# Patient Record
Sex: Female | Born: 1965 | Race: White | Hispanic: No | Marital: Married | Smoking: Former smoker
Health system: Southern US, Community
[De-identification: ages and names within clinical notes are randomized; demographics above are authoritative.]

## PROBLEM LIST (undated history)

## (undated) DIAGNOSIS — N393 Stress incontinence (female) (male): Secondary | ICD-10-CM

## (undated) DIAGNOSIS — N898 Other specified noninflammatory disorders of vagina: Secondary | ICD-10-CM

## (undated) DIAGNOSIS — B009 Herpesviral infection, unspecified: Secondary | ICD-10-CM

## (undated) DIAGNOSIS — I8390 Asymptomatic varicose veins of unspecified lower extremity: Secondary | ICD-10-CM

## (undated) DIAGNOSIS — G2581 Restless legs syndrome: Secondary | ICD-10-CM

## (undated) DIAGNOSIS — T7840XA Allergy, unspecified, initial encounter: Secondary | ICD-10-CM

## (undated) DIAGNOSIS — N816 Rectocele: Secondary | ICD-10-CM

## (undated) DIAGNOSIS — H269 Unspecified cataract: Secondary | ICD-10-CM

## (undated) DIAGNOSIS — K5900 Constipation, unspecified: Secondary | ICD-10-CM

## (undated) DIAGNOSIS — R0602 Shortness of breath: Secondary | ICD-10-CM

## (undated) DIAGNOSIS — K219 Gastro-esophageal reflux disease without esophagitis: Secondary | ICD-10-CM

## (undated) DIAGNOSIS — R102 Pelvic and perineal pain: Secondary | ICD-10-CM

## (undated) DIAGNOSIS — F329 Major depressive disorder, single episode, unspecified: Secondary | ICD-10-CM

## (undated) DIAGNOSIS — IMO0002 Reserved for concepts with insufficient information to code with codable children: Secondary | ICD-10-CM

## (undated) DIAGNOSIS — F32A Depression, unspecified: Secondary | ICD-10-CM

## (undated) DIAGNOSIS — E119 Type 2 diabetes mellitus without complications: Secondary | ICD-10-CM

## (undated) DIAGNOSIS — K649 Unspecified hemorrhoids: Secondary | ICD-10-CM

## (undated) DIAGNOSIS — G43909 Migraine, unspecified, not intractable, without status migrainosus: Secondary | ICD-10-CM

## (undated) HISTORY — DX: Constipation, unspecified: K59.00

## (undated) HISTORY — DX: Allergy, unspecified, initial encounter: T78.40XA

## (undated) HISTORY — DX: Type 2 diabetes mellitus without complications: E11.9

## (undated) HISTORY — DX: Gastro-esophageal reflux disease without esophagitis: K21.9

## (undated) HISTORY — DX: Rectocele: N81.6

## (undated) HISTORY — DX: Asymptomatic varicose veins of unspecified lower extremity: I83.90

## (undated) HISTORY — PX: REPLACEMENT TOTAL KNEE BILATERAL: SUR1225

## (undated) HISTORY — PX: ABDOMINAL HYSTERECTOMY: SHX81

## (undated) HISTORY — DX: Herpesviral infection, unspecified: B00.9

## (undated) HISTORY — PX: NISSEN FUNDOPLICATION: SHX2091

## (undated) HISTORY — DX: Unspecified hemorrhoids: K64.9

## (undated) HISTORY — DX: Other specified noninflammatory disorders of vagina: N89.8

## (undated) HISTORY — DX: Migraine, unspecified, not intractable, without status migrainosus: G43.909

## (undated) HISTORY — PX: CATARACT EXTRACTION: SUR2

## (undated) HISTORY — DX: Stress incontinence (female) (male): N39.3

## (undated) HISTORY — DX: Unspecified cataract: H26.9

## (undated) HISTORY — DX: Reserved for concepts with insufficient information to code with codable children: IMO0002

## (undated) HISTORY — PX: SEPTOPLASTY: SUR1290

## (undated) HISTORY — PX: OTHER SURGICAL HISTORY: SHX169

## (undated) HISTORY — PX: TUBAL LIGATION: SHX77

## (undated) HISTORY — PX: TONSILLECTOMY: SUR1361

## (undated) HISTORY — PX: BACK SURGERY: SHX140

## (undated) HISTORY — DX: Pelvic and perineal pain: R10.2

## (undated) HISTORY — PX: SINOSCOPY: SHX187

---

## 2001-03-05 ENCOUNTER — Ambulatory Visit (HOSPITAL_COMMUNITY): Admission: RE | Admit: 2001-03-05 | Discharge: 2001-03-05 | Payer: Self-pay | Admitting: General Surgery

## 2001-03-05 ENCOUNTER — Encounter: Payer: Self-pay | Admitting: General Surgery

## 2001-06-08 ENCOUNTER — Ambulatory Visit (HOSPITAL_COMMUNITY): Admission: RE | Admit: 2001-06-08 | Discharge: 2001-06-08 | Payer: Self-pay | Admitting: Pulmonary Disease

## 2001-08-03 ENCOUNTER — Encounter: Payer: Self-pay | Admitting: Orthopedic Surgery

## 2001-08-03 ENCOUNTER — Ambulatory Visit (HOSPITAL_COMMUNITY): Admission: RE | Admit: 2001-08-03 | Discharge: 2001-08-03 | Payer: Self-pay | Admitting: Orthopedic Surgery

## 2002-03-16 ENCOUNTER — Ambulatory Visit (HOSPITAL_COMMUNITY): Admission: RE | Admit: 2002-03-16 | Discharge: 2002-03-16 | Payer: Self-pay | Admitting: Internal Medicine

## 2002-05-18 ENCOUNTER — Ambulatory Visit (HOSPITAL_COMMUNITY): Admission: RE | Admit: 2002-05-18 | Discharge: 2002-05-18 | Payer: Self-pay | Admitting: Neurological Surgery

## 2002-05-18 ENCOUNTER — Encounter: Payer: Self-pay | Admitting: Neurological Surgery

## 2002-06-17 ENCOUNTER — Ambulatory Visit (HOSPITAL_BASED_OUTPATIENT_CLINIC_OR_DEPARTMENT_OTHER): Admission: RE | Admit: 2002-06-17 | Discharge: 2002-06-17 | Payer: Self-pay | Admitting: Specialist

## 2002-06-25 ENCOUNTER — Encounter (HOSPITAL_COMMUNITY): Admission: RE | Admit: 2002-06-25 | Discharge: 2002-07-25 | Payer: Self-pay | Admitting: *Deleted

## 2002-07-27 ENCOUNTER — Encounter (HOSPITAL_COMMUNITY): Admission: RE | Admit: 2002-07-27 | Discharge: 2002-08-26 | Payer: Self-pay | Admitting: *Deleted

## 2002-09-17 ENCOUNTER — Ambulatory Visit (HOSPITAL_COMMUNITY): Admission: RE | Admit: 2002-09-17 | Discharge: 2002-09-17 | Payer: Self-pay | Admitting: *Deleted

## 2002-09-17 ENCOUNTER — Encounter: Payer: Self-pay | Admitting: Specialist

## 2002-11-11 ENCOUNTER — Ambulatory Visit (HOSPITAL_COMMUNITY): Admission: RE | Admit: 2002-11-11 | Discharge: 2002-11-11 | Payer: Self-pay | Admitting: Pulmonary Disease

## 2002-12-15 ENCOUNTER — Encounter (HOSPITAL_COMMUNITY): Admission: RE | Admit: 2002-12-15 | Discharge: 2003-01-14 | Payer: Self-pay | Admitting: Pulmonary Disease

## 2003-09-01 ENCOUNTER — Ambulatory Visit (HOSPITAL_COMMUNITY): Admission: RE | Admit: 2003-09-01 | Discharge: 2003-09-01 | Payer: Self-pay | Admitting: Pulmonary Disease

## 2003-09-01 ENCOUNTER — Emergency Department (HOSPITAL_COMMUNITY): Admission: EM | Admit: 2003-09-01 | Discharge: 2003-09-01 | Payer: Self-pay | Admitting: Emergency Medicine

## 2003-09-30 ENCOUNTER — Ambulatory Visit (HOSPITAL_BASED_OUTPATIENT_CLINIC_OR_DEPARTMENT_OTHER): Admission: RE | Admit: 2003-09-30 | Discharge: 2003-09-30 | Payer: Self-pay | Admitting: Specialist

## 2003-10-06 ENCOUNTER — Encounter (HOSPITAL_COMMUNITY): Admission: RE | Admit: 2003-10-06 | Discharge: 2003-11-05 | Payer: Self-pay | Admitting: *Deleted

## 2003-12-08 ENCOUNTER — Ambulatory Visit (HOSPITAL_COMMUNITY): Admission: RE | Admit: 2003-12-08 | Discharge: 2003-12-08 | Payer: Self-pay | Admitting: Pulmonary Disease

## 2004-01-18 ENCOUNTER — Ambulatory Visit (HOSPITAL_COMMUNITY): Admission: RE | Admit: 2004-01-18 | Discharge: 2004-01-18 | Payer: Self-pay | Admitting: Pulmonary Disease

## 2004-12-19 ENCOUNTER — Ambulatory Visit: Payer: Self-pay | Admitting: Orthopedic Surgery

## 2004-12-20 ENCOUNTER — Encounter: Payer: Self-pay | Admitting: Orthopedic Surgery

## 2005-02-08 ENCOUNTER — Ambulatory Visit (HOSPITAL_COMMUNITY): Admission: RE | Admit: 2005-02-08 | Discharge: 2005-02-08 | Payer: Self-pay | Admitting: Obstetrics and Gynecology

## 2005-02-27 ENCOUNTER — Ambulatory Visit: Payer: Self-pay | Admitting: Orthopedic Surgery

## 2005-02-27 ENCOUNTER — Ambulatory Visit (HOSPITAL_COMMUNITY): Admission: RE | Admit: 2005-02-27 | Discharge: 2005-02-27 | Payer: Self-pay | Admitting: Obstetrics and Gynecology

## 2005-05-30 ENCOUNTER — Ambulatory Visit: Payer: Self-pay | Admitting: Orthopedic Surgery

## 2005-09-03 ENCOUNTER — Emergency Department (HOSPITAL_COMMUNITY): Admission: EM | Admit: 2005-09-03 | Discharge: 2005-09-03 | Payer: Self-pay | Admitting: Emergency Medicine

## 2005-12-12 ENCOUNTER — Ambulatory Visit: Payer: Self-pay | Admitting: Orthopedic Surgery

## 2006-02-17 ENCOUNTER — Ambulatory Visit (HOSPITAL_COMMUNITY): Admission: RE | Admit: 2006-02-17 | Discharge: 2006-02-17 | Payer: Self-pay | Admitting: Obstetrics and Gynecology

## 2006-02-20 ENCOUNTER — Encounter: Admission: RE | Admit: 2006-02-20 | Discharge: 2006-02-20 | Payer: Self-pay | Admitting: Pulmonary Disease

## 2006-04-24 ENCOUNTER — Ambulatory Visit: Payer: Self-pay | Admitting: Orthopedic Surgery

## 2006-07-03 ENCOUNTER — Ambulatory Visit: Payer: Self-pay | Admitting: Orthopedic Surgery

## 2006-07-15 ENCOUNTER — Ambulatory Visit (HOSPITAL_COMMUNITY): Admission: RE | Admit: 2006-07-15 | Discharge: 2006-07-15 | Payer: Self-pay | Admitting: Pulmonary Disease

## 2006-08-26 ENCOUNTER — Encounter: Payer: Self-pay | Admitting: Orthopedic Surgery

## 2006-08-26 ENCOUNTER — Encounter (INDEPENDENT_AMBULATORY_CARE_PROVIDER_SITE_OTHER): Payer: Self-pay | Admitting: Specialist

## 2006-08-26 ENCOUNTER — Inpatient Hospital Stay (HOSPITAL_COMMUNITY): Admission: RE | Admit: 2006-08-26 | Discharge: 2006-08-29 | Payer: Self-pay | Admitting: Orthopedic Surgery

## 2006-08-29 ENCOUNTER — Ambulatory Visit: Payer: Self-pay | Admitting: Orthopedic Surgery

## 2006-09-01 ENCOUNTER — Ambulatory Visit: Payer: Self-pay | Admitting: Orthopedic Surgery

## 2006-09-08 ENCOUNTER — Ambulatory Visit: Payer: Self-pay | Admitting: Orthopedic Surgery

## 2006-09-22 ENCOUNTER — Ambulatory Visit: Payer: Self-pay | Admitting: Orthopedic Surgery

## 2006-09-24 ENCOUNTER — Encounter (HOSPITAL_COMMUNITY): Admission: RE | Admit: 2006-09-24 | Discharge: 2006-10-24 | Payer: Self-pay | Admitting: Orthopedic Surgery

## 2006-10-23 ENCOUNTER — Ambulatory Visit: Payer: Self-pay | Admitting: Orthopedic Surgery

## 2006-10-28 ENCOUNTER — Encounter (HOSPITAL_COMMUNITY): Admission: RE | Admit: 2006-10-28 | Discharge: 2006-11-24 | Payer: Self-pay | Admitting: Orthopedic Surgery

## 2006-11-21 ENCOUNTER — Encounter (INDEPENDENT_AMBULATORY_CARE_PROVIDER_SITE_OTHER): Payer: Self-pay | Admitting: *Deleted

## 2006-11-21 ENCOUNTER — Inpatient Hospital Stay (HOSPITAL_COMMUNITY): Admission: RE | Admit: 2006-11-21 | Discharge: 2006-11-24 | Payer: Self-pay | Admitting: Orthopedic Surgery

## 2006-11-21 ENCOUNTER — Ambulatory Visit: Payer: Self-pay | Admitting: Orthopedic Surgery

## 2006-12-01 ENCOUNTER — Ambulatory Visit: Payer: Self-pay | Admitting: Orthopedic Surgery

## 2006-12-08 ENCOUNTER — Ambulatory Visit: Payer: Self-pay | Admitting: Orthopedic Surgery

## 2006-12-22 ENCOUNTER — Ambulatory Visit: Payer: Self-pay | Admitting: Orthopedic Surgery

## 2006-12-24 ENCOUNTER — Encounter (HOSPITAL_COMMUNITY): Admission: RE | Admit: 2006-12-24 | Discharge: 2007-01-23 | Payer: Self-pay | Admitting: Orthopedic Surgery

## 2006-12-29 ENCOUNTER — Ambulatory Visit: Payer: Self-pay | Admitting: Orthopedic Surgery

## 2007-01-30 ENCOUNTER — Ambulatory Visit: Admission: RE | Admit: 2007-01-30 | Discharge: 2007-01-30 | Payer: Self-pay | Admitting: Orthopedic Surgery

## 2007-02-23 ENCOUNTER — Ambulatory Visit (HOSPITAL_COMMUNITY): Admission: RE | Admit: 2007-02-23 | Discharge: 2007-02-23 | Payer: Self-pay | Admitting: Obstetrics and Gynecology

## 2007-02-25 ENCOUNTER — Ambulatory Visit (HOSPITAL_COMMUNITY): Admission: RE | Admit: 2007-02-25 | Discharge: 2007-02-25 | Payer: Self-pay | Admitting: Obstetrics and Gynecology

## 2007-03-02 ENCOUNTER — Ambulatory Visit: Payer: Self-pay | Admitting: Orthopedic Surgery

## 2007-03-26 ENCOUNTER — Ambulatory Visit: Payer: Self-pay | Admitting: Orthopedic Surgery

## 2007-04-01 ENCOUNTER — Ambulatory Visit (HOSPITAL_COMMUNITY): Admission: RE | Admit: 2007-04-01 | Discharge: 2007-04-01 | Payer: Self-pay | Admitting: Orthopedic Surgery

## 2007-04-01 ENCOUNTER — Encounter: Payer: Self-pay | Admitting: Orthopedic Surgery

## 2007-04-28 ENCOUNTER — Ambulatory Visit: Payer: Self-pay | Admitting: Orthopedic Surgery

## 2007-04-29 ENCOUNTER — Encounter: Payer: Self-pay | Admitting: Orthopedic Surgery

## 2007-07-07 DIAGNOSIS — J45909 Unspecified asthma, uncomplicated: Secondary | ICD-10-CM | POA: Insufficient documentation

## 2007-09-03 ENCOUNTER — Ambulatory Visit: Payer: Self-pay | Admitting: Orthopedic Surgery

## 2007-10-21 ENCOUNTER — Telehealth: Payer: Self-pay | Admitting: Orthopedic Surgery

## 2007-10-26 ENCOUNTER — Encounter: Payer: Self-pay | Admitting: Orthopedic Surgery

## 2008-01-05 ENCOUNTER — Ambulatory Visit (HOSPITAL_COMMUNITY): Admission: RE | Admit: 2008-01-05 | Discharge: 2008-01-05 | Payer: Self-pay | Admitting: Pulmonary Disease

## 2008-03-02 ENCOUNTER — Ambulatory Visit (HOSPITAL_COMMUNITY): Admission: RE | Admit: 2008-03-02 | Discharge: 2008-03-02 | Payer: Self-pay | Admitting: Obstetrics and Gynecology

## 2008-03-29 ENCOUNTER — Encounter: Payer: Self-pay | Admitting: Orthopedic Surgery

## 2009-02-28 ENCOUNTER — Encounter: Payer: Self-pay | Admitting: Orthopedic Surgery

## 2009-02-28 ENCOUNTER — Ambulatory Visit (HOSPITAL_COMMUNITY): Admission: RE | Admit: 2009-02-28 | Discharge: 2009-02-28 | Payer: Self-pay | Admitting: Pulmonary Disease

## 2009-03-08 ENCOUNTER — Ambulatory Visit (HOSPITAL_COMMUNITY): Admission: RE | Admit: 2009-03-08 | Discharge: 2009-03-08 | Payer: Self-pay | Admitting: Pulmonary Disease

## 2009-06-07 ENCOUNTER — Ambulatory Visit: Payer: Self-pay | Admitting: Orthopedic Surgery

## 2009-06-07 DIAGNOSIS — Z96659 Presence of unspecified artificial knee joint: Secondary | ICD-10-CM | POA: Insufficient documentation

## 2009-06-07 DIAGNOSIS — IMO0002 Reserved for concepts with insufficient information to code with codable children: Secondary | ICD-10-CM | POA: Insufficient documentation

## 2009-06-07 DIAGNOSIS — M1710 Unilateral primary osteoarthritis, unspecified knee: Secondary | ICD-10-CM

## 2009-06-22 ENCOUNTER — Telehealth: Payer: Self-pay | Admitting: Orthopedic Surgery

## 2009-12-26 ENCOUNTER — Ambulatory Visit (HOSPITAL_COMMUNITY): Admission: RE | Admit: 2009-12-26 | Discharge: 2009-12-26 | Payer: Self-pay | Admitting: Pulmonary Disease

## 2010-02-05 ENCOUNTER — Ambulatory Visit (HOSPITAL_COMMUNITY): Admission: RE | Admit: 2010-02-05 | Discharge: 2010-02-05 | Payer: Self-pay | Admitting: Pulmonary Disease

## 2010-04-02 ENCOUNTER — Ambulatory Visit (HOSPITAL_COMMUNITY): Admission: RE | Admit: 2010-04-02 | Discharge: 2010-04-02 | Payer: Self-pay | Admitting: Obstetrics and Gynecology

## 2010-06-13 ENCOUNTER — Ambulatory Visit: Payer: Self-pay | Admitting: Orthopedic Surgery

## 2010-12-27 NOTE — Assessment & Plan Note (Signed)
Summary: yrly xr tka/st bcbs/bsf   Visit Type:  Follow-up  CC:  bilateral knee replacement.  History of Present Illness: I saw [name] in the office today for a 1 year  followup visit.  She is a 45 years old woman with the complaint of:  bilateral knee replacements.  4 years post op   Right knee TKA 11/21/2006.  Left knee TKA 08/26/2006.  Routine followup no complaints patient says her knees are doing great   Allergies: 1)  ! Hydrocodone 2)  ! Pcn 3)  ! Sulfa 4)  ! * Nystatin 5)  ! * Iv Aminophylline 6)  ! Bactrim  Physical Exam  Additional Exam:  Normal Appearance, Oriented x 3, Mood normal  bilateral knees:  Inspection normal  Palpation normal  ROM 125 degrees  Stability was normal  Motor grade was 5 Skin was intact no rash     Impression & Recommendations:  Problem # 1:  TOTAL KNEE FOLLOW-UP (ICD-V43.65)  Orders: Est. Patient Level III (04540) Knees  x-ray bilateral ([id number])  Problem # 2:  KNEE, ARTHRITIS, DEGEN./OSTEO (ICD-715.96)  The following medications were removed from the medication list:    Ultram 50 Mg Tabs (Tramadol hcl) .Marland Kitchen... 1q4hrs  Orders: Est. Patient Level III (82956) Knees  x-ray bilateral ([id number])  Medications Added to Medication List This Visit: 1)  Topamax 200 Mg Tabs (topiramate)  .Marland Kitchen.. 1bid  Patient Instructions: 1)  Please schedule a follow-up appointment in 1 year. 2)  bilateral knee films in 1 year   Appended Document: yrly xr tka/st bcbs/bsf x-ray report bilateral knee films  The  alignment was normal, PTF reduced without tilt or subluxation, no evidence of loosening.  IMPRESSION: normal appearance of the implant

## 2011-02-26 ENCOUNTER — Ambulatory Visit (HOSPITAL_COMMUNITY)
Admission: RE | Admit: 2011-02-26 | Discharge: 2011-02-26 | Disposition: A | Discharge status: Home / Self Care | Payer: BC Managed Care – PPO | Source: Ambulatory Visit | Attending: Pulmonary Disease | Admitting: Pulmonary Disease

## 2011-02-26 ENCOUNTER — Other Ambulatory Visit (HOSPITAL_COMMUNITY): Payer: Self-pay | Admitting: Pulmonary Disease

## 2011-02-26 DIAGNOSIS — M549 Dorsalgia, unspecified: Secondary | ICD-10-CM

## 2011-02-26 DIAGNOSIS — M545 Low back pain, unspecified: Secondary | ICD-10-CM | POA: Insufficient documentation

## 2011-04-12 NOTE — H&P (Signed)
NAMEKORY, PANJWANI                ACCOUNT NO.:  [id number]   MEDICAL RECORD NO.:  [id number]          PATIENT TYPE:  AMB   LOCATION:  DAY                           FACILITY:  [facility]   PHYSICIAN:  Vickki Hearing, M.D.DATE OF BIRTH:  [birth year]   DATE OF ADMISSION:  DATE OF DISCHARGE:  LH                                HISTORY & PHYSICAL   CHIEF COMPLAINT:  Left knee pain.   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  Unfortunately, her knees have failed her at an early age.  She complains of severe pain which has been unrelieved by anti-inflammatory  medications which included Naprosyn, Arthrotec other various anti-  inflammatories.  She developed GI bleeding from anti-inflammatories even on  Arthrotec.  She was treated with surgical arthroscopy at which time it was  noted that she had bone to bone changes in [id number].  At that time Dr.  Thomasena Edis did a left knee arthroscopy and found grade III changes and IV  changes on the femoral trochlea, grade III changes medial compartment, grade  II changes in the patella.  After this she had several cortisone injections  and I believe also had Synvisc injections.  She was treated with an unloader  arthritis brace and still failed therapy.  She was treated with Ultracet and  on occasion Vicodin for pain and has failed all of these treatments.  On the  most recent film that we have on her knee she had a varus deformity mild to  moderate with complete loss of joint space on the medial side with  osteophyte formation. I did get Dr. Thomasena Edis to look at her again and review  her situation and he agreed with me that despite her age of 5 the best  surgical treatment for her at this time would be knee replacement.  Interestingly her right knee is just as bad, however, based on her pulmonary  problems we have opted to do one knee at a  time.   She has seen Dr. Juanetta Gosling in consultation regarding her respiratory illnesses  in the past.  She has had pulmonary function tests and has essentially been  cleared for surgery.   This patient has undergone the informed consent process.  We have discussed  her diagnosis of osteoarthritis, treatment plan for total knee replacement  of the right knee, alternative treatments which have been tried and could be  continued such as bracing and injections, risks and benefits of the surgery.  Primary benefit is pain relief.  Secondary benefit though less reliable  increased function and motion in the knee.  She understands the finite life  span of these implants and understands that she may need one or two  revisions in her lifetime.  She understands that she will need to be out of  work for a significant amount of time which can only be estimated and not  guarantee.  She also understands that she will need approximately 12 weeks  before the  second knee can be done.   ALLERGIES:  THE PATIENT IS ALLERGIC TO PENICILLIN, SULFONAMIDES, IV  AMINOPHYLLIN AND NYSTATIN.   PAST MEDICAL HISTORY:  1. Migraines.  2. Sinusitis.  3. Depression.   CURRENT MEDICATIONS:  1. Topamax 100 b.i.d.  2. Wellbutrin.  3. She has been treated in the past with steroids for respiratory disease,      which increases her risk of infection.   PAST SURGICAL HISTORY:  She has had surgery in the past which included:  1. Septoplasty.  2. Tonsillectomy.  3. Wisdom tooth extraction,.  4. Bilateral knee arthroscopies.  5. Cesarean section.  6. LTC.  7. Hysterectomy.  8. Laparoscopic Nissen fundoplication.   REVIEW OF SYSTEMS:  She denied general malaise, heart disease, GI problems  other than the recent bleed, urinary, endocrine, skin, ENT, lymph system  disease.  The other systems are listed in the history section.   FAMILY HISTORY:  She has a family history of arthritis and asthma.   SOCIAL HISTORY:  She is  married, a Designer, jewellery at [employer].  She does not smoke.  Alcohol use; perhaps five to six beers per week.  Caffeine use; one to two cups per day.  Educational level; high degree,  bachelor's degree completed recently.   PHYSICAL EXAMINATION:  GENERAL APPEARANCE: She is well-developed and  nourished, grooming and hygiene normal, frame normal.  CARDIOVASCULAR:  No swelling.  Varicose veins minimal, nonsymptomatic.  Pulses are normal.  Temperature normal.  No edema or tenderness.  Cervical  lymph nodes were benign.  HEART:  Rate and rhythm are normal.  CHEST:  Clear.  ABDOMEN:  Soft.  MUSCULOSKELETAL:  Show gait and station are remarkable for decreased stride,  length and cadence.  Inspection reveals bilateral medial compartment  tenderness with pain and crepitance.  Range of motion remains approximately  0 to 120 degrees.  The knees are stable with mild varus deformity.  Muscle  strength and tone are normal.  UPPER EXTREMITIES: Normal alignment, range of motion, strength, stability.  SKIN:  Without significant scars, rashes, lesions, ulcers or cafe au lait  spots.  NEUROPSYCH:  Shows normal reflexes, sensation, coordination, orientation.  Mood and affect pleasant.   Again radiograph was done 07/03/2006.  Medial compartment arthritis is noted  with complete loss of joint space, osteophyte formation and varus deformity.   DIAGNOSIS:  Osteoarthritis left knee.   PLAN:  left total knee replacement.   Pulmonary function tests are in the [facility] computerized records  summation.  Arterial blood gas was normal.  Bone volumes were normal.  Spirometry showed no ventilatory defects, but showed some evidence of air  flow obstructions.  The DLCO was normal.   ADDENDUM REGARDING HER MEDICATIONS:  1. Topamax 100 mg b.i.d.  2. Aciphex 20 mg daily.  3. Advair 250/50 1 puff a day.  4. Albuterol average once a day. 5. Singulair 10 mg daily.  6. Allegra 180 mg daily.  7.  Wellbutrin XR 150 mg b.i.d.  8. Hydrocodone caused itching and she does understand that based on her      allergies she may have some difficulty with pain management as we may      be limited as to what type of medicines that she can take. copy [facility] and Dr. Jerene Pitch, M.D.  Electronically Signed     SEH/MEDQ  D:  08/22/2006  T:  08/22/2006  Job:  [id number]   cc:   Ramon Dredge L. Juanetta Gosling, M.D.  Fax: [phone]

## 2011-04-12 NOTE — Procedures (Signed)
[name], [name]                ACCOUNT NO.:  [id number]   MEDICAL RECORD NO.:  [id number]          PATIENT TYPE:  OUT   LOCATION:  RESP                          FACILITY:  [facility]   PHYSICIAN:  Edward L. Juanetta Gosling, M.D.DATE OF BIRTH:  July 26, 1966   DATE OF PROCEDURE:  07/15/2006  DATE OF DISCHARGE:                              PULMONARY FUNCTION TEST   1. Spirometry does not show a ventilatory defect but does show evidence of      airflow obstruction.  2. Lung volumes were normal.  3. DLCO was normal.  4. Arterial blood gases were normal.  5. This is consistent with a clinical diagnosis of asthma.      Edward L. Juanetta Gosling, M.D.  Electronically Signed     ELH/MEDQ  D:  07/15/2006  T:  07/15/2006  Job:  [id number]   cc:   Jayme Cloud, Dr.  Otilio Saber  Operating Room   Vickki Hearing, M.D.  Fax: [phone]

## 2011-04-12 NOTE — Procedures (Signed)
NAMEYASAMIN, KAREL                ACCOUNT NO.:  [id number]   MEDICAL RECORD NO.:  [id number]         PATIENT TYPE:  RESP   LOCATION:  RAD                           FACILITY:  [facility]   PHYSICIAN:  Edward L. Juanetta Gosling, M.D.DATE OF BIRTH:  25-Nov-1966   DATE OF PROCEDURE:  01/08/2008  DATE OF DISCHARGE:  01/05/2008                            PULMONARY FUNCTION TEST   1. Spirometry shows no definite ventilatory defect, but evidence of      air flow obstruction.  2. Lung volumes are normal.  3. DLCO is normal.  4. There is no significant bronchodilator improvement.      Edward L. Juanetta Gosling, M.D.  Electronically Signed     ELH/MEDQ  D:  01/08/2008  T:  01/09/2008  Job:  [id number]

## 2011-04-12 NOTE — Discharge Summary (Signed)
NAMEGAYNEL, [name]                ACCOUNT NO.:  [id number]   MEDICAL RECORD NO.:  [id number]          PATIENT TYPE:  INP   LOCATION:  A308                          FACILITY:  [facility]   PHYSICIAN:  Vickki Hearing, M.D.DATE OF BIRTH:  1966/03/30   DATE OF ADMISSION:  11/21/2006  DATE OF DISCHARGE:  12/31/2007LH                               DISCHARGE SUMMARY   PREOPERATIVE DIAGNOSIS:  Osteoarthritis right knee.   POSTOPERATIVE DIAGNOSIS:  Osteoarthritis right knee.   PROCEDURE:  Right total knee, DePuy rotating platform.  Surgeon:  Dr.  Fuller Canada.  Anesthetic:  Spinal.  Blood loss:  Minimal.  Operative findings included complete denuded cartilage on the medial  femoral condyle and the anterior half of the tibial plateau.  Also,  lateral trochlea was noted to be denuded of cartilage down to the bone.  There was mild varus deformity as well.   HOSPITAL COURSE:  The patient was admitted, had uncomplicated surgery,  was admitted to the floor, started routine total knee protocol, had no  complications.  Hemoglobin remained steady at 12.  Her gait progressed  as expected.  Range of motion was approximately 60 degrees with full  extension on passive testing.  She was discharged on Coumadin, started  on a CPM, advance 10 degrees per day.  Followup scheduled within a week  to have staples removed and she was discharged on Percocet, Skelaxin as  well.  She was to resume all her preoperative medications.   OVERALL CONDITION:  Improved.      Vickki Hearing, M.D.  Electronically Signed     SEH/MEDQ  D:  11/28/2006  T:  11/28/2006  Job:  [id number]

## 2011-04-12 NOTE — Op Note (Signed)
NAMEBENNY, [name]                ACCOUNT NO.:  [id number]   MEDICAL RECORD NO.:  [id number]          PATIENT TYPE:  AMB   LOCATION:  DAY                           FACILITY:  [facility]   PHYSICIAN:  Vickki Hearing, M.D.DATE OF BIRTH:  02/24/1966   DATE OF PROCEDURE:  11/21/2006  DATE OF DISCHARGE:                               OPERATIVE REPORT   HISTORY:  This is a 45 year old female status post left total knee  replacement using a DePuy rotating platform knee system, presenting for  a right total knee secondary to disabling right knee pain unresponsive  to medical treatment, surgical arthroscopy, and bracing.   PREOPERATIVE DIAGNOSIS:  Osteoarthritis right knee.   POSTOPERATIVE DIAGNOSIS:  Osteoarthritis right knee.   PROCEDURE:  Right total knee replacement.   IMPLANTS:  DePuy rotating platform, 2.5 tibia, 2.5 femur, 32 patella, 10  mm polyethylene rotating platform insert.   SURGEON:  Vickki Hearing, M.D.   ASSISTANT:  Elbert Nation, RNFA.   OPERATIVE FINDINGS:  There was complete loss of cartilage in the medial  compartment, especially in the medial femoral condyle throughout its  entire course, also over the anterior half of the tibial plateau, and in  the medial half of the trochlea. The lateral compartment was in very  good condition.   There was also a significant amount of synovial hypertrophy, and some  mild bone spurs were noted medially, femur and tibia.   The patient was identified in the preoperative holding area as [name].  Her right knee was marked for surgery by the patient and  countersigned by the surgeon.  History and physical updated.  The  patient was taken to the operating room, where a spinal anesthetic was  administered, after which vancomycin was started. After inserting a  Foley catheter and prepping the knee with DuraPrep, sterile draping was  done, and the time-out procedure was completed.  The procedure was  confirmed as a  right total knee replacement.  The patient was confirmed  as [name]. Antibiotics confirmed to be given within an hour of  skin incision.  Allergies also confirmed.   The tourniquet was elevated to 350 mmHg. Prior to this, the limb was  exsanguinated with a 6-inch Esmarch. With the knee in flexion, a  straight incision was made over the patella, extended proximally and  distally down to the extensor mechanism.  A medial arthrotomy was  performed. The patella was everted.  A medial release was done to the  midportion of the tibia.  The tibia was subluxated forward, and the  anterior and posterior cruciate ligaments were resected. The lateral  meniscus and medial meniscus were resected.   Tibial guide was set for neutral cut. The coronal alignment was moved  medially to account for the lateral malleolus.  10 mm of bone was  resected referencing from the lateral side.  A tibial base plate was  used to measure a 2.5 tibia.   A drill was introduced into the intramedullary canal.  The canal was  suctioned and irrigated twice. The distal femoral cut was  set for 11 mm,  with a 5-degree valgus angle. The distal cut was made.  A 10 mm  extension block was placed, and full extension was obtained. Collateral  ligaments were already balanced.   The femur was measured to a size 2.5.  A 4-in-1 cutting block pinned,  and four cuts were made. Flexion gap checked.  10 mm block with  extension shim removed, gave good balance and good flexion.   The box cut was made. Patella was measured to a 22 and resected down to  a 12t.  A 32 mm button was sized appropriately, and three peg holes were  drilled.  We then did a trial reduction. We noted normal patellofemoral  tracking with a no-touch technique.  Passive flexion was 120 degrees.  Extension was -5 degrees.  At 90 degrees, there was excellent collateral  stability, AP stability, and in extension collateral ligaments were  balanced.   Thick trial  components were removed. The tibia was prepared for final  implant. The knee was irrigated.  Cement was mixed. The components were  then cemented in place.  Cement was allowed to cure with a 10 mm trial  insert in place.  We then placed the normal inserted 10 mm polyethylene  and checked range of motion.  We were able to obtain the same motion as  noted on the trials, and we then closed the knee.   We first injected 60 cc of Marcaine with epinephrine 0.5%.  We then  closed over a pain pump catheter with a 4 mm per hour basilar rate. We  used #1 Bralon and closed the knee in flexion.  We used interrupted and  running sutures.  We then closed the subcutaneous layer with 0 Monocryl  and 2-0 Monocryl, and final skin closure with staples.  We dressed the  leg from toes to groin.  We applied bandages with an Ace wrap as well,  and then we placed a Cryo Cuff, then released the tourniquet.  The  patient was taken to the recovery room in stable condition.  Postop plan  is for routine total knee recovery protocol.      Vickki Hearing, M.D.  Electronically Signed     SEH/MEDQ  D:  11/21/2006  T:  11/21/2006  Job:  [id number]

## 2011-04-12 NOTE — Group Therapy Note (Signed)
[name], [name]                ACCOUNT NO.:  [id number]   MEDICAL RECORD NO.:  [id number]          PATIENT TYPE:  REC   LOCATION:  REH                           FACILITY:  [facility]   PHYSICIAN:  Mila Homer. Sudie Bailey, M.D.DATE OF BIRTH:  17-Jul-1966   DATE OF PROCEDURE:  DATE OF DISCHARGE:                                 PROGRESS NOTE   This is a 45 year old patient of Dr. Shaune Pollack.  Did have total knee  done yesterday with Dr. Romeo Apple.  She then developed vaginal itching  last night and it became worse this morning. She says she has had a  problem chronic recurrent vaginal yeast infections.  She has used  Diflucan in the past, and also Production manager.  Anytime she gets antibiotics  it flares up, and of course, she has had antibiotics around the time of  her surgery.   ALLERGIES:  1. PENICILLIN, .  2. SULFONAMIDES.  3. IV AMINOPHYLLIN.  4. NYSTATIN.  5. SHE HAS HAD ITCHING WITH HYDROCODONE, but oxycodone is tolerated.   No other immediate medical problems, although on antiinflammatories  recently she had a GI bleed.   OBJECTIVE:  Temperature is 98.4.  Pulse 94.  Respiratory rate 20.  Blood  pressure 127/90.  02 saturation 97% on room air.  She really is in no  acute distress.   ASSESSMENT:  Yeast vaginitis by history.   PLAN:  Diflucan 100 mg p.o. daily and Terazol-7 vaginal cream,  applicator full to the vagina daily.  Discussed with patient.      Mila Homer. Sudie Bailey, M.D.  Electronically Signed     SDK/MEDQ  D:  11/22/2006  T:  11/22/2006  Job:  [id number]

## 2011-04-12 NOTE — Discharge Summary (Signed)
[name], [name]                ACCOUNT NO.:  [id number]   MEDICAL RECORD NO.:  [id number]          PATIENT TYPE:  INP   LOCATION:  A308                          FACILITY:  [facility]   PHYSICIAN:  Vickki [name], M.D.DATE OF BIRTH:  May 04, 1966   DATE OF ADMISSION:  08/26/2006  DATE OF DISCHARGE:  10/05/2007LH                                 DISCHARGE SUMMARY   ADMISSION DIAGNOSIS:  Osteoarthritis, left knee.   DISCHARGE DIAGNOSIS:  Osteoarthritis, left knee.   OPERATION/PROCEDURE:  On August 26, 2006 this patient underwent a left total  knee arthroplasty with the following components.  We did a DePuy posterior  stabilized rotating platform implant with SmartSet bone cement, 2.5 femur,  2.5 tibia. Oval domed 32 mm patella with a 10 mm rotating platform  polyethylene insert.  Procedure was done under spinal anesthetic and well-  tolerated.   HISTORY:  The patient presented with the chief complaint of left knee pain.  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  Unfortunately for her, her  knees failed her at an early age.  She complained of severe pain unrelieved  by anti-inflammatory medications including Naprosyn and Arthrotec.  Various  others were also tried.  On Arthrotec, she developed GI bleeding which was  documented by Hemoccult blood tests.  The medicine was stopped.  The patient  was also treated by Dr. Thomasena [name] in [place] with surgical arthroscopy at  which time she was noted to have bone on bone changes as late as July 2003.  Cortisone injections and Synvisc injections were used after the arthroscopy.  An Unloader brace was tried and she continued to have pain.  She was put on  Ultracet as well, then switched to Vicodin and failed all treatments.  I did  get Dr. Thomasena [name] to see her again as a second opinion and he agreed with me  that despite her age of 55, the best treatment for her was  surgical with the  knee replacement.   HOSPITAL COURSE:  The patient was admitted on August 26, 2006. Underwent  uncomplicated left total knee replacement.  She started our protocol for  total knee replacement and did well.  She complained of increased swelling  of the knee on August 29, 2006 and was worked up with an ultrasound which  was negative for deep vein thrombosis.  At the time of discharge, she was in  stable condition.  Her range of motion was 5 to 70.  Her CPM was at 70  degrees and she walked 80 feet with standby assist.  She was, therefore,  discharged.   DISCHARGE MEDICATIONS:  1. Wellbutrin 150 mg twice a day.  2. Singulair 10 mg at night.  3. Advair 250/50 mg inhaler b.i.d.  4. Topamax 100 mg one and a half tablets twice a day or 75 mg twice a day.  5. Frova 2.5 mg p.r.n. for migraines.  6. Sudafed 120 mg daily.  7. Ventolin 90 mcg inhaler q.4 h. p.r.n. as needed.   1. In  addition, postoperative pain relief, she was given Percocet one q.4      h. p.r.n.  for pain.  2. For DVT prevention, she was kept on Lovenox 30 mg b.i.d. for the      balance of seven days.   ALLERGIES:  PENICILLIN, SULFONAMIDES, IV AMINOPHYLLIN, NYSTATIN.   PAST SURGICAL HISTORY:  1. Septoplasty.  2. Tonsillectomy.  3. Wisdom tooth extraction.  4. Right knee arthroscopy.  5. Cesarean section, low transverse.  6. Hysterectomy.  7. Laparoscopic Nissen fundoplication.   The patient is scheduled for physical therapy at home, home health, home  CPM.  Her staples can come out on October 14.  She is full weightbearing.   FOLLOW UP:  Followup visit has been scheduled for September 08, 2006.  The  patient is out of work from October 2 until six weeks time has elapsed.      Vickki [name], M.D.  Electronically Signed     SEH/MEDQ  D:  09/28/2006  T:  09/29/2006  Job:  [id number]

## 2011-04-12 NOTE — Group Therapy Note (Signed)
NAMEJURLINE, FOLGER                ACCOUNT NO.:  [id number]   MEDICAL RECORD NO.:  [id number]          PATIENT TYPE:  INP   LOCATION:  A308                          FACILITY:  [facility]   PHYSICIAN:  Edward L. Juanetta Gosling, M.D.DATE OF BIRTH:  1966-08-31   DATE OF PROCEDURE:  08/28/2006  DATE OF DISCHARGE:                                   PROGRESS NOTE   SUBJECTIVE:  Ms. Neth has had her knee replaced by Dr. Romeo Apple.  She  says she is feeling better.  She is not having any problems with her asthma,  but she still is having what she thinks is a pill esophagitis.   OBJECTIVE:  CHEST:  Clear.  HEART:  Regular.  ABDOMEN:  Soft.   ASSESSMENT:  She is continuing to have symptoms at least of a pill  esophagitis.   PLAN:  I will see how she does this evening.  She is still on Carafate.  If  it is not clear, we will probably need to get a GI consult at that point.      Edward L. Juanetta Gosling, M.D.  Electronically Signed     ELH/MEDQ  D:  08/28/2006  T:  08/29/2006  Job:  [id number]

## 2011-04-12 NOTE — Group Therapy Note (Signed)
NAMEZARIANNA, DICARLO                ACCOUNT NO.:  [id number]   MEDICAL RECORD NO.:  [id number]          PATIENT TYPE:  INP   LOCATION:  A308                          FACILITY:  [facility]   PHYSICIAN:  Edward L. Juanetta Gosling, M.D.DATE OF BIRTH:  Nov 25, 1966   DATE OF PROCEDURE:  08/29/2006  DATE OF DISCHARGE:  08/29/2006                                   PROGRESS NOTE   Ms. Halperin is had her knee replaced, and she, of course, has asthma.  She  has had what is probably a pill esophagitis.  She says she is feeling  better, but today is just not a good day.  She generally feels weak, she has  had more pain.   EXAM:  VITAL SIGNS:  Temperature is 98.2, pulse 121, respirations 20, blood  pressure 127/82, O2 sats 97% on room air.  She is getting 2500 on her  incentive spirometry. Hemoglobin 11.5, white count 10,800.  She is mildly  hyponatremic.   ASSESSMENT:  She seems to be doing relatively well at least from a  respiratory standpoint.  Her chest is quite clear.   PLAN:  Plan is to continue with her medications and follow.      Edward L. Juanetta Gosling, M.D.  Electronically Signed     ELH/MEDQ  D:  08/29/2006  T:  08/30/2006  Job:  [id number]

## 2011-04-12 NOTE — Op Note (Signed)
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  She was treated with oral anti-  inflammatories, Ultracet, Vicodin, Arthrotec as well as knee arthroscopy,  unloader osteoarthritis bracing, intra-articular injections and failed  treatment.  She had persistent and severe pain.  She actually had GI  bleeding while she was on Arthrotec and it had to be discontinued.   PRIMARY INDICATION FOR SURGERY:  Pain and failure of conservative therapy.   PREOPERATIVE DIAGNOSIS:  Osteoarthritis, left knee.   POSTOPERATIVE DIAGNOSIS:  Osteoarthritis, left knee.   PROCEDURE:  Left total knee implant, DePuy posterior stabilized rotating  platform, sizes 2.5 femur, 2.5 tibia, 32 patella, size 10 posterior  stabilized RP insert.   We also inserted a Hemovac drain in the joint and a subcu pain pump catheter  using Sensorcaine.   OPERATIVE FINDINGS:  There was complete loss of cartilage on the medial  compartment, medial trochlea, medial patella.   DETAILS OF PROCEDURE:  Ms. Connole was identified in the preop holding area.  She marked her left knee as the surgical site.  It was countersigned by me  the surgeon.  I updated her history and physical.  On the way back to the  operating room we started her vancomycin.  She had a spinal anesthetic by  Dr. Jayme Cloud, it was tolerated well.  She had a Foley catheter placed in the  supine position.  Her left leg was prepped and draped with sterile  technique.   We completed the time-out procedure, confirmed the procedure as a left total  knee replacement on [name].  We confirmed her allergies and antibiotic  administration.   The leg was exsanguinated with a 6-inch Esmarch.  Tourniquet was inflated  with the knee in flexion to 300 mmHg where it stayed for an hour and 36  minutes.   With the knee flexed, a straight incision was made over the knee, centered  over the patella, extended proximally and distally down to the extensor  mechanism.  Medial arthrotomy was completed.  The patella was everted.  Menisci were resected.  ACL and PCL were removed as well.  The tibia was  subluxated forward and a tibial guide was set for a 0 degrees cut using a 10  mm reference from the lateral normal side.   After completing this bone resection, a drill bit was used to enter the  femoral canal.  The femoral canal was decompressed with suction and  irrigation.  A 5 degrees left distal femoral cutting block was secured to  the distal femur set at 11 mm resection and this resection was completed.  The extension gap was  checked with a 10 mm extension block and an alignment  rod and this was found to be an excellent position.   We then sized the femur to a 2.5, set the femoral rotation to external  rotation and then placed a laminar spreader in the flexion space and  confirmed that we had a trapezoid.  We then placed the 4 in 1 cutting block,  made the four bone resections and checked the flexion gap.  The flexion gap  had an excellent fit with a 10-mm insert.  We then made the box cut,  completed the patellar bone resection from a size 20 to a size 12, made the  three peg holes with the drill and then performed a trial reduction with  trial components.  There was no patellar tilt or lift-off.  There was  excellent passive flexion of the knee to 115 degrees.  Full extension was  noted and we drilled the lug holes for the femur, completed the tibial  preparation with a reamer and a triangular punch.  We irrigated the bone  surfaces,  dried them and then cemented our implants in place with a 10  trial.  We allowed the cement to cure, we removed the excess cement and then  repeated a trial range of motion and found the patella to be tracking  normally with a no-touch technique.  We were put able to obtain full  extension and passive flexion was 115 degrees.  With manual flexion, we were  able to obtain 125 degrees.  There was no dislocation or subluxation of the  rotating platform tray.   We then placed a normal 10 tray repeated our range of motion assessment  found it to be equal to the trial and then started closure.   After irrigation we closed over suction drain with #1 Bralon with the knee  in flexed position.  We closed the subcu with 0-0 and 2-0 Monocryl and the  skin with staples.  We closed the subcu over a pain pump catheter.   We activated the catheter, applied a sterile dressings, cryo cuff and then  released the tourniquet and applied an Ace bandage from foot to groin with  the cryo cuff and took the patient to recovery room in stable condition.   Radiographs have been checked, alignment and position of the prosthesis is  confirmed.   The patient will be on a routine postop total knee protocol.      Vickki Hearing, M.D.  Electronically Signed     SEH/MEDQ  D:  08/26/2006  T:  08/27/2006  Job:  [id number]   cc:   Ramon Dredge L. Juanetta Gosling, M.D.  Fax: [phone]   Erasmo Leventhal, M.D.  Fax: [phone]

## 2011-04-12 NOTE — H&P (Signed)
[name], [name]                ACCOUNT NO.:  [id number]   MEDICAL RECORD NO.:  [id number]          PATIENT TYPE:  AMB   LOCATION:  DAY                           FACILITY:  [facility]   PHYSICIAN:  Vickki Hearing, M.D.DATE OF BIRTH:  03-May-1966   DATE OF ADMISSION:  DATE OF DISCHARGE:  LH                              HISTORY & PHYSICAL   CHIEF COMPLAINT:  Right knee pain.   HISTORY:  This is a 45 year old female status post a left total knee  replacement using a DePuy rotating platform knee system.  That surgery  was done on August 26, 2006.  She has recovered from that surgery and  wishes to have the right knee done as well.  She complains of primarily  global right knee pain, more concentrated on the medial compartment.  She requires Percocet for pain relief for the right knee.  She is  walking unsupported at this time.  Regarding the right  there has been  no instability, catching, locking or giving way, but there has been  significant crepitance and stiffness.  Preoperative range of motion  right knee limited to -5 to 110.   The patient is followed by Dr. Juanetta Gosling.  She has a history of migraines,  sinusitis, depression.  She takes Topamax, Wellbutrin, Percocet.  Previous surgeries include a septoplasty, tonsillectomy, wisdom tooth  extraction, bilateral knee arthroscopy, cesarean section, LTC,  hysterectomy, and laparoscopic Nissen fundoplication.   ALLERGIES:  SHE IS ALLERGIC TO PENICILLIN, SULFONAMIDES, IV  AMINOPHYLLINE, AND NYSTATIN.  HYDROCODONE CAUSED ITCHING BUT PERCOCET IS  TOLERATED.   REVIEW OF SYSTEMS:  She denies general malaise, heart disease, GI  problems, but she did have a recent GI bleed which caused her to have to  stop her anti-inflammatories.  She was on a proton pump inhibitor during  this period.  She denies any urinary, endocrine, skin, ear, nose and  throat, or lymphatic symptom disease.  Her major medical problem has  been respiratory disease  which did require steroid treatment in the  past.  She is again followed by Dr. Juanetta Gosling for that.   FAMILY HISTORY:  Arthritis and asthma.   SOCIAL HISTORY:  She is married.  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  She does not smoke.  Alcohol use is limited to 5 or 6 beers  per week.  Caffeine use limited to 1 to 2 cups of coffee per day.  She  has a bachelor's degree in nursing.   She is well-developed and nourished.  Grooming and hygiene are normal.  Frame is normal.  VITAL SIGNS :  Stable.  They will be repeat in preop.  HEAD, EYES, EARS, NOSE AND THROAT:  No major abnormalities.  NECK:  Supple without lymphadenopathy.  CHEST:  Clear.  HEART:  Rate and rhythm are normal.  Pulses are normal with no peripheral edema, tenderness, swelling,  varicose veins or temperature changes.  GI:  A normal, soft abdomen.  MUSCULOSKELETAL:  An altered gait.  On the left side she has a typical  total knee gait with decreased flexion and stride length  and cadence.  On the right she is favoring the right leg with a limp.  Range of motion  in the right knee is -5 to 110.  The knee is stable.  There is varus  deformity.  She has excellent muscle strength and muscle tone in that  limb.  UPPER EXTREMITIES:  Exhibit good strength.  No instability, subluxation,  dislocation, contracture, tenderness, mass or effusion.  SKIN:  The operative left knee has healed well without abnormality.   The reflexes, coordination tests, sensation exam is normal.  She is  oriented x3.  Mood is pleasant.   IMPRESSION:  Osteoarthritis right knee.   PLAN:  Right total knee replacement, again with a DePuy rotating  platform.   ADDENDUM  The informed consent process was completed.  The patient understands  that she has high risk of early failure due to her age and compromise of  her bone due to chronic steroid use.  However, she is at end-stage in  terms of pain and nonoperative treatments which have been tried   including alternative therapies of arthroscopy, anti-inflammatories  which cause GI bleeding, even with protection of proton pump inhibitor  and misoprostol.  She also used unloaded braces and various pain  medications.      Vickki Hearing, M.D.  Electronically Signed     SEH/MEDQ  D:  11/20/2006  T:  11/20/2006  Job:  [id number]

## 2011-04-12 NOTE — Consult Note (Signed)
NAMEJARELIS, [name]                ACCOUNT NO.:  [id number]   MEDICAL RECORD NO.:  [id number]          PATIENT TYPE:  INP   LOCATION:  A308                          FACILITY:  [facility]   PHYSICIAN:  Edward L. Juanetta Gosling, M.D.DATE OF BIRTH:  10-04-1966   DATE OF CONSULTATION:  DATE OF DISCHARGE:                                   CONSULTATION   REASON FOR CONSULTATION:  Asthma.   HISTORY:  [name] is a 45 year old who had a knee replacement secondary  to osteoarthritis today.  She has had severe pain.  She has been on multiple  medications. She developed GI bleeding from anti-inflammatories.  She had  undergone multiple evaluations. Dr. Romeo Apple had seen her and then sent her  for a second opinion to Dr. Thomasena Edis who agree that she did need a knee  replacement. The second opinion was because of her very young age for knee  replacement.  She has a history of previous asthma and she actually had a  single episode of being sick enough to be intubated and placed on mechanical  ventilation.  She has been seen in my office prior to surgery and cleared  for surgery and had pulmonary function tests which were pretty good.   PAST MEDICAL HISTORY:  Positive for migraines, sinusitis, depression and  asthma.  She is allergic to PENICILLIN, SULFONAMIDES, AMINOPHYLLINE and  MYCOSTATIN.  She takes Topamax 100 mg b.i.d., Wellbutrin. She is on Advair  250/50 and a Ventolin metered-dose inhaler.   PAST SURGICAL HISTORY:  She has had a septoplasty, tonsillectomy,  arthroscopies, C section, LTC, hysterectomy, and a Nissen fundoplication  which has helped with her asthma.   FAMILY HISTORY:  Positive for arthritis, positive for asthma.   REVIEW OF SYSTEMS:  Otherwise is pretty much negative.  She has not had any  fever, chills, nausea, vomiting, or diarrhea.   SOCIAL HISTORY:  She is a nonsmoker.  Takes about one beer per day, works as  a Engineer, civil (consulting).   PHYSICAL EXAM:  GENERAL:  Shows a well-developed female  who is in no acute  distress.  She is postop and has several infusion pumps. Her knee is covered  and I did examine it.  HEENT:  Her pupils are reactive to light and accommodation.  Nose and throat  are clear.  NECK:  Her neck is supple without masses.  She does not have any bruits.  She does not have any jugular venous distension.  CHEST:  Her chest is clear without wheezes, rales or rhonchi.  HEART:  Regular without murmur, gallop or rub.  ABDOMEN:  Her abdomen is soft.  No masses are felt.  EXTREMITIES:  Showed no edema. I did not check her knee  CNS:  Grossly intact.   ASSESSMENT:  This is a young woman with knee replacement and a history of  asthma.  She is already using an incentive spirometer and I would like her  to continue that. I have gone ahead and ordered nebulizer treatments and I  will continue with her other medications and follow.  Thanks for allowing me  to see her with you.      Edward L. Juanetta Gosling, M.D.  Electronically Signed     ELH/MEDQ  D:  08/26/2006  T:  08/27/2006  Job:  [id number]

## 2011-04-12 NOTE — Group Therapy Note (Signed)
[name], [name]                ACCOUNT NO.:  [id number]   MEDICAL RECORD NO.:  [id number]          PATIENT TYPE:  INP   LOCATION:  A308                          FACILITY:  [facility]   PHYSICIAN:  Edward L. Juanetta Gosling, M.D.DATE OF BIRTH:  [birth year]   DATE OF PROCEDURE:  DATE OF DISCHARGE:                                   PROGRESS NOTE   PROBLEM LIST:  1. Status post knee replacement.  2. Asthma.  3. Reflux esophagitis with status post Nissen fundoplication.   SUBJECTIVE:  [name] seems to be doing okay.  She has no new complaints  except she feels like maybe she had a pill that got stuck in her throat.  She has some burning when she swallowed.   PHYSICAL EXAMINATION:  Her exam today shows that she looks alert and awake.  CHEST:  Is very clear.  Her temperature is 98, pulse 95, respirations 24, blood pressure 133/82, O2  sat is 98% on room air.  She is doing 2500 on her incentive spirometer.   ASSESSMENT:  She is doing well except for the possible pill esophagitis.   PLAN:  Is to go ahead and start her on some Carafate, continue with her  other treatment.  If she does not improve she may need an evaluation by the  GI team.      Oneal Deputy. Juanetta Gosling, M.D.  Electronically Signed     ELH/MEDQ  D:  08/27/2006  T:  08/28/2006  Job:  [id number]

## 2011-06-25 ENCOUNTER — Other Ambulatory Visit: Payer: Self-pay | Admitting: Obstetrics & Gynecology

## 2011-06-25 DIAGNOSIS — Z139 Encounter for screening, unspecified: Secondary | ICD-10-CM

## 2011-06-26 ENCOUNTER — Emergency Department (HOSPITAL_COMMUNITY)
Admission: EM | Admit: 2011-06-26 | Discharge: 2011-06-26 | Disposition: A | Discharge status: Home / Self Care | Payer: BC Managed Care – PPO | Attending: Emergency Medicine | Admitting: Emergency Medicine

## 2011-06-26 ENCOUNTER — Emergency Department (HOSPITAL_COMMUNITY): Payer: BC Managed Care – PPO

## 2011-06-26 ENCOUNTER — Encounter: Payer: Self-pay | Admitting: *Deleted

## 2011-06-26 DIAGNOSIS — G43909 Migraine, unspecified, not intractable, without status migrainosus: Secondary | ICD-10-CM | POA: Insufficient documentation

## 2011-06-26 DIAGNOSIS — J45909 Unspecified asthma, uncomplicated: Secondary | ICD-10-CM | POA: Insufficient documentation

## 2011-06-26 MED ORDER — OXYCODONE-ACETAMINOPHEN 5-325 MG PO TABS: 1.0000 | ORAL_TABLET | ORAL | Status: AC | PRN

## 2011-06-26 MED ORDER — DEXAMETHASONE SODIUM PHOSPHATE 10 MG/ML IJ SOLN
10.0000 mg | Freq: Once | INTRAMUSCULAR | Status: AC
  Administered 2011-06-26: 10 mg via INTRAMUSCULAR
  Filled 2011-06-26: qty 1

## 2011-06-26 MED ORDER — ONDANSETRON 8 MG PO TBDP
8.0000 mg | ORAL_TABLET | Freq: Once | ORAL | Status: AC
  Administered 2011-06-26: 8 mg via ORAL
  Filled 2011-06-26: qty 1

## 2011-06-26 MED ORDER — HYDROMORPHONE HCL 1 MG/ML IJ SOLN
1.0000 mg | Freq: Once | INTRAMUSCULAR | Status: AC
  Administered 2011-06-26: 1 mg via INTRAMUSCULAR
  Filled 2011-06-26: qty 1

## 2011-06-26 MED ORDER — DIPHENHYDRAMINE HCL 25 MG PO CAPS
50.0000 mg | ORAL_CAPSULE | Freq: Once | ORAL | Status: AC
  Administered 2011-06-26: 50 mg via ORAL
  Filled 2011-06-26: qty 2

## 2011-06-26 NOTE — ED Notes (Signed)
Migraine for four days, denies any n/v has hx of same

## 2011-06-26 NOTE — ED Notes (Signed)
Medicated as ordered for pain; awaiting CT; c/o migraine HA pain x 4 days that is unrelieved with usual meds (Maxalt and Stadol nasal spray); c/o nausea, denies vomiting

## 2011-06-26 NOTE — ED Provider Notes (Signed)
History     CSN: [id number] Arrival date & time: 06/26/2011  5:24 PM  Chief Complaint  Patient presents with  . Migraine   Patient is a 45 y.o. female presenting with migraine. The history is provided by the patient.  Migraine This is a recurrent problem. The current episode started in the past 7 days (started abruptly 4 days ago.). The problem occurs constantly. The problem has been unchanged. Associated symptoms include headaches, numbness and a visual change. Pertinent negatives include no chills, congestion, coughing, fatigue, fever, myalgias, nausea, sore throat, vertigo, vomiting or weakness. Diaphoresis: jacub. Associated symptoms comments: Photophobia.  She also describes intermittent scotoma during the past 4 days,  Fleeting,  Not typical of her usual migraine.. The symptoms are aggravated by nothing. Treatments tried: she has tried her relpax and her stadol nasal spray without significant relief. The treatment provided no relief.    Past Medical History  Diagnosis Date  . Migraine   . Asthma     Past Surgical History  Procedure Date  . Septoplasty   . Tubal ligation   . Abdominal hysterectomy   . Replacement total knee bilateral   . Nissen fundoplication     History reviewed. No pertinent family history.  History  Substance Use Topics  . Smoking status: Never Smoker   . Smokeless tobacco: Not on file  . Alcohol Use: Yes     occassional    OB History    Grav Para Term Preterm Abortions TAB SAB Ect Mult Living                  Review of Systems  Constitutional: Negative for fever, chills and fatigue. Diaphoresis: jacub.  HENT: Negative for congestion and sore throat.   Respiratory: Negative for cough.   Gastrointestinal: Negative.  Negative for nausea and vomiting.  Musculoskeletal: Negative for myalgias.  Neurological: Positive for numbness and headaches. Negative for vertigo and weakness.  Psychiatric/Behavioral: Negative.     Physical Exam  BP 121/87   Pulse 112  Temp(Src) 98.2 F (36.8 C) (Oral)  Resp 20  Ht 5' 5.5" (1.664 m)  Wt 154 lb (69.854 kg)  BMI 25.24 kg/m2  SpO2 99%  Physical Exam  Vitals reviewed. Constitutional: She is oriented to person, place, and time. She appears well-developed and well-nourished.  HENT:  Head: Normocephalic and atraumatic.  Right Ear: External ear normal.  Left Ear: External ear normal.  Eyes: Conjunctivae and EOM are normal. Pupils are equal, round, and reactive to light.  Neck: Normal range of motion. Neck supple.  Cardiovascular: Normal rate, regular rhythm, normal heart sounds and intact distal pulses.   Pulmonary/Chest: Effort normal and breath sounds normal. She has no wheezes.  Abdominal: Soft. Bowel sounds are normal. There is no tenderness.  Musculoskeletal: Normal range of motion.  Lymphadenopathy:    She has no cervical adenopathy.  Neurological: She is alert and oriented to person, place, and time. She has normal strength and normal reflexes. No cranial nerve deficit. She displays a negative Romberg sign. Coordination and gait normal. GCS eye subscore is 4. GCS verbal subscore is 5. GCS motor subscore is 6.  Skin: Skin is warm and dry.  Psychiatric: She has a normal mood and affect.    ED Course  Procedures  MDM  Migraine with aura.  No neuro deficits. Patient obtained modest relief with decadron 10 mg IM,  zofran 8 odt and benadryl 50 mg PO.  Dilaudid 1 mg IM - patients headache improved.  Candis Musa, PA 06/26/11 2111

## 2011-06-26 NOTE — ED Notes (Signed)
Left in c/o husband for transport home; a&ox4; in no distress

## 2011-07-09 ENCOUNTER — Ambulatory Visit (HOSPITAL_COMMUNITY)
Admission: RE | Admit: 2011-07-09 | Discharge: 2011-07-09 | Disposition: A | Discharge status: Home / Self Care | Payer: BC Managed Care – PPO | Source: Ambulatory Visit | Attending: Obstetrics & Gynecology | Admitting: Obstetrics & Gynecology

## 2011-07-09 DIAGNOSIS — Z139 Encounter for screening, unspecified: Secondary | ICD-10-CM

## 2011-07-09 DIAGNOSIS — Z1231 Encounter for screening mammogram for malignant neoplasm of breast: Secondary | ICD-10-CM | POA: Insufficient documentation

## 2011-07-11 ENCOUNTER — Encounter: Payer: Self-pay | Admitting: Vascular Surgery

## 2011-07-12 ENCOUNTER — Other Ambulatory Visit: Payer: Self-pay | Admitting: Obstetrics & Gynecology

## 2011-07-12 DIAGNOSIS — R928 Other abnormal and inconclusive findings on diagnostic imaging of breast: Secondary | ICD-10-CM

## 2011-07-17 ENCOUNTER — Other Ambulatory Visit: Payer: Self-pay | Admitting: Obstetrics & Gynecology

## 2011-07-17 DIAGNOSIS — R928 Other abnormal and inconclusive findings on diagnostic imaging of breast: Secondary | ICD-10-CM

## 2011-07-23 ENCOUNTER — Encounter: Payer: Self-pay | Admitting: Vascular Surgery

## 2011-07-24 ENCOUNTER — Ambulatory Visit (INDEPENDENT_AMBULATORY_CARE_PROVIDER_SITE_OTHER): Payer: BC Managed Care – PPO

## 2011-07-24 ENCOUNTER — Ambulatory Visit
Admission: RE | Admit: 2011-07-24 | Discharge: 2011-07-24 | Disposition: A | Discharge status: Home / Self Care | Payer: BC Managed Care – PPO | Source: Ambulatory Visit | Attending: Obstetrics & Gynecology | Admitting: Obstetrics & Gynecology

## 2011-07-24 ENCOUNTER — Encounter: Payer: Self-pay | Admitting: Vascular Surgery

## 2011-07-24 ENCOUNTER — Ambulatory Visit (INDEPENDENT_AMBULATORY_CARE_PROVIDER_SITE_OTHER): Payer: BC Managed Care – PPO | Admitting: Vascular Surgery

## 2011-07-24 VITALS — BP 130/85 | HR 79 | Resp 18 | Ht 65.0 in | Wt 160.0 lb

## 2011-07-24 DIAGNOSIS — R928 Other abnormal and inconclusive findings on diagnostic imaging of breast: Secondary | ICD-10-CM

## 2011-07-24 DIAGNOSIS — I8310 Varicose veins of unspecified lower extremity with inflammation: Secondary | ICD-10-CM

## 2011-07-24 DIAGNOSIS — I83893 Varicose veins of bilateral lower extremities with other complications: Secondary | ICD-10-CM

## 2011-07-24 DIAGNOSIS — M79609 Pain in unspecified limb: Secondary | ICD-10-CM

## 2011-07-24 NOTE — Progress Notes (Signed)
Subjective:     Patient ID: [name], female   DOB: 1965/12/07, 45 y.o.   MRN: [id number]  HPI The patient is a 45 year old female presents today for evaluation of bilateral venous hypertension left leg greater than right. Assistant present for many years has become more symptomatic in her left thigh. She did have an episode of what sounds like superficial thrombophlebitis in her right thigh and has had minimal discomfort since the resolution of this. She reports an achy sensation specifically over the veins and also itching over the dorsum of her left foot and ankle. Is worse with worse with prolonged standing. She denies any history of deep venous thrombosis bleeding.  Review of Systems Positive for asthma and shortness of breath with exertion neurologic: Headache musculoskeletal: Arthritis psychiatric: Depression otherwise negative    Past Medical History  Diagnosis Date  . Migraine   . Asthma   . Allergy   . Varicose veins     History  Substance Use Topics  . Smoking status: Former Smoker -- 2 years    Types: Cigarettes    Quit date: 07/23/1981  . Smokeless tobacco: Never Used  . Alcohol Use: Yes     occassional    Family History  Problem Relation Age of Onset  . Diabetes Mother   . Migraines Sister     Allergies  Allergen Reactions  . Hydrocodone Itching    Itching,   . Nystatin   . Penicillins Rash  . Sulfamethoxazole W/Trimethoprim Rash    Pt has taken before but DS causes rash. Single strength does not  . Sulfonamide Derivatives Rash    Sulfa Allergy. DS causes a rash. SS does not    Current outpatient prescriptions:albuterol (VENTOLIN HFA) 108 (90 BASE) MCG/ACT inhaler, 2 puffs every 6 (six) hours as needed. For shortness of breath , Disp: , Rfl: ;  budesonide-formoterol (SYMBICORT) 160-4.5 MCG/ACT inhaler, Inhale 2 puffs into the lungs 2 (two) times daily.  , Disp: , Rfl: ;  buPROPion (WELLBUTRIN XL) 150 MG 24 hr tablet, Take 150 mg by mouth 2 (two) times  daily.  , Disp: , Rfl:  butorphanol (STADOL) 10 MG/ML nasal spray, Place 1 spray into the nose 2 (two) times daily as needed. For migraines , Disp: , Rfl: ;  dextromethorphan-guaiFENesin (MUCINEX DM) 30-600 MG per 12 hr tablet, Take 1 tablet by mouth every 12 (twelve) hours.  , Disp: , Rfl: ;  fluconazole (DIFLUCAN) 150 MG tablet, Take 150 mg by mouth once. When taking Antibiotics , Disp: , Rfl:  fluticasone (FLONASE) 50 MCG/ACT nasal spray, Place 2 sprays into the nose daily as needed. For allergies , Disp: , Rfl: ;  metroNIDAZOLE (FLAGYL) 500 MG tablet, Take 500 mg by mouth 2 (two) times daily. When taking antibiotic , Disp: , Rfl: ;  rizatriptan (MAXALT) 10 MG tablet, Take 10 mg by mouth as needed. May repeat in 2 hours if needed , Disp: , Rfl:  topiramate (TOPAMAX) 200 MG tablet, Take 200 mg by mouth 2 (two) times daily.  , Disp: , Rfl:   BP 130/85  Pulse 79  Resp 18  There is no height or weight on file to calculate BMI.       Objective:   Physical Exam Well-developed well-nourished white female no acute distress. HEENT normal. Pulmonary no labored breathing. 2+ radial 2+ dorsalis pedis pulses bilaterally. Musculoskeletal no major deformities or cyanosis. Old knee surgery scar on the left patella. Neurologic no focal weakness or paresthesias. Skin  without ulcers or rashes. 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 She will continue elevation and ibuprofen for discomfort. We will see her again in 3 months to determine if she is having successful treatment with conservative therapy. I did  discuss the possibility of laser ablation for great saphenous vein and stab phlebectomy for relief of her tributary he varicosities

## 2011-08-04 NOTE — ED Provider Notes (Signed)
Medical screening examination/treatment/procedure(s) were performed by non-physician practitioner and as supervising physician I was immediately available for consultation/collaboration.  Doug Sou, MD 08/04/11 2019

## 2011-08-08 NOTE — Procedures (Unsigned)
LOWER EXTREMITY VENOUS REFLUX EXAM  INDICATION:  Bilateral varicose veins.  EXAM:  Using color-flow imaging and pulse Doppler spectral analysis, the right and left) common femoral, superficial femoral, popliteal, posterior tibial, greater and lesser saphenous veins are evaluated. There is evidence suggesting deep venous insufficiency in the left lower extremity.  The right and left saphenofemoral junctions are not competent with reflux of >500 milliseconds. The right and left GSV are not competent with reflux of >500 milliseconds with the caliber as described below.  The right and left proximal small saphenous veins demonstrate competency.  GSV Diameter (used if found to be incompetent only)                                           Right    Left Proximal Greater Saphenous Vein           0.45 cm  0.82 cm Proximal-to-mid-thigh                     0.24 cm  0.49 cm Mid thigh                                 0.16 cm  0.42 cm Mid-distal thigh                          cm       cm Distal thigh                              0.15 cm  0.20 cm Knee                                      0.22 cm  0.40 cm  IMPRESSION: 1. Right and left great saphenous veins are not competent with reflux     of >500 milliseconds. 2. The right and left great saphenous vein is not tortuous. 3. The left deep venous system is not competent with reflux of >500     milliseconds. 4. The right and left small saphenous vein is competent.  ___________________________________________ Larina Earthly, M.D.  EM/MEDQ  D:  07/25/2011  T:  07/25/2011  Job:  [id number]

## 2011-10-28 ENCOUNTER — Encounter: Payer: Self-pay | Admitting: Vascular Surgery

## 2011-10-29 ENCOUNTER — Encounter: Payer: Self-pay | Admitting: Vascular Surgery

## 2011-10-29 ENCOUNTER — Ambulatory Visit (INDEPENDENT_AMBULATORY_CARE_PROVIDER_SITE_OTHER): Payer: BC Managed Care – PPO | Admitting: Vascular Surgery

## 2011-10-29 VITALS — BP 120/81 | HR 109 | Resp 20 | Ht 65.0 in | Wt 163.0 lb

## 2011-10-29 DIAGNOSIS — I83893 Varicose veins of bilateral lower extremities with other complications: Secondary | ICD-10-CM

## 2011-10-29 NOTE — Progress Notes (Signed)
Problems with Activities of Daily Living Secondary to Leg Pain  1. 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    2. [name] states cooking and shopping are very difficult for her due to leg pain.    Rankin, [name]   Failure of  Conservative Therapy:  1. Worn 20-30 mm Hg thigh high compression hose >3 months with no relief of symptoms.  2. Frequently elevates legs-no relief of symptoms  3. Taken Ibuprofen 600 Mg TID with no relief of symptoms.  The patient has today for continued discussion of her bilateral venous pathology. This is worse in her left leg. She has had no improvement with thigh graduated compression garments and continues to have pain despite this elevation and ibuprofen.  Past Medical History  Diagnosis Date  . Migraine   . Asthma   . Allergy   . Varicose veins   . Cataracts, bilateral     History  Substance Use Topics  . Smoking status: Former Smoker -- 2 years    Types: Cigarettes    Quit date: 07/23/1981  . Smokeless tobacco: Never Used  . Alcohol Use: Yes     occassional    Family History  Problem Relation Age of Onset  . Diabetes Mother   . Depression Mother   . Migraines Sister   . Other Sister     migranes  . Depression Sister   . Hypertension Father     Allergies  Allergen Reactions  . Hydrocodone Itching    Itching,   . Nystatin   . Penicillins Rash  . Sulfamethoxazole W/Trimethoprim Rash    Pt has taken before but DS causes rash. Single strength does not  . Sulfonamide Derivatives Rash    Sulfa Allergy. DS causes a rash. SS does not    Current outpatient prescriptions:albuterol (VENTOLIN HFA) 108 (90 BASE) MCG/ACT inhaler, 2 puffs every 6 (six) hours as needed. For shortness of breath , Disp: , Rfl: ;  budesonide-formoterol (SYMBICORT) 160-4.5 MCG/ACT inhaler, Inhale 2 puffs into  the lungs 2 (two) times daily.  , Disp: , Rfl: ;  buPROPion (WELLBUTRIN XL) 150 MG 24 hr tablet, Take 150 mg by mouth 2 (two) times daily.  , Disp: , Rfl:  butorphanol (STADOL) 10 MG/ML nasal spray, Place 1 spray into the nose 2 (two) times daily as needed. For migraines , Disp: , Rfl: ;  dextromethorphan-guaiFENesin (MUCINEX DM) 30-600 MG per 12 hr tablet, Take 1 tablet by mouth every 12 (twelve) hours.  , Disp: , Rfl: ;  fluconazole (DIFLUCAN) 150 MG tablet, Take 150 mg by mouth once. When taking Antibiotics , Disp: , Rfl:  fluticasone (FLONASE) 50 MCG/ACT nasal spray, Place 2 sprays into the nose daily as needed. For allergies , Disp: , Rfl: ;  metroNIDAZOLE (FLAGYL) 500 MG tablet, Take 500 mg by mouth 2 (two) times daily. When taking antibiotic , Disp: , Rfl: ;  rizatriptan (MAXALT) 10 MG tablet, Take 10 mg by mouth as needed. May repeat in 2 hours if needed , Disp: , Rfl:  topiramate (TOPAMAX) 200 MG tablet, Take 200 mg by mouth 2 (two) times daily.  , Disp: , Rfl:   BP 120/81  Pulse 109  Resp 20  Ht 5\' 5"  (1.651 m)  Wt 163 lb (73.936 kg)  BMI 27.12 kg/m2  Body mass index is  27.12 kg/(m^2).       Physical exam well-nourished white female no acute distress. 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  He wishes 30 failed conservative treatment. I recommend that we proceed with laser ablation of her left great saphenous vein, stab phlebectomy of tributary varicosities and sclerotherapy of telangiectasia around the level of her ankle for symptom relief. She understands this is an outpatient procedure and wished to proceed as soon as possible

## 2011-10-31 ENCOUNTER — Ambulatory Visit: Payer: BC Managed Care – PPO | Admitting: Vascular Surgery

## 2011-11-05 ENCOUNTER — Other Ambulatory Visit: Payer: Self-pay | Admitting: *Deleted

## 2011-11-05 DIAGNOSIS — I83893 Varicose veins of bilateral lower extremities with other complications: Secondary | ICD-10-CM

## 2011-12-11 ENCOUNTER — Encounter: Payer: Self-pay | Admitting: Vascular Surgery

## 2011-12-12 ENCOUNTER — Encounter: Payer: Self-pay | Admitting: Vascular Surgery

## 2011-12-12 ENCOUNTER — Ambulatory Visit (INDEPENDENT_AMBULATORY_CARE_PROVIDER_SITE_OTHER): Payer: BC Managed Care – PPO | Admitting: Vascular Surgery

## 2011-12-12 VITALS — BP 143/86 | HR 99 | Resp 18 | Ht 65.0 in | Wt 156.0 lb

## 2011-12-12 DIAGNOSIS — I83893 Varicose veins of bilateral lower extremities with other complications: Secondary | ICD-10-CM | POA: Insufficient documentation

## 2011-12-12 HISTORY — PX: ENDOVENOUS ABLATION SAPHENOUS VEIN W/ LASER: SUR449

## 2011-12-12 NOTE — Progress Notes (Signed)
Laser Ablation Procedure      Date: 12/12/2011    [name] DOB:11/03/1966  Consent signed: Yes  Surgeon:T.F. Bryannah Boston  Procedure: Laser Ablation: left Greater Saphenous Vein  BP 143/86  Pulse 99  Resp 18  Ht 5\' 5"  (1.651 m)  Wt 156 lb (70.761 kg)  BMI 25.96 kg/m2  Start time: 840AM   End time: 950AM  Tumescent Anesthesia: 425 cc 0.9% NaCl with 50 cc Lidocaine HCL with 1% Epi and 15 cc 8.4% NaHCO3  Local Anesthesia: 3 cc Lidocaine HCL and NaHCO3 (ratio 2:1)  Continuous Mode: 15 Watts Total Energy 2140 Joules Total Time2:22   Sclerotherapy: 0.3 %Sotradecol. Patient received a total of 3 cc  Stab Phlebectomy: <10 Sites: Thigh left  Patient tolerated procedure well: Yes  Rankin, [name]  Description of Procedure:  After marking the course of the saphenous vein and the secondary varicosities in the standing position, the patient was placed on the operating table in the supine position, and the left leg was prepped and draped in sterile fashion. Local anesthetic was administered, and under ultrasound guidance the saphenous vein was accessed with a micro needle and guide wire; then the micro puncture sheath was placed. A guide wire was inserted to the saphenofemoral junction, followed by a 5 french sheath.  The position of the sheath and then the laser fiber below the junction was confirmed using the ultrasound and visualization of the aiming beam.  Tumescent anesthesia was administered along the course of the saphenous vein using ultrasound guidance. Protective laser glasses were placed on the patient, and the laser was fired at at 15 watt continuous mode.  For a total of 2140 joules.  A steri strip was applied to the puncture site.  The patient was then put into Trendelenburg position.  Local anesthetic was utilized overlying the marked varicosities.  Greater than <10 stab wounds were made using the tip of an 11 blade; and using the vein hook,  The phlebectomies were performed  using a hemostat to avulse these varicosities.  Adequate hemostasis was achieved, and steri strips were applied to the stab wound.    Sclerotherapy was performed to 5 vessels  using 3  cc .3% Sotradecol foam via a 30 gauge needle.  ABD pads and thigh high compression stockings were applied.  Ace wrap bandages were applied over the phlebectomy sites and at the top of the saphenofemoral junction.  Blood loss was less than 15 cc.  The patient ambulated out of the operating room having tolerated the procedure well.  The patient tolerated procedure without difficulty. Had ablation from the level of her knee to just below the saphenofemoral junction. Had phlebectomy of several tributary varicosities in her medial thigh and sclerotherapy of painful telangiectasia in her medial and lateral ankle. Will be seen again in one week

## 2011-12-13 ENCOUNTER — Telehealth: Payer: Self-pay | Admitting: *Deleted

## 2011-12-13 ENCOUNTER — Encounter: Payer: Self-pay | Admitting: Vascular Surgery

## 2011-12-13 NOTE — Telephone Encounter (Signed)
12/13/2011  Time: 1:51 PM   Patient Name: [name]  Patient of: T.F. Early  Procedure:Laser Ablation and Sclerotherapy left leg  and stab phlebectomy <10 left leg 12-12-2011  Reached patient at home and checked  Her status  Yes    Comments/Actions Taken: Mrs. Stuckert complaining of mild/moderate pain left upper thigh/groin and over stab phlebectomy site left thigh.  Encouraged her to take Ibuprofen 600 mg TID with meals and to use ice compress to left inner thigh prn pain.  Encouraged her to make sure compression dressing is covering left thigh up to groin and to reinforce this area with Ace wrap if needed. Reminded her of postprocedural instructions and of post laser ablation duplex and follow up appointment with Dr. Arbie Cookey on 12-19-2011. Rankin, [name]    @SIGNATURE @

## 2011-12-18 ENCOUNTER — Encounter: Payer: Self-pay | Admitting: Vascular Surgery

## 2011-12-19 ENCOUNTER — Other Ambulatory Visit: Payer: Self-pay | Admitting: Obstetrics & Gynecology

## 2011-12-19 ENCOUNTER — Ambulatory Visit (INDEPENDENT_AMBULATORY_CARE_PROVIDER_SITE_OTHER): Payer: BC Managed Care – PPO | Admitting: Vascular Surgery

## 2011-12-19 ENCOUNTER — Encounter: Payer: Self-pay | Admitting: Vascular Surgery

## 2011-12-19 ENCOUNTER — Other Ambulatory Visit (INDEPENDENT_AMBULATORY_CARE_PROVIDER_SITE_OTHER): Payer: BC Managed Care – PPO | Admitting: *Deleted

## 2011-12-19 ENCOUNTER — Other Ambulatory Visit: Payer: Self-pay | Admitting: *Deleted

## 2011-12-19 VITALS — BP 127/79 | HR 106 | Resp 18 | Ht 65.0 in | Wt 155.0 lb

## 2011-12-19 DIAGNOSIS — I83893 Varicose veins of bilateral lower extremities with other complications: Secondary | ICD-10-CM

## 2011-12-19 DIAGNOSIS — N649 Disorder of breast, unspecified: Secondary | ICD-10-CM

## 2011-12-19 DIAGNOSIS — Z48812 Encounter for surgical aftercare following surgery on the circulatory system: Secondary | ICD-10-CM

## 2011-12-19 DIAGNOSIS — I8310 Varicose veins of unspecified lower extremity with inflammation: Secondary | ICD-10-CM

## 2011-12-19 NOTE — Progress Notes (Signed)
The patient is here today for one week followup after her ablation of her left great saphenous vein and sclerotherapy around the level of the ankle painful tributary telangiectasia. She has had a moderate amount of soreness in the medial thigh at the ablation site she does have some bruising around the phlebectomy site in the distal medial thigh. Her sclerotherapy sites all appear to be a healing appropriately at one week.  She did undergo a venous duplex which shows closure of her saphenous vein from the saphenous bite saphenous insertion site to the saphenofemoral junction. There is a small area thrombus extending partially into the lumen of the common femoral vein. I reviewed these images and discussed them with [name]. I explained we would images again in one month she will notify should she develop any difficulty in the interim. She will continue her compression for one additional week as tolerated and then discontinue compression

## 2011-12-26 NOTE — Procedures (Unsigned)
DUPLEX DEEP VENOUS EXAM - LOWER EXTREMITY  INDICATION:  Followup left great saphenous vein ablation.  HISTORY:  Edema:  Yes Trauma/Surgery:  Left great saphenous vein ablation Pain:  Yes PE:  No Previous DVT:  No Anticoagulants:  No Other:  DUPLEX EXAM:               CFV   SFV   PopV  PTV    GSV               R  L  R  L  R  L  R   L  R  L Thrombosis       +     o     o      o     + Spontaneous      o     +     +      +     o Phasic           o     +     +      +     o Augmentation     o     +     +      +     o Compressible     o     +     +      +     o Competent        o     +     +      +     +  Legend:  + - yes  o - no  p - partial  D - decreased  IMPRESSION: 1. The left great saphenous vein appears thrombosed from the distal     insertion site to the saphenofemoral junction. 2. There was thrombus extending into the left common femoral vein with     partial compressibility noted. 3. The remaining deep system is without DVT.   _____________________________ Larina Earthly, M.D.  EM/MEDQ  D:  12/19/2011  T:  12/19/2011  Job:  [id number]

## 2012-01-13 ENCOUNTER — Encounter: Payer: Self-pay | Admitting: Vascular Surgery

## 2012-01-14 ENCOUNTER — Ambulatory Visit (INDEPENDENT_AMBULATORY_CARE_PROVIDER_SITE_OTHER): Payer: BC Managed Care – PPO | Admitting: Vascular Surgery

## 2012-01-14 ENCOUNTER — Encounter: Payer: Self-pay | Admitting: Vascular Surgery

## 2012-01-14 ENCOUNTER — Encounter (INDEPENDENT_AMBULATORY_CARE_PROVIDER_SITE_OTHER): Payer: BC Managed Care – PPO | Admitting: *Deleted

## 2012-01-14 ENCOUNTER — Ambulatory Visit
Admission: RE | Admit: 2012-01-14 | Discharge: 2012-01-14 | Disposition: A | Discharge status: Home / Self Care | Payer: BC Managed Care – PPO | Source: Ambulatory Visit | Attending: Obstetrics & Gynecology | Admitting: Obstetrics & Gynecology

## 2012-01-14 VITALS — BP 125/79 | HR 113 | Resp 16 | Ht 65.0 in | Wt 165.2 lb

## 2012-01-14 DIAGNOSIS — I83893 Varicose veins of bilateral lower extremities with other complications: Secondary | ICD-10-CM

## 2012-01-14 DIAGNOSIS — N649 Disorder of breast, unspecified: Secondary | ICD-10-CM

## 2012-01-14 NOTE — Progress Notes (Signed)
The patient presents today for continued followup of her left great saphenous vein ablation and stab phlebectomy partially 6 weeks ago. On her one week duplex followup she had a small area of thrombus extending from the saphenofemoral junction into the common femoral vein. This was nonocclusive. He is here today for duplex followup.  She's had no complications and has had good resolution of the discomfort in her left leg  Venous duplex: A closure of her left great saphenous vein. No evidence of thrombus in the left common femoral vein  Impression and plan: Good [name] result from ablation of her left great saphenous vein and stab phlebectomy she will continue her usual activities and see [place] again on an as-needed basis with no evidence of DVT

## 2012-01-16 NOTE — Procedures (Unsigned)
DUPLEX DEEP VENOUS EXAM - LOWER EXTREMITY  INDICATION:  Followup left common femoral vein thrombus following GSV ablation.  HISTORY:  Edema:  No Trauma/Surgery:  Left GSV ablation Pain:  No PE:  No Previous DVT:  Yes, following left GSV ablation.  Thrombus extending into the common femoral vein at the saphenofemoral junction. Anticoagulants: Other:  DUPLEX EXAM:               CFV   SFV   PopV  PTV    GSV               R  L  R  L  R  L  R   L  R  L Thrombosis    o  o     o     o      o     + Spontaneous   +  +     +     +      +     o Phasic        +  +     +     +      +     o Augmentation  +  +     +     +      +     o Compressible  +  +     +     +      +     o Competent     +  o     +     +      +     +  Legend:  + - yes  o - no  p - partial  D - decreased  IMPRESSION: 1. No evidence of thrombus noted in the left common femoral vein. 2. No evidence of left lower extremity deep venous thrombosis. 3. The left great saphenous vein is thrombosed from the saphenofemoral     junction to the distal insertion site.   _____________________________ Larina Earthly, M.D.  EM/MEDQ  D:  01/14/2012  T:  01/14/2012  Job:  [id number]

## 2012-01-24 ENCOUNTER — Encounter (INDEPENDENT_AMBULATORY_CARE_PROVIDER_SITE_OTHER): Payer: Self-pay | Admitting: *Deleted

## 2012-02-13 ENCOUNTER — Telehealth (INDEPENDENT_AMBULATORY_CARE_PROVIDER_SITE_OTHER): Payer: Self-pay | Admitting: *Deleted

## 2012-02-13 ENCOUNTER — Ambulatory Visit (INDEPENDENT_AMBULATORY_CARE_PROVIDER_SITE_OTHER): Payer: BC Managed Care – PPO | Admitting: Internal Medicine

## 2012-02-13 ENCOUNTER — Encounter (INDEPENDENT_AMBULATORY_CARE_PROVIDER_SITE_OTHER): Payer: Self-pay | Admitting: Internal Medicine

## 2012-02-13 ENCOUNTER — Other Ambulatory Visit (INDEPENDENT_AMBULATORY_CARE_PROVIDER_SITE_OTHER): Payer: Self-pay | Admitting: *Deleted

## 2012-02-13 VITALS — BP 102/70 | HR 72 | Temp 98.0°F | Ht 64.5 in | Wt 166.0 lb

## 2012-02-13 DIAGNOSIS — K625 Hemorrhage of anus and rectum: Secondary | ICD-10-CM

## 2012-02-13 DIAGNOSIS — Z1211 Encounter for screening for malignant neoplasm of colon: Secondary | ICD-10-CM

## 2012-02-13 MED ORDER — PEG-KCL-NACL-NASULF-NA ASC-C 100 G PO SOLR: 1.0000 | Freq: Once | ORAL | Status: DC

## 2012-02-13 NOTE — Patient Instructions (Signed)
Colonoscopy with Dr. Rehman 

## 2012-02-13 NOTE — Progress Notes (Signed)
Subjective:     Patient ID: [name], female   DOB: 07/02/1966, 46 y.o.   MRN: [id number]  HPI Referred by Dr. Juanetta Gosling for rectal bleeding x 2 weeks. This occurred in December and last ed approximately 2 weeks. The blood was bright red.  She felt like she had to have a BM and when she went she would pass blood.  She also had mucous with the blood. She has a BM about one a day.   Sometimes she will eat something that upset her stomach and she will have diarrhea. Appetite is good. No weight loss. She denies any abdominal. Stools are brown in color. No melena or bright red rectal bleeding.  No hx of family colon cancer.   Review of Systems see hpi    Current Outpatient Prescriptions  Medication Sig Dispense Refill  . albuterol (VENTOLIN HFA) 108 (90 BASE) MCG/ACT inhaler 2 puffs every 6 (six) hours as needed. For shortness of breath       . budesonide-formoterol (SYMBICORT) 160-4.5 MCG/ACT inhaler Inhale 2 puffs into the lungs 2 (two) times daily.        Marland Kitchen buPROPion (WELLBUTRIN XL) 150 MG 24 hr tablet Take 150 mg by mouth 2 (two) times daily.        . butorphanol (STADOL) 10 MG/ML nasal spray Place 1 spray into the nose 2 (two) times daily as needed. For migraines       . Casanthranol-Docusate Sodium 30-100 MG CAPS Take by mouth.      . dextromethorphan-guaiFENesin (MUCINEX DM) 30-600 MG per 12 hr tablet Take 1 tablet by mouth as needed.       . fluconazole (DIFLUCAN) 150 MG tablet Take 150 mg by mouth once. When taking Antibiotics       . metroNIDAZOLE (FLAGYL) 500 MG tablet Take 500 mg by mouth as needed. When taking antibiotic      . oxyCODONE-acetaminophen (PERCOCET) 5-325 MG per tablet Take 1 tablet by mouth every 4 (four) hours as needed.      . rizatriptan (MAXALT) 10 MG tablet Take 10 mg by mouth as needed. May repeat in 2 hours if needed       . topiramate (TOPAMAX) 200 MG tablet Take 200 mg by mouth 2 (two) times daily.        . fluticasone (FLONASE) 50 MCG/ACT nasal spray Place 2  sprays into the nose daily as needed. For allergies       . predniSONE (DELTASONE) 10 MG tablet Take 10 mg by mouth daily. Take 1 tablet daily for three days.       Past Medical History  Diagnosis Date  . Migraine   . Asthma   . Allergy   . Varicose veins   . Cataracts, bilateral    History   Social History  . Marital Status: Married    Spouse Name: N/A    Number of Children: N/A  . Years of Education: N/A   Occupational History  . Not on file.   Social History Main Topics  . Smoking status: Former Smoker -- 2 years    Types: Cigarettes    Quit date: 07/23/1981  . Smokeless tobacco: Never Used  . Alcohol Use: Yes     occassional  . Drug Use: No  . Sexually Active: Not on file   Other Topics Concern  . Not on file   Social History Narrative  . No narrative on file   Family Status  Relation Status Death Age  .  Mother Alive   . Sister Alive   . Father Alive   . Brother Alive    Allergies  Allergen Reactions  . Hydrocodone Itching    Itching,   . Nystatin   . Penicillins Rash  . Sulfamethoxazole W/Trimethoprim Rash    Pt has taken before but DS causes rash. Single strength does not  . Sulfonamide Derivatives Rash    Sulfa Allergy. DS causes a rash. SS does not    Objective:   Physical Exam Filed Vitals:   02/13/12 1019  Height: 5' 4.5" (1.638 m)  Weight: 166 lb (75.297 kg)   Alert and oriented. Skin warm and dry. Oral mucosa is moist.   . Sclera anicteric, conjunctivae is pink. Thyroid not enlarged. No cervical lymphadenopathy. Lungs clear. Heart regular rate and rhythm.  Abdomen is soft. Bowel sounds are positive. No hepatomegaly. No abdominal masses felt. No tenderness.  No edema to lower extremities. Patient is alert and oriented.       Assessment:     Rectal bleeding resolved. Colonic neoplasm, AVM , polyp needs to be ruled out.    Plan:    Colonoscopy with Dr. Karilyn Cota. The risks and benefits such as perforation, bleeding, and infection were  reviewed with the patient and is agreeable.

## 2012-02-13 NOTE — Telephone Encounter (Signed)
Patient needs movi prep 

## 2012-03-02 ENCOUNTER — Telehealth (INDEPENDENT_AMBULATORY_CARE_PROVIDER_SITE_OTHER): Payer: Self-pay | Admitting: *Deleted

## 2012-03-02 NOTE — Telephone Encounter (Signed)
[name] had called and wanted to ask you about whether she needed antibx prior to her procedure on 4/18.  She has had bilateral knee replacement and she also lost her instructions.

## 2012-03-02 NOTE — Telephone Encounter (Signed)
Spoke to patient, per Dr Karilyn Cota patient will take the antibiotic she has at home that she normally takes prior to dental procedures and instruction sheet mailed

## 2012-03-04 ENCOUNTER — Ambulatory Visit: Payer: BC Managed Care – PPO | Admitting: Orthopedic Surgery

## 2012-03-11 MED ORDER — SODIUM CHLORIDE 0.45 % IV SOLN
Freq: Once | INTRAVENOUS | Status: AC
  Administered 2012-03-12: 1000 mL via INTRAVENOUS

## 2012-03-12 ENCOUNTER — Encounter (HOSPITAL_COMMUNITY)
Admission: RE | Disposition: A | Discharge status: Home Health | Payer: Self-pay | Source: Ambulatory Visit | Attending: Internal Medicine

## 2012-03-12 ENCOUNTER — Encounter (HOSPITAL_COMMUNITY): Payer: Self-pay | Admitting: *Deleted

## 2012-03-12 ENCOUNTER — Ambulatory Visit (HOSPITAL_COMMUNITY)
Admission: RE | Admit: 2012-03-12 | Discharge: 2012-03-12 | Disposition: A | Discharge status: Home Health | Payer: BC Managed Care – PPO | Source: Ambulatory Visit | Attending: Internal Medicine | Admitting: Internal Medicine

## 2012-03-12 DIAGNOSIS — K625 Hemorrhage of anus and rectum: Secondary | ICD-10-CM

## 2012-03-12 DIAGNOSIS — D126 Benign neoplasm of colon, unspecified: Secondary | ICD-10-CM

## 2012-03-12 DIAGNOSIS — K921 Melena: Secondary | ICD-10-CM | POA: Insufficient documentation

## 2012-03-12 DIAGNOSIS — K644 Residual hemorrhoidal skin tags: Secondary | ICD-10-CM

## 2012-03-12 HISTORY — PX: COLONOSCOPY: SHX5424

## 2012-03-12 SURGERY — COLONOSCOPY
Anesthesia: Moderate Sedation

## 2012-03-12 MED ORDER — MIDAZOLAM HCL 5 MG/5ML IJ SOLN
INTRAMUSCULAR | Status: AC
  Filled 2012-03-12: qty 10

## 2012-03-12 MED ORDER — MEPERIDINE HCL 50 MG/ML IJ SOLN
INTRAMUSCULAR | Status: DC | PRN
  Administered 2012-03-12 (×2): 25 mg via INTRAVENOUS

## 2012-03-12 MED ORDER — MEPERIDINE HCL 50 MG/ML IJ SOLN
INTRAMUSCULAR | Status: AC
  Filled 2012-03-12: qty 1

## 2012-03-12 MED ORDER — STERILE WATER FOR IRRIGATION IR SOLN
Status: DC | PRN
  Administered 2012-03-12: 12:00:00

## 2012-03-12 MED ORDER — MIDAZOLAM HCL 5 MG/5ML IJ SOLN
INTRAMUSCULAR | Status: DC | PRN
  Administered 2012-03-12: 2 mg via INTRAVENOUS
  Administered 2012-03-12: 1 mg via INTRAVENOUS
  Administered 2012-03-12: 2 mg via INTRAVENOUS
  Administered 2012-03-12: 3 mg via INTRAVENOUS

## 2012-03-12 NOTE — Brief Op Note (Signed)
Pt took clindymycin 400 mg 2 doses at 09:30 today

## 2012-03-12 NOTE — Op Note (Addendum)
COLONOSCOPY PROCEDURE REPORT  PATIENT:  [name]  MR#:  [id number] Birthdate:  1966/03/08, 46 y.o., female Endoscopist:  Dr. Malissa Hippo, MD Referred By:  Dr. Oneal Deputy. Juanetta Gosling, MD Procedure Date: 03/12/2012  Procedure:   Colonoscopy  Indications:  Patient is 46 year old  Caucasian female who presents with two-week episode of intermittent hematochezia associated with change in her bowel habits.  Informed Consent:  The procedure and risks were reviewed with the patient and informed consent was obtained.  Medications:  Demerol 50 mg IV Versed 8 mg IV  Description of procedure:  After a digital rectal exam was performed, that colonoscope was advanced from the anus through the rectum and colon to the area of the cecum, ileocecal valve and appendiceal orifice. The cecum was deeply intubated. These structures were well-seen and photographed for the record. From the level of the cecum and ileocecal valve, the scope was slowly and cautiously withdrawn. The mucosal surfaces were carefully surveyed utilizing scope tip to flexion to facilitate fold flattening as needed. The scope was pulled down into the rectum where a thorough exam including retroflexion was performed.  Findings:   Prep excellent. 4 mm polyp ablated via cold biopsy from the sigmoid colon. Normal rectal mucosa. Small hemorrhoids below the dentate line.  Therapeutic/Diagnostic Maneuvers Performed:  See above  Complications:  None  Cecal Withdrawal Time:  13 minutes  Impression:  Examination performed to the cecum. Single small polyp ablated via cold biopsy from sigmoid colon. Small external hemorrhoids but no evidence of diverticulosis or colitis.  Recommendations:  Standard instructions given. I would be contacting the patient with results of biopsy and further recommendations.  REHMAN,NAJEEB U  03/12/2012 1:04 PM  CC: Dr. Fredirick Maudlin, MD, MD & Dr. Bonnetta Barry ref. provider found

## 2012-03-12 NOTE — Discharge Instructions (Signed)
Resume usual medications and diet. No driving for 24 hours. Physician will contact you with biopsy results.  Colonoscopy Care After Read the instructions outlined below and refer to this sheet in the next few weeks. These discharge instructions provide you with general information on caring for yourself after you leave the hospital. Your doctor may also give you specific instructions. While your treatment has been planned according to the most current medical practices available, unavoidable complications occasionally occur. If you have any problems or questions after discharge, call your doctor. HOME CARE INSTRUCTIONS ACTIVITY:  You may resume your regular activity, but move at a slower pace for the next 24 hours.   Take frequent rest periods for the next 24 hours.   Walking will help get rid of the air and reduce the bloated feeling in your belly (abdomen).   No driving for 24 hours (because of the medicine (anesthesia) used during the test).   You may shower.   Do not sign any important legal documents or operate any machinery for 24 hours (because of the anesthesia used during the test).  NUTRITION:  Drink plenty of fluids.   You may resume your normal diet as instructed by your doctor.   Begin with a light meal and progress to your normal diet. Heavy or fried foods are harder to digest and may make you feel sick to your stomach (nauseated).   Avoid alcoholic beverages for 24 hours or as instructed.  MEDICATIONS:  You may resume your normal medications unless your doctor tells you otherwise.  WHAT TO EXPECT TODAY:  Some feelings of bloating in the abdomen.   Passage of more gas than usual.   Spotting of blood in your stool or on the toilet paper.  IF YOU HAD POLYPS REMOVED DURING THE COLONOSCOPY:  No aspirin products for 7 days or as instructed.   No alcohol for 7 days or as instructed.   Eat a soft diet for the next 24 hours.  FINDING OUT THE RESULTS OF YOUR  TEST Not all test results are available during your visit. If your test results are not back during the visit, make an appointment with your caregiver to find out the results. Do not assume everything is normal if you have not heard from your caregiver or the medical facility. It is important for you to follow up on all of your test results.  SEEK IMMEDIATE MEDICAL CARE IF:  You have more than a spotting of blood in your stool.   Your belly is swollen (abdominal distention).   You are nauseated or vomiting.   You have a fever.   You have abdominal pain or discomfort that is severe or gets worse throughout the day.  Document Released: 06/25/2004 Document Revised: 10/31/2011 Document Reviewed: 06/23/2008 [facility] Patient Information 2012 [place], [place].        Colon Polyps A polyp is extra tissue that grows inside your body. Colon polyps grow in the large intestine. The large intestine, also called the colon, is part of your digestive system. It is a long, hollow tube at the end of your digestive tract where your body makes and stores stool. Most polyps are not dangerous. They are benign. This means they are not cancerous. But over time, some types of polyps can turn into cancer. Polyps that are smaller than a pea are usually not harmful. But larger polyps could someday become or may already be cancerous. To be safe, doctors remove all polyps and test them.  WHO  GETS POLYPS? Anyone can get polyps, but certain people are more likely than others. You may have a greater chance of getting polyps if:  You are over 50.   You have had polyps before.   Someone in your family has had polyps.   Someone in your family has had cancer of the large intestine.   Find out if someone in your family has had polyps. You may also be more likely to get polyps if you:   Eat a lot of fatty foods.   Smoke.   Drink alcohol.   Do not exercise.   Eat too much.  SYMPTOMS  Most small polyps do not  cause symptoms. People often do not know they have one until their caregiver finds it during a regular checkup or while testing them for something else. Some people do have symptoms like these:  Bleeding from the anus. You might notice blood on your underwear or on toilet paper after you have had a bowel movement.   Constipation or diarrhea that lasts more than a week.   Blood in the stool. Blood can make stool look black or it can show up as red streaks in the stool.  If you have any of these symptoms, see your caregiver. HOW DOES THE DOCTOR TEST FOR POLYPS? The doctor can use four tests to check for polyps:  Digital rectal exam. The caregiver wears gloves and checks your rectum (the last part of the large intestine) to see if it feels normal. This test would find polyps only in the rectum. Your caregiver may need to do one of the other tests listed below to find polyps higher up in the intestine.   Barium enema. The caregiver puts a liquid called barium into your rectum before taking x-rays of your large intestine. Barium makes your intestine look white in the pictures. Polyps are dark, so they are easy to see.   Sigmoidoscopy. With this test, the caregiver can see inside your large intestine. A thin flexible tube is placed into your rectum. The device is called a sigmoidoscope, which has a light and a tiny video camera in it. The caregiver uses the sigmoidoscope to look at the last third of your large intestine.   Colonoscopy. This test is like sigmoidoscopy, but the caregiver looks at all of the large intestine. It usually requires sedation. This is the most common method for finding and removing polyps.  TREATMENT   The caregiver will remove the polyp during sigmoidoscopy or colonoscopy. The polyp is then tested for cancer.   If you have had polyps, your caregiver may want you to get tested regularly in the future.  PREVENTION  There is not one sure way to prevent polyps. You might be  able to lower your risk of getting them if you:  Eat more fruits and vegetables and less fatty food.   Do not smoke.   Avoid alcohol.   Exercise every day.   Lose weight if you are overweight.   Eating more calcium and folate can also lower your risk of getting polyps. Some foods that are rich in calcium are milk, cheese, and broccoli. Some foods that are rich in folate are chickpeas, kidney beans, and spinach.   Aspirin might help prevent polyps. Studies are under way.  Document Released: 08/07/2004 Document Revised: 10/31/2011 Document Reviewed: 01/13/2008 [facility] Patient Information 2012 [place], [place].  Hemorrhoids Hemorrhoids are enlarged (dilated) veins around the rectum. There are 2 types of hemorrhoids, and the type of  hemorrhoid is determined by its location. Internal hemorrhoids occur in the veins just inside the rectum.They are usually not painful, but they may bleed.However, they may poke through to the outside and become irritated and painful. External hemorrhoids involve the veins outside the anus and can be felt as a painful swelling or hard lump near the anus.They are often itchy and may crack and bleed. Sometimes clots will form in the veins. This makes them swollen and painful. These are called thrombosed hemorrhoids. CAUSES Causes of hemorrhoids include:  Pregnancy. This increases the pressure in the hemorrhoidal veins.   Constipation.   Straining to have a bowel movement.   Obesity.   Heavy lifting or other activity that caused you to strain.  TREATMENT Most of the time hemorrhoids improve in 1 to 2 weeks. However, if symptoms do not seem to be getting better or if you have a lot of rectal bleeding, your caregiver may perform a procedure to help make the hemorrhoids get smaller or remove them completely.Possible treatments include:  Rubber band ligation. A rubber band is placed at the base of the hemorrhoid to cut off the circulation.   Sclerotherapy. A  chemical is injected to shrink the hemorrhoid.   Infrared light therapy. Tools are used to burn the hemorrhoid.   Hemorrhoidectomy. This is surgical removal of the hemorrhoid.  HOME CARE INSTRUCTIONS   Increase fiber in your diet. Ask your caregiver about using fiber supplements.   Drink enough water and fluids to keep your urine clear or pale yellow.   Exercise regularly.   Go to the bathroom when you have the urge to have a bowel movement. Do not wait.   Avoid straining to have bowel movements.   Keep the anal area dry and clean.   Only take over-the-counter or prescription medicines for pain, discomfort, or fever as directed by your caregiver.  If your hemorrhoids are thrombosed:  Take warm sitz baths for 20 to 30 minutes, 3 to 4 times per day.   If the hemorrhoids are very tender and swollen, place ice packs on the area as tolerated. Using ice packs between sitz baths may be helpful. Fill a plastic bag with ice. Place a towel between the bag of ice and your skin.   Medicated creams and suppositories may be used or applied as directed.   Do not use a donut-shaped pillow or sit on the toilet for long periods. This increases blood pooling and pain.  SEEK MEDICAL CARE IF:   You have increasing pain and swelling that is not controlled with your medicine.   You have uncontrolled bleeding.   You have difficulty or you are unable to have a bowel movement.   You have pain or inflammation outside the area of the hemorrhoids.   You have chills or an oral temperature above 102 F (38.9 C).  MAKE SURE YOU:   Understand these instructions.   Will watch your condition.   Will get help right away if you are not doing well or get worse.  Document Released: 11/08/2000 Document Revised: 10/31/2011 Document Reviewed: 03/15/2008 [facility] Patient Information 2012 [place], [place].

## 2012-03-12 NOTE — H&P (Signed)
[name] is an 46 y.o. female.   Chief Complaint: Patient is here for colonoscopy. HPI: Patient is 46 year old Caucasian female presents with 2 week history of hematochezia. During these 2 weeks she noticed  change in her bowel habits and she passed bright red blood per bowel movements. Small to moderate amount. No prior history of antibiotics or NSAIDs use. She also denies abdominal pain. History is negative for colorectal carcinoma  Past Medical History  Diagnosis Date  . Migraine   . Asthma   . Allergy   . Varicose veins   . Cataracts, bilateral     Past Surgical History  Procedure Date  . Septoplasty   . Tubal ligation   . Abdominal hysterectomy   . Replacement total knee bilateral   . Nissen fundoplication   . Endovenous ablation saphenous vein w/ laser 12-12-2011    left greater saphenous vein       Family History  Problem Relation Age of Onset  . Diabetes Mother   . Depression Mother   . Migraines Sister   . Other Sister     migranes  . Depression Sister   . Hypertension Father    Social History:  reports that she quit smoking about 30 years ago. Her smoking use included Cigarettes. She quit after 2 years of use. She has never used smokeless tobacco. She reports that she drinks alcohol. She reports that she does not use illicit drugs.  Allergies:  Allergies  Allergen Reactions  . Hydrocodone Itching    Itching,   . Nystatin   . Penicillins Rash  . Sulfamethoxazole W/Trimethoprim Rash    Pt has taken before but DS causes rash. Single strength does not  . Sulfonamide Derivatives Rash    Sulfa Allergy. DS causes a rash. SS does not    Medications Prior to Admission  Medication Dose Route Frequency Provider Last Rate Last Dose  . 0.45 % sodium chloride infusion   Intravenous Once Malissa Hippo, MD 75 mL/hr at 03/12/12 1126 1,000 mL at 03/12/12 1126  . meperidine (DEMEROL) 50 MG/ML injection           . midazolam (VERSED) 5 MG/5ML injection             Medications Prior to Admission  Medication Sig Dispense Refill  . albuterol (VENTOLIN HFA) 108 (90 BASE) MCG/ACT inhaler 2 puffs every 6 (six) hours as needed. For shortness of breath       . budesonide-formoterol (SYMBICORT) 160-4.5 MCG/ACT inhaler Inhale 2 puffs into the lungs 2 (two) times daily.        Marland Kitchen buPROPion (WELLBUTRIN XL) 150 MG 24 hr tablet Take 150 mg by mouth 2 (two) times daily.        . butorphanol (STADOL) 10 MG/ML nasal spray Place 1 spray into the nose 2 (two) times daily as needed. For migraines       . dextromethorphan-guaiFENesin (MUCINEX DM) 30-600 MG per 12 hr tablet Take 1 tablet by mouth as needed.       . fluconazole (DIFLUCAN) 150 MG tablet Take 150 mg by mouth once. When taking Antibiotics       . metroNIDAZOLE (FLAGYL) 500 MG tablet Take 500 mg by mouth as needed. When taking antibiotic      . topiramate (TOPAMAX) 200 MG tablet Take 200 mg by mouth 2 (two) times daily.        . fluticasone (FLONASE) 50 MCG/ACT nasal spray Place 2 sprays into the nose daily  as needed. For allergies       . predniSONE (DELTASONE) 10 MG tablet Take 10 mg by mouth daily. Take 1 tablet daily for three days.      . rizatriptan (MAXALT) 10 MG tablet Take 10 mg by mouth as needed. May repeat in 2 hours if needed         No results found for this or any previous visit (from the past 48 hour(s)). No results found.  ROS  Blood pressure 121/74, pulse 92, temperature 98.1 F (36.7 C), temperature source Oral, resp. rate 23, height 5' 5.5" (1.664 m), weight 158 lb (71.668 kg), SpO2 90.00%. Physical Exam   Assessment/Plan Rectal bleeding. Diagnostic colonoscopy.  Marcile Fuquay U 03/12/2012, 12:17 PM

## 2012-03-17 ENCOUNTER — Encounter (INDEPENDENT_AMBULATORY_CARE_PROVIDER_SITE_OTHER): Payer: Self-pay | Admitting: *Deleted

## 2012-03-17 ENCOUNTER — Encounter (HOSPITAL_COMMUNITY): Payer: Self-pay | Admitting: Internal Medicine

## 2012-03-18 ENCOUNTER — Encounter: Payer: Self-pay | Admitting: Orthopedic Surgery

## 2012-03-18 ENCOUNTER — Ambulatory Visit (INDEPENDENT_AMBULATORY_CARE_PROVIDER_SITE_OTHER): Payer: BC Managed Care – PPO

## 2012-03-18 ENCOUNTER — Ambulatory Visit (INDEPENDENT_AMBULATORY_CARE_PROVIDER_SITE_OTHER): Payer: BC Managed Care – PPO | Admitting: Orthopedic Surgery

## 2012-03-18 VITALS — BP 100/64 | Ht 65.5 in | Wt 162.0 lb

## 2012-03-18 DIAGNOSIS — Z96659 Presence of unspecified artificial knee joint: Secondary | ICD-10-CM

## 2012-03-18 NOTE — Patient Instructions (Signed)
Activity as tolerated

## 2012-03-19 ENCOUNTER — Encounter: Payer: Self-pay | Admitting: Orthopedic Surgery

## 2012-03-19 NOTE — Progress Notes (Signed)
Patient ID: [name], female   DOB: [birth year], 46 y.o.   MRN: [id number] Chief Complaint  Patient presents with  . Follow-up    Recheck on bilateral knee replacements    BP 100/64  Ht 5' 5.5" (1.664 m)  Wt 162 lb (73.483 kg)  BMI 26.55 kg/m2  6 year followup status post knee replacements bilaterally.  The patient is doing very well at this time.  No pain swelling tenderness or catching locking or giving way  Review of systems negative  Exam bilateral knee replacements inspection reveals no abnormalities range of motion 125. Both knees are stable strength is normal skin is intact  Bilateral knee x-rays show stable implants with no loosening  Impression: A followup for arthritis of the knees bilateral x-rays normal  Followup in 2 years

## 2012-04-01 ENCOUNTER — Other Ambulatory Visit (HOSPITAL_COMMUNITY): Payer: Self-pay | Admitting: Pulmonary Disease

## 2012-04-01 ENCOUNTER — Ambulatory Visit (HOSPITAL_COMMUNITY)
Admission: RE | Admit: 2012-04-01 | Discharge: 2012-04-01 | Disposition: A | Discharge status: Home / Self Care | Payer: BC Managed Care – PPO | Source: Ambulatory Visit | Attending: Pulmonary Disease | Admitting: Pulmonary Disease

## 2012-04-01 DIAGNOSIS — R0602 Shortness of breath: Secondary | ICD-10-CM

## 2012-04-02 ENCOUNTER — Other Ambulatory Visit: Payer: Self-pay

## 2012-04-02 DIAGNOSIS — R0602 Shortness of breath: Secondary | ICD-10-CM

## 2012-04-07 ENCOUNTER — Ambulatory Visit (HOSPITAL_COMMUNITY)
Admission: RE | Admit: 2012-04-07 | Discharge: 2012-04-07 | Disposition: A | Discharge status: Home / Self Care | Payer: BC Managed Care – PPO | Source: Ambulatory Visit | Attending: Pulmonary Disease | Admitting: Pulmonary Disease

## 2012-04-07 DIAGNOSIS — R0989 Other specified symptoms and signs involving the circulatory and respiratory systems: Secondary | ICD-10-CM | POA: Insufficient documentation

## 2012-04-07 DIAGNOSIS — R0609 Other forms of dyspnea: Secondary | ICD-10-CM | POA: Insufficient documentation

## 2012-04-07 MED ORDER — ALBUTEROL SULFATE (5 MG/ML) 0.5% IN NEBU
2.5000 mg | INHALATION_SOLUTION | Freq: Once | RESPIRATORY_TRACT | Status: AC
  Administered 2012-04-07: 2.5 mg via RESPIRATORY_TRACT

## 2012-04-07 NOTE — Procedures (Signed)
NAMECORIANNA, AVALLONE                ACCOUNT NO.:  [id number]  MEDICAL RECORD NO.:  [id number]  LOCATION:  RESP                          FACILITY:  [facility]  PHYSICIAN:  Paulino Cork L. Juanetta Gosling, M.D.DATE OF BIRTH:  01-Dec-1965  DATE OF PROCEDURE: DATE OF DISCHARGE:                           PULMONARY FUNCTION TEST   Reason for pulmonary function testing is asthma.  1. Spirometry shows airflow obstruction with no definite ventilatory     defect. 2. Lung volumes are normal. 3. DLCO is borderline reduced, but does correct when ventilation is     taken into account. 4. Airway resistance is somewhat elevated and forming a presence of     airflow obstruction. 5. There is no significant bronchodilator improvement.     Argyle Gustafson L. Juanetta Gosling, M.D.     [facility]/MEDQ  D:  04/07/2012  T:  04/07/2012  Job:  [id number]

## 2012-04-23 LAB — PULMONARY FUNCTION TEST

## 2012-07-07 ENCOUNTER — Encounter (HOSPITAL_COMMUNITY)
Admission: RE | Admit: 2012-07-07 | Discharge: 2012-07-07 | Disposition: A | Discharge status: Home / Self Care | Payer: BC Managed Care – PPO | Source: Ambulatory Visit | Attending: Ophthalmology | Admitting: Ophthalmology

## 2012-07-07 ENCOUNTER — Encounter (HOSPITAL_COMMUNITY): Payer: Self-pay

## 2012-07-07 ENCOUNTER — Encounter (HOSPITAL_COMMUNITY): Payer: Self-pay | Admitting: Pharmacy Technician

## 2012-07-07 HISTORY — DX: Depression, unspecified: F32.A

## 2012-07-07 HISTORY — DX: Shortness of breath: R06.02

## 2012-07-07 HISTORY — DX: Major depressive disorder, single episode, unspecified: F32.9

## 2012-07-07 LAB — HEMOGLOBIN AND HEMATOCRIT, BLOOD
HCT: 41.7 % (ref 36.0–46.0)
Hemoglobin: 14.3 g/dL (ref 12.0–15.0)

## 2012-07-07 LAB — BASIC METABOLIC PANEL
BUN: 13 mg/dL (ref 6–23)
Chloride: 108 mEq/L (ref 96–112)
Creatinine, Ser: 1 mg/dL (ref 0.50–1.10)
GFR calc Af Amer: 76 mL/min — ABNORMAL LOW (ref >90)
Sodium: 139 mEq/L (ref 135–145)

## 2012-07-07 NOTE — Patient Instructions (Addendum)
Your procedure is scheduled on: 07/13/2012  Report to [facility] at   900     AM.  Call this number if you have problems the morning of surgery: [phone]   Do not eat food or drink liquids :After Midnight.      Take these medicines the morning of surgery with A SIP OF WATER:wellbutrin. Take albuterol,symbicort,flonase before you come and bring them with you.   Do not wear jewelry, make-up or nail polish.  Do not wear lotions, powders, or perfumes. You may wear deodorant.  Do not shave 48 hours prior to surgery.  Do not bring valuables to the hospital.  Contacts, dentures or bridgework may not be worn into surgery.  Leave suitcase in the car. After surgery it may be brought to your room.  For patients admitted to the hospital, checkout time is 11:00 AM the day of discharge.   Patients discharged the day of surgery will not be allowed to drive home.  :     Please read over the following fact sheets that you were given: Coughing and Deep Breathing, Surgical Site Infection Prevention, Anesthesia Post-op Instructions and Care and Recovery After Surgery    Cataract A cataract is a clouding of the lens of the eye. When a lens becomes cloudy, vision is reduced based on the degree and nature of the clouding. Many cataracts reduce vision to some degree. Some cataracts make people more near-sighted as they develop. Other cataracts increase glare. Cataracts that are ignored and become worse can sometimes look white. The white color can be seen through the pupil. CAUSES   Aging. However, cataracts may occur at any age, even in newborns.   Certain drugs.   Trauma to the eye.   Certain diseases such as diabetes.   Specific eye diseases such as chronic inflammation inside the eye or a sudden attack of a rare form of glaucoma.   Inherited or acquired medical problems.  SYMPTOMS   Gradual, progressive drop in vision in the affected eye.   Severe, rapid visual loss. This most often happens  when trauma is the cause.  DIAGNOSIS  To detect a cataract, an eye doctor examines the lens. Cataracts are best diagnosed with an exam of the eyes with the pupils enlarged (dilated) by drops.  TREATMENT  For an early cataract, vision may improve by using different eyeglasses or stronger lighting. If that does not help your vision, surgery is the only effective treatment. A cataract needs to be surgically removed when vision loss interferes with your everyday activities, such as driving, reading, or watching TV. A cataract may also have to be removed if it prevents examination or treatment of another eye problem. Surgery removes the cloudy lens and usually replaces it with a substitute lens (intraocular lens, IOL).  At a time when both you and your doctor agree, the cataract will be surgically removed. If you have cataracts in both eyes, only one is usually removed at a time. This allows the operated eye to heal and be out of danger from any possible problems after surgery (such as infection or poor wound healing). In rare cases, a cataract may be doing damage to your eye. In these cases, your caregiver may advise surgical removal right away. The vast majority of people who have cataract surgery have better vision afterward. HOME CARE INSTRUCTIONS  If you are not planning surgery, you may be asked to do the following:  Use different eyeglasses.   Use stronger or brighter  lighting.   Ask your eye doctor about reducing your medicine dose or changing medicines if it is thought that a medicine caused your cataract. Changing medicines does not make the cataract go away on its own.   Become familiar with your surroundings. Poor vision can lead to injury. Avoid bumping into things on the affected side. You are at a higher risk for tripping or falling.   Exercise extreme care when driving or operating machinery.   Wear sunglasses if you are sensitive to bright light or experiencing problems with glare.    SEEK IMMEDIATE MEDICAL CARE IF:   You have a worsening or sudden vision loss.   You notice redness, swelling, or increasing pain in the eye.   You have a fever.  Document Released: 11/11/2005 Document Revised: 10/31/2011 Document Reviewed: 07/05/2011 [facility] Patient Information 2012 [place], [place].PATIENT INSTRUCTIONS POST-ANESTHESIA  IMMEDIATELY FOLLOWING SURGERY:  Do not drive or operate machinery for the first twenty four hours after surgery.  Do not make any important decisions for twenty four hours after surgery or while taking narcotic pain medications or sedatives.  If you develop intractable nausea and vomiting or a severe headache please notify your doctor immediately.  FOLLOW-UP:  Please make an appointment with your surgeon as instructed. You do not need to follow up with anesthesia unless specifically instructed to do so.  WOUND CARE INSTRUCTIONS (if applicable):  Keep a dry clean dressing on the anesthesia/puncture wound site if there is drainage.  Once the wound has quit draining you may leave it open to air.  Generally you should leave the bandage intact for twenty four hours unless there is drainage.  If the epidural site drains for more than 36-48 hours please call the anesthesia department.  QUESTIONS?:  Please feel free to call your physician or the hospital operator if you have any questions, and they will be happy to assist you.

## 2012-07-13 ENCOUNTER — Encounter (HOSPITAL_COMMUNITY)
Admission: RE | Disposition: A | Discharge status: Home / Self Care | Payer: Self-pay | Source: Ambulatory Visit | Attending: Ophthalmology

## 2012-07-13 ENCOUNTER — Encounter (HOSPITAL_COMMUNITY): Payer: Self-pay | Admitting: Anesthesiology

## 2012-07-13 ENCOUNTER — Ambulatory Visit (HOSPITAL_COMMUNITY)
Admission: RE | Admit: 2012-07-13 | Discharge: 2012-07-13 | Disposition: A | Discharge status: Home / Self Care | Payer: BC Managed Care – PPO | Source: Ambulatory Visit | Attending: Ophthalmology | Admitting: Ophthalmology

## 2012-07-13 ENCOUNTER — Encounter (HOSPITAL_COMMUNITY): Payer: Self-pay | Admitting: *Deleted

## 2012-07-13 ENCOUNTER — Ambulatory Visit (HOSPITAL_COMMUNITY): Payer: BC Managed Care – PPO | Admitting: Anesthesiology

## 2012-07-13 DIAGNOSIS — Z01812 Encounter for preprocedural laboratory examination: Secondary | ICD-10-CM | POA: Insufficient documentation

## 2012-07-13 DIAGNOSIS — H269 Unspecified cataract: Secondary | ICD-10-CM | POA: Insufficient documentation

## 2012-07-13 DIAGNOSIS — Z01810 Encounter for preprocedural cardiovascular examination: Secondary | ICD-10-CM | POA: Insufficient documentation

## 2012-07-13 HISTORY — PX: CATARACT EXTRACTION W/PHACO: SHX586

## 2012-07-13 SURGERY — PHACOEMULSIFICATION, CATARACT, WITH IOL INSERTION
Anesthesia: Monitor Anesthesia Care | Site: Eye | Laterality: Right | Wound class: Clean

## 2012-07-13 MED ORDER — GATIFLOXACIN 0.5 % OP SOLN OPTIME - NO CHARGE
1.0000 [drp] | Freq: Once | OPHTHALMIC | Status: AC
  Administered 2012-07-13: 1 [drp] via OPHTHALMIC
  Filled 2012-07-13: qty 2.5

## 2012-07-13 MED ORDER — LIDOCAINE HCL 3.5 % OP GEL
OPHTHALMIC | Status: AC
  Filled 2012-07-13: qty 5

## 2012-07-13 MED ORDER — TETRACAINE 0.5 % OP SOLN OPTIME - NO CHARGE
OPHTHALMIC | Status: DC | PRN
  Administered 2012-07-13: 1 [drp] via OPHTHALMIC

## 2012-07-13 MED ORDER — MOXIFLOXACIN HCL 0.5 % OP SOLN - NO CHARGE: 1.0000 [drp] | Freq: Once | OPHTHALMIC | Status: DC

## 2012-07-13 MED ORDER — KETOROLAC TROMETHAMINE 0.4 % OP SOLN - NO CHARGE
1.0000 [drp] | Freq: Once | OPHTHALMIC | Status: AC
  Administered 2012-07-13: 1 [drp] via OPHTHALMIC
  Filled 2012-07-13: qty 5

## 2012-07-13 MED ORDER — MIDAZOLAM HCL 2 MG/2ML IJ SOLN
INTRAMUSCULAR | Status: AC
  Filled 2012-07-13: qty 2

## 2012-07-13 MED ORDER — EPINEPHRINE HCL 1 MG/ML IJ SOLN
INTRAOCULAR | Status: DC | PRN
  Administered 2012-07-13: 11:00:00

## 2012-07-13 MED ORDER — LIDOCAINE 3.5 % OP GEL OPTIME - NO CHARGE
OPHTHALMIC | Status: DC | PRN
  Administered 2012-07-13: 1 [drp] via OPHTHALMIC

## 2012-07-13 MED ORDER — BSS IO SOLN
INTRAOCULAR | Status: DC | PRN
  Administered 2012-07-13: 15 mL via INTRAOCULAR

## 2012-07-13 MED ORDER — NA HYALUR & NA CHOND-NA HYALUR 0.55-0.5 ML IO KIT
PACK | INTRAOCULAR | Status: DC | PRN
  Administered 2012-07-13: 1 via OPHTHALMIC

## 2012-07-13 MED ORDER — GATIFLOXACIN 0.5 % OP SOLN OPTIME - NO CHARGE
OPHTHALMIC | Status: DC | PRN
  Administered 2012-07-13: 1 [drp] via OPHTHALMIC

## 2012-07-13 MED ORDER — CYCLOPENTOLATE HCL 1 % OP SOLN
1.0000 [drp] | Freq: Once | OPHTHALMIC | Status: AC
  Administered 2012-07-13: 1 [drp] via OPHTHALMIC

## 2012-07-13 MED ORDER — LACTATED RINGERS IV SOLN
INTRAVENOUS | Status: DC
  Administered 2012-07-13: 10:00:00 via INTRAVENOUS

## 2012-07-13 MED ORDER — MIDAZOLAM HCL 2 MG/2ML IJ SOLN
1.0000 mg | INTRAMUSCULAR | Status: DC | PRN
  Administered 2012-07-13 (×2): 2 mg via INTRAVENOUS

## 2012-07-13 MED ORDER — TETRACAINE HCL 0.5 % OP SOLN
OPHTHALMIC | Status: AC
  Filled 2012-07-13: qty 2

## 2012-07-13 MED ORDER — TETRACAINE HCL 0.5 % OP SOLN
1.0000 [drp] | OPHTHALMIC | Status: AC
  Administered 2012-07-13: 1 [drp] via OPHTHALMIC

## 2012-07-13 SURGICAL SUPPLY — 27 items
CAPSULAR TENSION RING-AMO (OPHTHALMIC RELATED) IMPLANT
CLOTH BEACON ORANGE TIMEOUT ST (SAFETY) ×1 IMPLANT
GLOVE BIO SURGEON STRL SZ7.5 (GLOVE) ×1 IMPLANT
GLOVE BIOGEL M 6.5 STRL (GLOVE) IMPLANT
GLOVE BIOGEL PI IND STRL 6.5 (GLOVE) IMPLANT
GLOVE BIOGEL PI IND STRL 7.0 (GLOVE) IMPLANT
GLOVE BIOGEL PI INDICATOR 6.5 (GLOVE)
GLOVE BIOGEL PI INDICATOR 7.0 (GLOVE)
GLOVE ECLIPSE 6.5 STRL STRAW (GLOVE) IMPLANT
GLOVE ECLIPSE 7.5 STRL STRAW (GLOVE) IMPLANT
GLOVE EXAM NITRILE LRG STRL (GLOVE) IMPLANT
GLOVE EXAM NITRILE MD LF STRL (GLOVE) ×1 IMPLANT
GLOVE SKINSENSE NS SZ6.5 (GLOVE)
GLOVE SKINSENSE NS SZ7.0 (GLOVE)
GLOVE SKINSENSE STRL SZ6.5 (GLOVE) IMPLANT
GLOVE SKINSENSE STRL SZ7.0 (GLOVE) IMPLANT
INST SET CATARACT ~~LOC~~ (KITS) ×2 IMPLANT
KIT VITRECTOMY (OPHTHALMIC RELATED) IMPLANT
PAD ARMBOARD 7.5X6 YLW CONV (MISCELLANEOUS) ×1 IMPLANT
PROC W NO LENS (INTRAOCULAR LENS)
PROC W SPEC LENS (INTRAOCULAR LENS)
PROCESS W NO LENS (INTRAOCULAR LENS) IMPLANT
PROCESS W SPEC LENS (INTRAOCULAR LENS) IMPLANT
RING MALYGIN (MISCELLANEOUS) IMPLANT
SIGHTPATH CAT PROC W REG LENS (Ophthalmic Related) ×2 IMPLANT
VISCOELASTIC ADDITIONAL (OPHTHALMIC RELATED) IMPLANT
WATER STERILE IRR 250ML POUR (IV SOLUTION) ×1 IMPLANT

## 2012-07-13 NOTE — Op Note (Signed)
See scanned op note dated today  

## 2012-07-13 NOTE — Anesthesia Preprocedure Evaluation (Signed)
Anesthesia Evaluation  Patient identified by MRN, date of birth, ID band Patient awake    Reviewed: Allergy & Precautions, H&P , NPO status , Patient's Chart, lab work & pertinent test results  History of Anesthesia Complications Negative for: history of anesthetic complications  Airway Mallampati: I      Dental  (+) Teeth Intact   Pulmonary shortness of breath and with exertion, asthma ,  breath sounds clear to auscultation        Cardiovascular negative cardio ROS  Rhythm:Regular     Neuro/Psych  Headaches, PSYCHIATRIC DISORDERS Depression    GI/Hepatic   Endo/Other    Renal/GU      Musculoskeletal   Abdominal   Peds  Hematology   Anesthesia Other Findings   Reproductive/Obstetrics                           Anesthesia Physical Anesthesia Plan  ASA: II  Anesthesia Plan: MAC   Post-op Pain Management:    Induction: Intravenous  Airway Management Planned: Nasal Cannula  Additional Equipment:   Intra-op Plan:   Post-operative Plan:   Informed Consent: I have reviewed the patients History and Physical, chart, labs and discussed the procedure including the risks, benefits and alternatives for the proposed anesthesia with the patient or authorized representative who has indicated his/her understanding and acceptance.     Plan Discussed with:   Anesthesia Plan Comments:         Anesthesia Quick Evaluation  

## 2012-07-13 NOTE — Brief Op Note (Signed)
07/13/2012  1:24 PM  PATIENT:  [name]  46 y.o. female  PRE-OPERATIVE DIAGNOSIS:  cortical cataract right eye  POST-OPERATIVE DIAGNOSIS:  cortical cataract right eye  PROCEDURE:  Procedure(s): CATARACT EXTRACTION PHACO AND INTRAOCULAR LENS PLACEMENT (IOC)  SURGEON:  Surgeon(s): Susa Simmonds, MD  ASSISTANTS: Laurena Bering, CST   ANESTHESIA STAFF: Moshe Salisbury, CRNA - CRNA Laurene Footman, MD - Anesthesiologist  ANESTHESIA:   topical and MAC  REQUESTED LENS POWER: 23.5  LENS IMPLANT INFORMATION: Alcon [id number] s/n [id number] exp 05/2014  CUMULATIVE DISSIPATED ENERGY:1.33  INDICATIONS:see H&P  OP FINDINGS:dense NS  COMPLICATIONS:None  DICTATION #: n/a  PATIENT DISPOSITION:  Short Stay

## 2012-07-13 NOTE — Transfer of Care (Signed)
Immediate Anesthesia Transfer of Care Note  Patient: [name]  Procedure(s) Performed: Procedure(s) (LRB): CATARACT EXTRACTION PHACO AND INTRAOCULAR LENS PLACEMENT (IOC) (Right)  Patient Location: PACU and Short Stay  Anesthesia Type: MAC  Level of Consciousness: awake, alert  and oriented  Airway & Oxygen Therapy: Patient Spontanous Breathing  Post-op Assessment: Report given to PACU RN  Post vital signs: Reviewed  Complications: No apparent anesthesia complications

## 2012-07-13 NOTE — H&P (Signed)
I have reviewed the pre printed H&P, the patient was re-examined, and I have identified no significant interval changes in the patient's medical condition.  There is no change in the plan of care since the history and physical of record. 

## 2012-07-13 NOTE — Anesthesia Postprocedure Evaluation (Signed)
  Anesthesia Post-op Note  Patient: [name]  Procedure(s) Performed: Procedure(s) (LRB): CATARACT EXTRACTION PHACO AND INTRAOCULAR LENS PLACEMENT (IOC) (Right)  Patient Location: PACU and Short Stay  Anesthesia Type: MAC  Level of Consciousness: awake, alert  and oriented  Airway and Oxygen Therapy: Patient Spontanous Breathing  Post-op Pain: none  Post-op Assessment: Post-op Vital signs reviewed, Patient's Cardiovascular Status Stable, Respiratory Function Stable, Patent Airway and No signs of Nausea or vomiting  Post-op Vital Signs: Reviewed and stable  Complications: No apparent anesthesia complications

## 2012-07-16 ENCOUNTER — Encounter (HOSPITAL_COMMUNITY): Payer: Self-pay | Admitting: Ophthalmology

## 2012-07-29 ENCOUNTER — Other Ambulatory Visit: Payer: Self-pay | Admitting: Obstetrics and Gynecology

## 2012-07-29 DIAGNOSIS — N649 Disorder of breast, unspecified: Secondary | ICD-10-CM

## 2012-08-03 ENCOUNTER — Inpatient Hospital Stay
Admission: RE | Admit: 2012-08-03 | Discharge: 2012-08-03 | Payer: BC Managed Care – PPO | Source: Ambulatory Visit | Attending: Obstetrics and Gynecology | Admitting: Obstetrics and Gynecology

## 2012-08-04 ENCOUNTER — Ambulatory Visit
Admission: RE | Admit: 2012-08-04 | Discharge: 2012-08-04 | Disposition: A | Discharge status: Home / Self Care | Payer: BC Managed Care – PPO | Source: Ambulatory Visit | Attending: Obstetrics and Gynecology | Admitting: Obstetrics and Gynecology

## 2012-08-04 DIAGNOSIS — N649 Disorder of breast, unspecified: Secondary | ICD-10-CM

## 2012-08-05 ENCOUNTER — Encounter: Payer: Self-pay | Admitting: Family Medicine

## 2012-09-25 ENCOUNTER — Ambulatory Visit: Payer: BC Managed Care – PPO | Attending: Pulmonary Disease | Admitting: Sleep Medicine

## 2012-09-25 DIAGNOSIS — G4730 Sleep apnea, unspecified: Secondary | ICD-10-CM

## 2012-09-25 DIAGNOSIS — G4761 Periodic limb movement disorder: Secondary | ICD-10-CM | POA: Insufficient documentation

## 2012-09-29 NOTE — Procedures (Signed)
[facility] Rainbow Salman A. Gerilyn Pilgrim, MD     [email or web address]         LOCATION: SLEEP LAB FACILITY: [facility]  PHYSICIAN: Noha Milberger A. Gerilyn Pilgrim, M.D.   DATE OF STUDY: 09/25/2012  NOCTURNAL POLYSOMNOGRAM   REFERRING PHYSICIAN: Shaune Pollack, M.D..  INDICATIONS: This is a 46 year old presents with significant fatigue, snoring and insomnia. This has been done to evaluate for obstructive sleep apnea  syndrome.  MEDICATIONS: Wellbutrin, lisinopril, Topamax, Diflucan and Ventolin.   EPWORTH SLEEPINESS SCALE: 9.   BMI: 28.   ARCHITECTURAL SUMMARY: Total recording time was 419 minutes. Sleep efficiency 86%. Sleep latency 33 minutes. REM latency 81 minutes. Stage NI 5.4%, N2 63%, N3 12% and REM sleep 19%.  RESPIRATORY DATA:  Baseline oxygen saturation is 98%. The saturation is 84% during REM sleep. Diagnostic AHI is 3.1 and RDI 3.8. REM AHI 14.  LIMB MOVEMENT SUMMARY: PLM index 21.   ELECTROCARDIOGRAM SUMMARY: Average heart rate is 83  with no significant  dysrhythmias observed.   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  Kenyana Husak A. Gerilyn Pilgrim, M.D. Diplomat, Biomedical engineer of Sleep Medicine.

## 2012-12-03 ENCOUNTER — Encounter: Payer: Self-pay | Admitting: Family Medicine

## 2012-12-30 ENCOUNTER — Encounter (HOSPITAL_COMMUNITY): Payer: Self-pay | Admitting: Emergency Medicine

## 2012-12-30 ENCOUNTER — Emergency Department (HOSPITAL_COMMUNITY)
Admission: EM | Admit: 2012-12-30 | Discharge: 2012-12-30 | Disposition: A | Discharge status: Home / Self Care | Payer: BC Managed Care – PPO | Attending: Emergency Medicine | Admitting: Emergency Medicine

## 2012-12-30 DIAGNOSIS — R110 Nausea: Secondary | ICD-10-CM | POA: Insufficient documentation

## 2012-12-30 DIAGNOSIS — F329 Major depressive disorder, single episode, unspecified: Secondary | ICD-10-CM | POA: Insufficient documentation

## 2012-12-30 DIAGNOSIS — R0789 Other chest pain: Secondary | ICD-10-CM | POA: Insufficient documentation

## 2012-12-30 DIAGNOSIS — Z79899 Other long term (current) drug therapy: Secondary | ICD-10-CM | POA: Insufficient documentation

## 2012-12-30 DIAGNOSIS — J45909 Unspecified asthma, uncomplicated: Secondary | ICD-10-CM | POA: Insufficient documentation

## 2012-12-30 DIAGNOSIS — Z9849 Cataract extraction status, unspecified eye: Secondary | ICD-10-CM | POA: Insufficient documentation

## 2012-12-30 DIAGNOSIS — Z8679 Personal history of other diseases of the circulatory system: Secondary | ICD-10-CM | POA: Insufficient documentation

## 2012-12-30 DIAGNOSIS — F3289 Other specified depressive episodes: Secondary | ICD-10-CM | POA: Insufficient documentation

## 2012-12-30 DIAGNOSIS — R42 Dizziness and giddiness: Secondary | ICD-10-CM | POA: Insufficient documentation

## 2012-12-30 DIAGNOSIS — G43909 Migraine, unspecified, not intractable, without status migrainosus: Secondary | ICD-10-CM | POA: Insufficient documentation

## 2012-12-30 LAB — BASIC METABOLIC PANEL
Calcium: 9.7 mg/dL (ref 8.4–10.5)
Creatinine, Ser: 1.02 mg/dL (ref 0.50–1.10)
GFR calc non Af Amer: 63 mL/min — ABNORMAL LOW (ref >90)
Glucose, Bld: 106 mg/dL — ABNORMAL HIGH (ref 70–99)
Sodium: 139 mEq/L (ref 135–145)

## 2012-12-30 LAB — TROPONIN I: Troponin I: <0.3 ng/mL (ref <0.30)

## 2012-12-30 LAB — CBC
MCH: 31 pg (ref 26.0–34.0)
MCHC: 33.8 g/dL (ref 30.0–36.0)
Platelets: 303 10*3/uL (ref 150–400)

## 2012-12-30 MED ORDER — ACETAMINOPHEN 500 MG PO TABS
ORAL_TABLET | ORAL | Status: AC
  Filled 2012-12-30: qty 2

## 2012-12-30 MED ORDER — NITROGLYCERIN 0.4 MG SL SUBL
0.4000 mg | SUBLINGUAL_TABLET | Freq: Once | SUBLINGUAL | Status: AC
  Administered 2012-12-30: 0.4 mg via SUBLINGUAL
  Filled 2012-12-30: qty 25

## 2012-12-30 MED ORDER — METHOCARBAMOL 500 MG PO TABS: 1000.0000 mg | ORAL_TABLET | Freq: Four times a day (QID) | ORAL | Status: DC

## 2012-12-30 MED ORDER — CYCLOBENZAPRINE HCL 10 MG PO TABS: 10.0000 mg | ORAL_TABLET | Freq: Once | ORAL | Status: DC

## 2012-12-30 MED ORDER — ACETAMINOPHEN 500 MG PO TABS
1000.0000 mg | ORAL_TABLET | Freq: Once | ORAL | Status: AC
  Administered 2012-12-30: 1000 mg via ORAL

## 2012-12-30 MED ORDER — LORAZEPAM 1 MG PO TABS
1.0000 mg | ORAL_TABLET | Freq: Once | ORAL | Status: AC
Start: 2012-12-30 — End: 2012-12-30
  Administered 2012-12-30: 1 mg via ORAL
  Filled 2012-12-30: qty 1

## 2012-12-30 MED ORDER — METHOCARBAMOL 500 MG PO TABS
1000.0000 mg | ORAL_TABLET | Freq: Once | ORAL | Status: AC
  Administered 2012-12-30: 1000 mg via ORAL
  Filled 2012-12-30: qty 2

## 2012-12-30 NOTE — ED Provider Notes (Signed)
History   This chart was scribed for Ward Givens, MD by Charolett Bumpers, ED Scribe. The patient was seen in room [facility]. Patient's care was started at 1022.   CSN: [id number]  Arrival date & time 12/30/12  0958   First MD Initiated Contact with Patient 12/30/12 1022      Chief Complaint  Patient presents with  . Chest Pain    The history is provided by the patient. No language interpreter was used.  [name] is a 47 y.o. female who presents to the Emergency Department complaining of waxing and waning central chest pain that that started at 8 am this morning. The pain is described as burning, sharp and tight at times that radiates into her neck. She rates her pain 4/10 currently and 10/10 at it's worse. She states that her first episode of chest pain started yesterday after lunch that resolved after 45 minutes. She states the pain was the same as today and hadn't eaten anything prior. She denies any h/o chest pain previously. She reports associated nausea and lightheadedness. She denies any modifying factors. She took antacids without relief. She denies any SOB, vomiting, palpitations. She denies excessive caffeine use. She states that she has a h/o GERD and Nissan fundoplication. She denies any h/o DM. She reports she was started on Lisinopril after her cataract surgery on 07/13/12 but was taken off after BP dropped to low.   She has FH of CHF and a MGM did have a MI  She reports excess stress with her work. She works with [employer] with long hours and lots of paperwork when she gets home at night, sometimes staying up until MN.  PCP: Dr. Juanetta Gosling  Past Medical History  Diagnosis Date  . Migraine   . Asthma   . Allergy   . Varicose veins   . Cataracts, bilateral   . Shortness of breath     SOB with exertion  . Depression     Past Surgical History  Procedure Date  . Septoplasty   . Tubal ligation   . Abdominal hysterectomy   . Replacement total knee  bilateral   . Nissen fundoplication   . Endovenous ablation saphenous vein w/ laser 12-12-2011    left greater saphenous vein     . Colonoscopy 03/12/2012    Procedure: COLONOSCOPY;  Surgeon: Malissa Hippo, MD;  Location: [facility];  Service: Endoscopy;  Laterality: N/A;  1200  . Tonsillectomy   . Cataract extraction w/phaco 07/13/2012    Procedure: CATARACT EXTRACTION PHACO AND INTRAOCULAR LENS PLACEMENT (IOC);  Surgeon: Susa Simmonds, MD;  Location: [facility];  Service: Ophthalmology;  Laterality: Right;  CDE:1.33  . Cataract extraction     Family History  Problem Relation Age of Onset  . Diabetes Mother   . Depression Mother   . Diabetes type II Mother   . Migraines Sister   . Other Sister     migranes  . Depression Sister   . Fibromyalgia Sister   . Hypertension Sister   . Hypertension Father   . Heart failure Father   Maternal grandmother hx of MI, RAD Paternal grandfather hx of CHF, HTN FOP HTN, CHF MOP HTN, AODM  History  Substance Use Topics  . Smoking status: Former Smoker -- 2 years    Types: Cigarettes    Quit date: 07/23/1981  . Smokeless tobacco: Never Used  . Alcohol Use: Yes     Comment: occassional glass of  wine or beer  Former smoker Occasional alcohol use Lives with husband employed   Review of Systems  Respiratory: Negative for cough and shortness of breath.   Cardiovascular: Positive for chest pain. Negative for palpitations.  Gastrointestinal: Positive for nausea. Negative for vomiting.  Neurological: Positive for light-headedness. Negative for syncope.  All other systems reviewed and are negative.    Allergies  Hydrocodone; Aminophylline; Nystatin; Penicillins; and Sulfamethoxazole w-trimethoprim  Home Medications   Current Outpatient Rx  Name  Route  Sig  Dispense  Refill  . ACETAMINOPHEN 500 MG PO TABS   Oral   Take 1,000 mg by mouth every 6 (six) hours as needed. For pain         . ALBUTEROL SULFATE HFA 108 (90 BASE)  MCG/ACT IN AERS   Inhalation   Inhale 2 puffs into the lungs every 6 (six) hours as needed. For shortness of breath         . BECLOMETHASONE DIPROPIONATE 80 MCG/ACT IN AERS   Inhalation   Inhale 2 puffs into the lungs 2 (two) times daily.         . BUPROPION HCL ER (SR) 150 MG PO TB12   Oral   Take 150 mg by mouth 2 (two) times daily.         Marland Kitchen BUTORPHANOL TARTRATE 10 MG/ML NA SOLN   Nasal   Place 1 spray into the nose 2 (two) times daily as needed. For migraines          . CETIRIZINE HCL 10 MG PO TABS   Oral   Take 10 mg by mouth daily.         . CYCLOBENZAPRINE HCL 10 MG PO TABS   Oral   Take 10 mg by mouth 3 (three) times daily as needed. For migraines         . DM-GUAIFENESIN ER 30-600 MG PO TB12   Oral   Take 1 tablet by mouth as needed. For congestion         . FLUCONAZOLE 150 MG PO TABS   Oral   Take 150 mg by mouth as needed. When taking Antibiotics         . IBUPROFEN 200 MG PO TABS   Oral   Take 400 mg by mouth every 6 (six) hours as needed. For pain         . METRONIDAZOLE 500 MG PO TABS   Oral   Take 500 mg by mouth as needed. When taking antibiotic         . MOMETASONE FURO-FORMOTEROL FUM 100-5 MCG/ACT IN AERO   Inhalation   Inhale 2 puffs into the lungs 2 (two) times daily.         Marland Kitchen MONTELUKAST SODIUM 10 MG PO TABS   Oral   Take 10 mg by mouth at bedtime.         . OLOPATADINE HCL 0.6 % NA SOLN   Nasal   Place 1 puff into the nose at bedtime.         . OXYCODONE-ACETAMINOPHEN 5-325 MG PO TABS   Oral   Take 1 tablet by mouth every 4 (four) hours as needed. For migraine         . PRAMIPEXOLE DIHYDROCHLORIDE 0.25 MG PO TABS   Oral   Take 0.25 mg by mouth every evening.         Marland Kitchen RIZATRIPTAN BENZOATE 10 MG PO TABS   Oral   Take 10 mg by mouth  as needed. May repeat in 2 hours if needed          . SERTRALINE HCL 50 MG PO TABS   Oral   Take 50 mg by mouth daily.         . TOPIRAMATE 200 MG PO TABS   Oral    Take 200 mg by mouth 2 (two) times daily.             Triage Vitals: BP 130/85  Pulse 104  Temp 98.1 F (36.7 C) (Oral)  Resp 20  Ht 5\' 4"  (1.626 m)  Wt 164 lb (74.39 kg)  BMI 28.15 kg/m2  SpO2 100%  Vital signs normal except tachycardia   Physical Exam  Nursing note and vitals reviewed. Constitutional: She is oriented to person, place, and time. She appears well-developed and well-nourished. No distress.  HENT:  Head: Normocephalic and atraumatic.  Right Ear: External ear normal.  Left Ear: External ear normal.  Nose: Nose normal.  Mouth/Throat: Oropharynx is clear and moist. No oropharyngeal exudate.  Eyes: Conjunctivae normal and EOM are normal. Pupils are equal, round, and reactive to light.  Neck: Normal range of motion. Neck supple. No tracheal deviation present.  Cardiovascular: Normal rate, regular rhythm and normal heart sounds.  Exam reveals no gallop and no friction rub.   No murmur heard. Pulmonary/Chest: Effort normal and breath sounds normal. No respiratory distress. She has no wheezes. She has no rhonchi. She has no rales. She exhibits no tenderness.       Chest pain is not reproducible with palpation.   Abdominal: Soft. Bowel sounds are normal. She exhibits no distension. There is no tenderness. There is no rebound and no guarding.  Musculoskeletal: Normal range of motion. She exhibits no edema and no tenderness.  Neurological: She is alert and oriented to person, place, and time.  Skin: Skin is warm and dry.  Psychiatric: She has a normal mood and affect. Her behavior is normal.    ED Course  Procedures (including critical care time)   Medications  LORazepam (ATIVAN) tablet 1 mg (1 mg Oral Given 12/30/12 1048)  nitroGLYCERIN (NITROSTAT) SL tablet 0.4 mg (0.4 mg Sublingual Given 12/30/12 1050)  methocarbamol (ROBAXIN) tablet 1,000 mg (1000 mg Oral Given 12/30/12 1320)  acetaminophen (TYLENOL) tablet 1,000 mg (1000 mg Oral Given 12/30/12 1225)    DIAGNOSTIC  STUDIES: Oxygen Saturation is 100% on room air, normal by my interpretation.    COORDINATION OF CARE:  10:45-Discussed planned course of treatment with the patient including basic blood work, who is agreeable at this time.   Patient's pain slowly improved. She still had some minor pain at time of discharge. She states she felt like the Ativan did not help and that she has a high tolerance to this type of medications. We discussed her chest pain today he did not appear to be cardiac however she should consider getting a stress test done. Patient's pain is very atypical for coronary artery disease. Her symptoms are more classic for reflux however she's had a Nissan fundoplication and she states she can't  even have vomiting.   Results for orders placed during the hospital encounter of 12/30/12  TROPONIN I      Component Value Range   Troponin I <0.30  <0.30 ng/mL  CBC      Component Value Range   WBC 8.2  4.0 - 10.5 K/uL   RBC 4.97  3.87 - 5.11 MIL/uL   Hemoglobin 15.4 (*) 12.0 - 15.0  g/dL   HCT 40.9  81.1 - 91.4 %   MCV 91.8  78.0 - 100.0 fL   MCH 31.0  26.0 - 34.0 pg   MCHC 33.8  30.0 - 36.0 g/dL   RDW 78.2  95.6 - 21.3 %   Platelets 303  150 - 400 K/uL  BASIC METABOLIC PANEL      Component Value Range   Sodium 139  135 - 145 mEq/L   Potassium 3.7  3.5 - 5.1 mEq/L   Chloride 107  96 - 112 mEq/L   CO2 25  19 - 32 mEq/L   Glucose, Bld 106 (*) 70 - 99 mg/dL   BUN 12  6 - 23 mg/dL   Creatinine, Ser 0.86  0.50 - 1.10 mg/dL   Calcium 9.7  8.4 - 57.8 mg/dL   GFR calc non Af Amer 63 (*) >90 mL/min   GFR calc Af Amer 74 (*) >90 mL/min  TROPONIN I      Component Value Range   Troponin I <0.30  <0.30 ng/mL   Laboratory interpretation all normal    Date: 12/30/2012  Rate: 103  Rhythm: sinus tachycardia  QRS Axis: normal  Intervals: normal  ST/T Wave abnormalities: normal  Conduction Disutrbances:none  Narrative Interpretation: small q waves laterally  Old EKG Reviewed:  unchanged frp, [id number]     1. Chest pain, atypical     New Prescriptions   METHOCARBAMOL (ROBAXIN) 500 MG TABLET    Take 2 tablets (1,000 mg total) by mouth 4 (four) times daily.    Plan discharge  Devoria Albe, MD, FACEP   MDM    I personally performed the services described in this documentation, which was scribed in my presence. The recorded information has been reviewed and considered.  Devoria Albe, MD, Armando Gang       Ward Givens, MD 12/30/12 [phone]

## 2012-12-30 NOTE — ED Notes (Signed)
First episode of chest pain started yesterday around lunch. Patient states episode lasted approx 30 minutes. Today after getting ready for work chest pain started again. Describes as burning pressure central chest. Pt states she has headache right now. Also states she has been under a lot of stress lately. Pt somewhat tearful during triage.

## 2013-01-01 ENCOUNTER — Encounter: Payer: Self-pay | Admitting: Adult Health

## 2013-01-01 ENCOUNTER — Ambulatory Visit (INDEPENDENT_AMBULATORY_CARE_PROVIDER_SITE_OTHER): Payer: BC Managed Care – PPO | Admitting: Adult Health

## 2013-01-01 VITALS — BP 135/88 | HR 81 | Ht 64.0 in | Wt 162.0 lb

## 2013-01-01 DIAGNOSIS — R079 Chest pain, unspecified: Secondary | ICD-10-CM

## 2013-01-01 DIAGNOSIS — R0602 Shortness of breath: Secondary | ICD-10-CM

## 2013-01-01 NOTE — Patient Instructions (Addendum)
Your physician recommends that you schedule a follow-up appointment in: As needed  Stress Echo

## 2013-01-01 NOTE — Progress Notes (Deleted)
Name: [name]    DOB: 07/05/1966  Age: 47 y.o.  MR#: [id number]       PCP:  Fredirick Maudlin, MD      Insurance: @PAYORNAME @   CC:   No chief complaint on file.   VS BP 135/88  Pulse 81  Ht 5\' 4"  (1.626 m)  Wt 162 lb (73.483 kg)  BMI 27.81 kg/m2  Weights Current Weight  01/01/13 162 lb (73.483 kg)  12/30/12 164 lb (74.39 kg)  07/07/12 166 lb (75.297 kg)    Blood Pressure  BP Readings from Last 3 Encounters:  01/01/13 135/88  12/30/12 119/73  07/13/12 145/85     Admit date:  (Not on file) Last encounter with RMR:  Visit date not found   Allergy Allergies  Allergen Reactions  . Hydrocodone Itching    Itching,   . Aminophylline Rash  . Nystatin Rash  . Penicillins Rash  . Sulfamethoxazole W-Trimethoprim Rash    Pt has taken before but DS causes rash. Single strength does not    Current Outpatient Prescriptions  Medication Sig Dispense Refill  . acetaminophen (TYLENOL) 500 MG tablet Take 1,000 mg by mouth every 6 (six) hours as needed. For pain      . albuterol (VENTOLIN HFA) 108 (90 BASE) MCG/ACT inhaler Inhale 2 puffs into the lungs every 6 (six) hours as needed. For shortness of breath      . beclomethasone (QVAR) 80 MCG/ACT inhaler Inhale 2 puffs into the lungs 2 (two) times daily.      Marland Kitchen buPROPion (WELLBUTRIN SR) 150 MG 12 hr tablet Take 150 mg by mouth 2 (two) times daily.      . butorphanol (STADOL) 10 MG/ML nasal spray Place 1 spray into the nose 2 (two) times daily as needed. For migraines       . cetirizine (ZYRTEC) 10 MG tablet Take 10 mg by mouth daily.      . cyclobenzaprine (FLEXERIL) 10 MG tablet Take 10 mg by mouth 3 (three) times daily as needed. For migraines      . dextromethorphan-guaiFENesin (MUCINEX DM) 30-600 MG per 12 hr tablet Take 1 tablet by mouth as needed. For congestion      . fluconazole (DIFLUCAN) 150 MG tablet Take 150 mg by mouth as needed. When taking Antibiotics      . ibuprofen (ADVIL,MOTRIN) 200 MG tablet Take 400 mg by mouth  every 6 (six) hours as needed. For pain      . metroNIDAZOLE (FLAGYL) 500 MG tablet Take 500 mg by mouth as needed. When taking antibiotic      . mometasone-formoterol (DULERA) 100-5 MCG/ACT AERO Inhale 2 puffs into the lungs 2 (two) times daily.      . montelukast (SINGULAIR) 10 MG tablet Take 10 mg by mouth at bedtime.      . Olopatadine HCl (PATANASE) 0.6 % SOLN Place 1 puff into the nose at bedtime.      Marland Kitchen oxyCODONE-acetaminophen (PERCOCET) 5-325 MG per tablet Take 1 tablet by mouth every 4 (four) hours as needed. For migraine      . pramipexole (MIRAPEX) 0.25 MG tablet Take 0.25 mg by mouth every evening.      . rizatriptan (MAXALT) 10 MG tablet Take 10 mg by mouth as needed. May repeat in 2 hours if needed       . topiramate (TOPAMAX) 200 MG tablet Take 200 mg by mouth 2 (two) times daily.        . sertraline (ZOLOFT)  50 MG tablet Take 50 mg by mouth daily.        Discontinued Meds:    Medications Discontinued During This Encounter  Medication Reason  . methocarbamol (ROBAXIN) 500 MG tablet Discontinued by provider    Patient Active Problem List  Diagnosis  . ASTHMA  . KNEE, ARTHRITIS, DEGEN./OSTEO  . TOTAL KNEE FOLLOW-UP  . Varicose veins of lower extremities with other complications    LABS Admission on 12/30/2012, Discharged on 12/30/2012  Component Date Value  . Troponin I 12/30/2012 <0.30   . WBC 12/30/2012 8.2   . RBC 12/30/2012 4.97   . Hemoglobin 12/30/2012 15.4*  . HCT 12/30/2012 45.6   . MCV 12/30/2012 91.8   . MCH 12/30/2012 31.0   . MCHC 12/30/2012 33.8   . RDW 12/30/2012 13.1   . Platelets 12/30/2012 303   . Sodium 12/30/2012 139   . Potassium 12/30/2012 3.7   . Chloride 12/30/2012 107   . CO2 12/30/2012 25   . Glucose, Bld 12/30/2012 106*  . BUN 12/30/2012 12   . Creatinine, Ser 12/30/2012 1.02   . Calcium 12/30/2012 9.7   . GFR calc non Af Amer 12/30/2012 63*  . GFR calc Af Amer 12/30/2012 74*  . Troponin I 12/30/2012 <0.30      Results for  this Opt Visit:     Results for orders placed during the hospital encounter of 12/30/12  TROPONIN I      Component Value Range   Troponin I <0.30  <0.30 ng/mL  CBC      Component Value Range   WBC 8.2  4.0 - 10.5 K/uL   RBC 4.97  3.87 - 5.11 MIL/uL   Hemoglobin 15.4 (*) 12.0 - 15.0 g/dL   HCT 04.5  40.9 - 81.1 %   MCV 91.8  78.0 - 100.0 fL   MCH 31.0  26.0 - 34.0 pg   MCHC 33.8  30.0 - 36.0 g/dL   RDW 91.4  78.2 - 95.6 %   Platelets 303  150 - 400 K/uL  BASIC METABOLIC PANEL      Component Value Range   Sodium 139  135 - 145 mEq/L   Potassium 3.7  3.5 - 5.1 mEq/L   Chloride 107  96 - 112 mEq/L   CO2 25  19 - 32 mEq/L   Glucose, Bld 106 (*) 70 - 99 mg/dL   BUN 12  6 - 23 mg/dL   Creatinine, Ser 2.13  0.50 - 1.10 mg/dL   Calcium 9.7  8.4 - 08.6 mg/dL   GFR calc non Af Amer 63 (*) >90 mL/min   GFR calc Af Amer 74 (*) >90 mL/min  TROPONIN I      Component Value Range   Troponin I <0.30  <0.30 ng/mL    EKG Orders placed in visit on 01/01/13  . EKG 12-LEAD     Prior Assessment and Plan Problem List as of 01/01/2013            Cardiology Problems   Varicose veins of lower extremities with other complications     Other   ASTHMA   KNEE, ARTHRITIS, DEGEN./OSTEO   TOTAL KNEE FOLLOW-UP       Imaging: No results found.   FRS Calculation: Score not calculated. Missing: Total Cholesterol, HDL

## 2013-01-01 NOTE — Assessment & Plan Note (Signed)
Pain with typical and atypical features, negative troponin, unremarkable labs in ER. EKG with nonspecific T-wave abnormality in the lateral leads. Recommend nuclear stress test for diagnostic/prognostic purposes and echocardiogram for LV fx in the setting of asthma. Continue current medications, and at Rx for NTG for recurrent pain.

## 2013-01-01 NOTE — Addendum Note (Signed)
Addended by: Reather Laurence A on: 01/01/2013 02:38 PM   Modules accepted: Orders

## 2013-01-01 NOTE — Progress Notes (Signed)
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 She has no prior cardiac history, with ongoing history of asthma, depression, migraine headaches and DOE. She presented to ER 2 days ago after meeting with a patient and having substernal chest pressure and fullness radiating up into the neck, lasting 30 minutes rated 7/10 in intensity. Mild shortness of breath, but no weakness or diaphoresis. She remained sore the rest of the day, but was able to complete her patient visits. The following morning after taking usual medications, she again began to feel chest fullness waxing and waning radiating up into her neck. Sought treatment in the ER where she was given NTG X2, ativan and robaxin. She was relieved of symptoms but asked to follow up with cardiology. Labs and CXR were unremarkable.  EKG revealed sinus tachycardia, rate of 103 bpm with lateral lead abnormalities.   Allergies  Allergen Reactions  . Hydrocodone Itching    Itching,   . Aminophylline Rash  . Nystatin Rash  . Penicillins Rash  . Sulfamethoxazole W-Trimethoprim Rash    Pt has taken before but DS causes rash. Single strength does not    Current Outpatient Prescriptions  Medication Sig Dispense Refill  . acetaminophen (TYLENOL) 500 MG tablet Take 1,000 mg by mouth every 6 (six) hours as needed. For pain      . albuterol (VENTOLIN HFA) 108 (90 BASE) MCG/ACT inhaler Inhale 2 puffs into the lungs every 6 (six) hours as needed. For shortness of breath      . beclomethasone (QVAR) 80 MCG/ACT inhaler Inhale 2 puffs into the lungs 2 (two) times daily.      Marland Kitchen buPROPion (WELLBUTRIN SR) 150 MG 12 hr tablet Take 150 mg by mouth 2 (two) times daily.      . butorphanol (STADOL) 10 MG/ML nasal spray Place 1 spray into the nose 2 (two) times daily as needed. For migraines       . cetirizine (ZYRTEC) 10 MG tablet Take 10 mg by mouth daily.      . cyclobenzaprine (FLEXERIL) 10 MG tablet Take 10  mg by mouth 3 (three) times daily as needed. For migraines      . dextromethorphan-guaiFENesin (MUCINEX DM) 30-600 MG per 12 hr tablet Take 1 tablet by mouth as needed. For congestion      . fluconazole (DIFLUCAN) 150 MG tablet Take 150 mg by mouth as needed. When taking Antibiotics      . ibuprofen (ADVIL,MOTRIN) 200 MG tablet Take 400 mg by mouth every 6 (six) hours as needed. For pain      . metroNIDAZOLE (FLAGYL) 500 MG tablet Take 500 mg by mouth as needed. When taking antibiotic      . mometasone-formoterol (DULERA) 100-5 MCG/ACT AERO Inhale 2 puffs into the lungs 2 (two) times daily.      . montelukast (SINGULAIR) 10 MG tablet Take 10 mg by mouth at bedtime.      . Olopatadine HCl (PATANASE) 0.6 % SOLN Place 1 puff into the nose at bedtime.      Marland Kitchen oxyCODONE-acetaminophen (PERCOCET) 5-325 MG per tablet Take 1 tablet by mouth every 4 (four) hours as needed. For migraine      . pramipexole (MIRAPEX) 0.25 MG tablet Take 0.25 mg by mouth every evening.      . rizatriptan (MAXALT) 10 MG tablet Take 10 mg by mouth as needed. May repeat in 2 hours if needed       .  topiramate (TOPAMAX) 200 MG tablet Take 200 mg by mouth 2 (two) times daily.        . sertraline (ZOLOFT) 50 MG tablet Take 50 mg by mouth daily.        Past Medical History  Diagnosis Date  . Migraine   . Asthma   . Allergy   . Varicose veins   . Cataracts, bilateral   . Shortness of breath     SOB with exertion  . Depression     Past Surgical History  Procedure Date  . Septoplasty   . Tubal ligation   . Abdominal hysterectomy   . Replacement total knee bilateral   . Nissen fundoplication   . Endovenous ablation saphenous vein w/ laser 12-12-2011    left greater saphenous vein     . Colonoscopy 03/12/2012    Procedure: COLONOSCOPY;  Surgeon: Malissa Hippo, MD;  Location: [facility];  Service: Endoscopy;  Laterality: N/A;  1200  . Tonsillectomy   . Cataract extraction w/phaco 07/13/2012    Procedure: CATARACT  EXTRACTION PHACO AND INTRAOCULAR LENS PLACEMENT (IOC);  Surgeon: Susa Simmonds, MD;  Location: [facility];  Service: Ophthalmology;  Laterality: Right;  CDE:1.33  . Cataract extraction     WUJ:WJXBJY of systems complete and found to be negative unless listed above  PHYSICAL EXAM BP 135/88  Pulse 81  Ht 5\' 4"  (1.626 m)  Wt 162 lb (73.483 kg)  BMI 27.81 kg/m2  General: Well developed, well nourished, in no acute distress Head: Eyes PERRLA, No xanthomas.   Normal cephalic and atramatic  Lungs: Clear bilaterally to auscultation and percussion. Heart: HRRR S1 S2, without MRG.  Pulses are 2+ & equal.            No carotid bruit. No JVD.  No abdominal bruits. No femoral bruits. Abdomen: Bowel sounds are positive, abdomen soft and non-tender without masses or                  Hernia's noted. Msk:  Back normal, normal gait. Normal strength and tone for age. Extremities: No clubbing, cyanosis or edema.  DP +1 Neuro: Alert and oriented X 3. Psych:  Good affect, responds appropriately  EKG: Sinus tachycardia, rate of 92 bpm,with nonspecific T-wave abnormalities in the lateral leads.   ASSESSMENT AND PLAN

## 2013-01-01 NOTE — Progress Notes (Signed)
Patient ID: [name], female   DOB: Dec 13, 1965, 47 y.o.   MRN: [id number]  Patient interviewed and examined. She describes considerable stress and appears anxious. She is concerned that chest pain resolved after demonstration nitroglycerin, but this is not a reliable diagnostic test for coronary artery disease. In the absence of any risk factors and the presence of atypical chest discomfort, likelihood of obstructive disease is remote. We will proceed with a stress echocardiogram to verify normal left ventricular systolic function and to exclude provokable ischemia.

## 2013-01-04 NOTE — Patient Instructions (Signed)
[name]  01/04/2013   Your procedure is scheduled on: 01/11/13  Report to [facility] at [place] AM.  Call this number if you have problems the morning of surgery: [phone]   Remember:   Do not eat food or drink liquids after midnight.   Take these medicines the morning of surgery with A SIP OF WATER: ventolin, wellbutin, zyrtec, dulera, singulair, qvar, zoloft   Do not wear jewelry, make-up or nail polish.  Do not wear lotions, powders, or perfumes. You may wear deodorant.  Do not shave 48 hours prior to surgery. Men may shave face and neck.  Do not bring valuables to the hospital.  Contacts, dentures or bridgework may not be worn into surgery.  Leave suitcase in the car. After surgery it may be brought to your room.  For patients admitted to the hospital, checkout time is 11:00 AM the day of  discharge.   Patients discharged the day of surgery will not be allowed to drive  home.  Name and phone number of your driver:family  Special Instructions: N/A   Please read over the following fact sheets that you were given: Anesthesia Post-op Instructions and Care and Recovery After Surgery   PATIENT INSTRUCTIONS POST-ANESTHESIA  IMMEDIATELY FOLLOWING SURGERY:  Do not drive or operate machinery for the first twenty four hours after surgery.  Do not make any important decisions for twenty four hours after surgery or while taking narcotic pain medications or sedatives.  If you develop intractable nausea and vomiting or a severe headache please notify your doctor immediately.  FOLLOW-UP:  Please make an appointment with your surgeon as instructed. You do not need to follow up with anesthesia unless specifically instructed to do so.  WOUND CARE INSTRUCTIONS (if applicable):  Keep a dry clean dressing on the anesthesia/puncture wound site if there is drainage.  Once the wound has quit draining you may leave it open to air.  Generally you should leave the bandage intact for twenty four hours  unless there is drainage.  If the epidural site drains for more than 36-48 hours please call the anesthesia department.  QUESTIONS?:  Please feel free to call your physician or the hospital operator if you have any questions, and they will be happy to assist you.      Cataract Surgery  A cataract is a clouding of the lens of the eye. When a lens becomes cloudy, vision is reduced based on the degree and nature of the clouding. Surgery may be needed to improve vision. Surgery removes the cloudy lens and usually replaces it with a substitute lens (intraocular lens, IOL). LET YOUR EYE DOCTOR KNOW ABOUT:  Allergies to food or medicine.  Medicines taken including herbs, eyedrops, over-the-counter medicines, and creams.  Use of steroids (by mouth or creams).  Previous problems with anesthetics or numbing medicine.  History of bleeding problems or blood clots.  Previous surgery.  Other health problems, including diabetes and kidney problems.  Possibility of pregnancy, if this applies. RISKS AND COMPLICATIONS  Infection.  Inflammation of the eyeball (endophthalmitis) that can spread to both eyes (sympathetic ophthalmia).  Poor wound healing.  If an IOL is inserted, it can later fall out of proper position. This is very uncommon.  Clouding of the part of your eye that holds an IOL in place. This is called an "after-cataract." These are uncommon, but easily treated. BEFORE THE PROCEDURE  Do not eat or drink anything except small amounts of water for 8 to 12  before your surgery, or as directed by your caregiver.  Unless you are told otherwise, continue any eyedrops you have been prescribed.  Talk to your primary caregiver about all other medicines that you take (both prescription and non-prescription). In some cases, you may need to stop or change medicines near the time of your surgery. This is most important if you are taking blood-thinning medicine.Do not stop medicines unless you  are told to do so.  Arrange for someone to drive you to and from the procedure.  Do not put contact lenses in either eye on the day of your surgery. PROCEDURE There is more than one method for safely removing a cataract. Your doctor can explain the differences and help determine which is best for you. Phacoemulsification surgery is the most common form of cataract surgery.  An injection is given behind the eye or eyedrops are given to make this a painless procedure.  A small cut (incision) is made on the edge of the clear, dome-shaped surface that covers the front of the eye (cornea).  A tiny probe is painlessly inserted into the eye. This device gives off ultrasound waves that soften and break up the cloudy center of the lens. This makes it easier for the cloudy lens to be removed by suction.  An IOL may be implanted.  The normal lens of the eye is covered by a clear capsule. Part of that capsule is intentionally left in the eye to support the IOL.  Your surgeon may or may not use stitches to close the incision. There are other forms of cataract surgery that require a larger incision and stiches to close the eye. This approach is taken in cases where the doctor feels that the cataract cannot be easily removed using phacoemulsification. AFTER THE PROCEDURE  When an IOL is implanted, it does not need care. It becomes a permanent part of your eye and cannot be seen or felt.  Your doctor will schedule follow-up exams to check on your progress.  Review your other medicines with your doctor to see which can be resumed after surgery.  Use eyedrops or take medicine as prescribed by your doctor. Document Released: 10/31/2011 Document Revised: 02/03/2012 Document Reviewed: 10/31/2011 [facility] Patient Information 2013 [place], [place].

## 2013-01-05 ENCOUNTER — Encounter (HOSPITAL_COMMUNITY): Payer: Self-pay

## 2013-01-05 ENCOUNTER — Encounter (HOSPITAL_COMMUNITY)
Admission: RE | Admit: 2013-01-05 | Discharge: 2013-01-05 | Disposition: A | Discharge status: Home / Self Care | Payer: BC Managed Care – PPO | Source: Ambulatory Visit | Attending: Ophthalmology | Admitting: Ophthalmology

## 2013-01-05 ENCOUNTER — Encounter (HOSPITAL_COMMUNITY): Payer: Self-pay | Admitting: Pharmacy Technician

## 2013-01-05 HISTORY — DX: Restless legs syndrome: G25.81

## 2013-01-05 MED ORDER — CYCLOPENTOLATE-PHENYLEPHRINE 0.2-1 % OP SOLN
OPHTHALMIC | Status: AC
  Filled 2013-01-05: qty 2

## 2013-01-08 ENCOUNTER — Encounter: Payer: BC Managed Care – PPO | Admitting: Cardiology

## 2013-01-11 ENCOUNTER — Ambulatory Visit (HOSPITAL_COMMUNITY)
Admission: RE | Admit: 2013-01-11 | Discharge: 2013-01-11 | Disposition: A | Discharge status: Home / Self Care | Payer: BC Managed Care – PPO | Source: Ambulatory Visit | Attending: Ophthalmology | Admitting: Ophthalmology

## 2013-01-11 ENCOUNTER — Encounter (HOSPITAL_COMMUNITY): Payer: Self-pay | Admitting: *Deleted

## 2013-01-11 ENCOUNTER — Encounter (HOSPITAL_COMMUNITY): Payer: Self-pay | Admitting: Anesthesiology

## 2013-01-11 ENCOUNTER — Ambulatory Visit (HOSPITAL_COMMUNITY): Payer: BC Managed Care – PPO | Admitting: Anesthesiology

## 2013-01-11 ENCOUNTER — Encounter (HOSPITAL_COMMUNITY)
Admission: RE | Disposition: A | Discharge status: Home / Self Care | Payer: Self-pay | Source: Ambulatory Visit | Attending: Ophthalmology

## 2013-01-11 DIAGNOSIS — H2510 Age-related nuclear cataract, unspecified eye: Secondary | ICD-10-CM | POA: Insufficient documentation

## 2013-01-11 HISTORY — PX: CATARACT EXTRACTION W/PHACO: SHX586

## 2013-01-11 SURGERY — PHACOEMULSIFICATION, CATARACT, WITH IOL INSERTION
Anesthesia: Monitor Anesthesia Care | Site: Eye | Laterality: Left | Wound class: Clean

## 2013-01-11 MED ORDER — BSS IO SOLN
INTRAOCULAR | Status: DC | PRN
  Administered 2013-01-11: 15 mL via INTRAOCULAR

## 2013-01-11 MED ORDER — MOXIFLOXACIN HCL 0.5 % OP SOLN - NO CHARGE
1.0000 [drp] | Freq: Once | OPHTHALMIC | Status: DC
  Filled 2013-01-11: qty 3

## 2013-01-11 MED ORDER — FENTANYL CITRATE 0.05 MG/ML IJ SOLN
INTRAMUSCULAR | Status: DC | PRN
  Administered 2013-01-11 (×3): 25 ug via INTRAVENOUS

## 2013-01-11 MED ORDER — FENTANYL CITRATE 0.05 MG/ML IJ SOLN
INTRAMUSCULAR | Status: AC
  Filled 2013-01-11: qty 2

## 2013-01-11 MED ORDER — MIDAZOLAM HCL 2 MG/2ML IJ SOLN
INTRAMUSCULAR | Status: AC
  Filled 2013-01-11: qty 2

## 2013-01-11 MED ORDER — LACTATED RINGERS IV SOLN
INTRAVENOUS | Status: DC
  Administered 2013-01-11: 07:00:00 via INTRAVENOUS

## 2013-01-11 MED ORDER — GATIFLOXACIN 0.5 % OP SOLN OPTIME - NO CHARGE
1.0000 [drp] | Freq: Once | OPHTHALMIC | Status: AC
  Administered 2013-01-11: 1 [drp] via OPHTHALMIC
  Filled 2013-01-11: qty 2.5

## 2013-01-11 MED ORDER — TETRACAINE 0.5 % OP SOLN OPTIME - NO CHARGE
OPHTHALMIC | Status: DC | PRN
  Administered 2013-01-11: 2 [drp] via OPHTHALMIC

## 2013-01-11 MED ORDER — EPINEPHRINE HCL 1 MG/ML IJ SOLN
INTRAOCULAR | Status: DC | PRN
  Administered 2013-01-11: 08:00:00

## 2013-01-11 MED ORDER — FENTANYL CITRATE 0.05 MG/ML IJ SOLN: 25.0000 ug | INTRAMUSCULAR | Status: DC | PRN

## 2013-01-11 MED ORDER — LIDOCAINE 3.5 % OP GEL OPTIME - NO CHARGE
OPHTHALMIC | Status: DC | PRN
  Administered 2013-01-11: 2 [drp] via OPHTHALMIC

## 2013-01-11 MED ORDER — NA HYALUR & NA CHOND-NA HYALUR 0.55-0.5 ML IO KIT
PACK | INTRAOCULAR | Status: DC | PRN
  Administered 2013-01-11: 1 via OPHTHALMIC

## 2013-01-11 MED ORDER — TETRACAINE HCL 0.5 % OP SOLN
OPHTHALMIC | Status: AC
  Filled 2013-01-11: qty 2

## 2013-01-11 MED ORDER — EPINEPHRINE HCL 1 MG/ML IJ SOLN
INTRAMUSCULAR | Status: AC
  Filled 2013-01-11: qty 1

## 2013-01-11 MED ORDER — ONDANSETRON HCL 4 MG/2ML IJ SOLN: 4.0000 mg | Freq: Once | INTRAMUSCULAR | Status: DC | PRN

## 2013-01-11 MED ORDER — MIDAZOLAM HCL 2 MG/2ML IJ SOLN
1.0000 mg | INTRAMUSCULAR | Status: DC | PRN
  Administered 2013-01-11 (×2): 2 mg via INTRAVENOUS

## 2013-01-11 MED ORDER — POVIDONE-IODINE 5 % OP SOLN
OPHTHALMIC | Status: DC | PRN
  Administered 2013-01-11: 1 via OPHTHALMIC

## 2013-01-11 MED ORDER — LIDOCAINE HCL 3.5 % OP GEL
OPHTHALMIC | Status: AC
  Filled 2013-01-11: qty 5

## 2013-01-11 MED ORDER — CYCLOPENTOLATE-PHENYLEPHRINE 0.2-1 % OP SOLN
1.0000 [drp] | Freq: Once | OPHTHALMIC | Status: AC
  Administered 2013-01-11: 1 [drp] via OPHTHALMIC

## 2013-01-11 MED ORDER — GATIFLOXACIN 0.5 % OP SOLN OPTIME - NO CHARGE
OPHTHALMIC | Status: DC | PRN
  Administered 2013-01-11: 1 [drp] via OPHTHALMIC

## 2013-01-11 MED ORDER — KETOROLAC TROMETHAMINE 0.4 % OP SOLN - NO CHARGE
1.0000 [drp] | Freq: Once | OPHTHALMIC | Status: AC
  Administered 2013-01-11: 1 [drp] via OPHTHALMIC
  Filled 2013-01-11: qty 5

## 2013-01-11 SURGICAL SUPPLY — 27 items
CAPSULAR TENSION RING-AMO (OPHTHALMIC RELATED) IMPLANT
CLOTH BEACON ORANGE TIMEOUT ST (SAFETY) ×1 IMPLANT
GLOVE BIO SURGEON STRL SZ7.5 (GLOVE) IMPLANT
GLOVE BIOGEL M 6.5 STRL (GLOVE) IMPLANT
GLOVE BIOGEL PI IND STRL 6.5 (GLOVE) IMPLANT
GLOVE BIOGEL PI IND STRL 7.0 (GLOVE) IMPLANT
GLOVE BIOGEL PI INDICATOR 6.5 (GLOVE)
GLOVE BIOGEL PI INDICATOR 7.0 (GLOVE) ×1
GLOVE ECLIPSE 6.5 STRL STRAW (GLOVE) IMPLANT
GLOVE ECLIPSE 7.5 STRL STRAW (GLOVE) IMPLANT
GLOVE EXAM NITRILE LRG STRL (GLOVE) IMPLANT
GLOVE EXAM NITRILE MD LF STRL (GLOVE) ×1 IMPLANT
GLOVE SKINSENSE NS SZ6.5 (GLOVE)
GLOVE SKINSENSE NS SZ7.0 (GLOVE)
GLOVE SKINSENSE STRL SZ6.5 (GLOVE) IMPLANT
GLOVE SKINSENSE STRL SZ7.0 (GLOVE) IMPLANT
INST SET CATARACT ~~LOC~~ (KITS) ×2 IMPLANT
KIT VITRECTOMY (OPHTHALMIC RELATED) IMPLANT
PAD ARMBOARD 7.5X6 YLW CONV (MISCELLANEOUS) ×1 IMPLANT
PROC W NO LENS (INTRAOCULAR LENS)
PROC W SPEC LENS (INTRAOCULAR LENS)
PROCESS W NO LENS (INTRAOCULAR LENS) IMPLANT
PROCESS W SPEC LENS (INTRAOCULAR LENS) IMPLANT
RING MALYGIN (MISCELLANEOUS) IMPLANT
SIGHTPATH CAT PROC W REG LENS (Ophthalmic Related) ×2 IMPLANT
VISCOELASTIC ADDITIONAL (OPHTHALMIC RELATED) IMPLANT
WATER STERILE IRR 250ML POUR (IV SOLUTION) ×1 IMPLANT

## 2013-01-11 NOTE — Brief Op Note (Signed)
01/11/2013  8:24 AM  PATIENT:  Joyce Gross  47 y.o. female  PRE-OPERATIVE DIAGNOSIS:  nuclear cataract left eye   POST-OPERATIVE DIAGNOSIS:  nuclear cataract left eye   PROCEDURE:  Procedure(s): CATARACT EXTRACTION PHACO AND INTRAOCULAR LENS PLACEMENT (IOC)  SURGEON:  Surgeon(s): Susa Simmonds, MD  ASSISTANTS:  Valda Lamb, CST  ANESTHESIA STAFF: Anesthesiologist: Laurene Footman, MD CRNA: Franco Nones, CRNA  ANESTHESIA:   topical and MAC  REQUESTED LENS POWER: 23.0  LENS IMPLANT INFORMATION: [employer] [id number]  S/n [id number]  Exp 07/2017  CUMULATIVE DISSIPATED ENERGY:0.00  INDICATIONS:pt states unable to read computer screen to do job requirements  OP FINDINGS:visually significant cortical spoking  COMPLICATIONS:None  DICTATION #: see scanned op note  PLAN OF CARE: KPE w IOL OS today  PATIENT DISPOSITION:  Short Stay

## 2013-01-11 NOTE — Anesthesia Procedure Notes (Signed)
Procedure Name: MAC Date/Time: 01/11/2013 7:38 AM Performed by: Franco Nones Pre-anesthesia Checklist: Patient identified, Emergency Drugs available, Suction available, Timeout performed and Patient being monitored Patient Re-evaluated:Patient Re-evaluated prior to inductionOxygen Delivery Method: Nasal Cannula

## 2013-01-11 NOTE — Op Note (Signed)
See scanned op note 

## 2013-01-11 NOTE — H&P (Signed)
I have reviewed the pre printed H&P, the patient was re-examined, and I have identified no significant interval changes in the patient's medical condition.  There is no change in the plan of care since the history and physical of record. 

## 2013-01-11 NOTE — Anesthesia Postprocedure Evaluation (Signed)
  Anesthesia Post-op Note  Patient: [name]  Procedure(s) Performed: Procedure(s) (LRB): CATARACT EXTRACTION PHACO AND INTRAOCULAR LENS PLACEMENT (IOC) (Left)  Patient Location:  Short Stay  Anesthesia Type: MAC  Level of Consciousness: awake  Airway and Oxygen Therapy: Patient Spontanous Breathing  Post-op Pain: none  Post-op Assessment: Post-op Vital signs reviewed, Patient's Cardiovascular Status Stable, Respiratory Function Stable, Patent Airway, No signs of Nausea or vomiting and Pain level controlled  Post-op Vital Signs: Reviewed and stable  Complications: No apparent anesthesia complications

## 2013-01-11 NOTE — Anesthesia Preprocedure Evaluation (Signed)
Anesthesia Evaluation  Patient identified by MRN, date of birth, ID band Patient awake    Reviewed: Allergy & Precautions, H&P , NPO status , Patient's Chart, lab work & pertinent test results  History of Anesthesia Complications Negative for: history of anesthetic complications  Airway Mallampati: I      Dental  (+) Teeth Intact   Pulmonary shortness of breath and with exertion, asthma ,  breath sounds clear to auscultation        Cardiovascular negative cardio ROS  Rhythm:Regular     Neuro/Psych  Headaches, PSYCHIATRIC DISORDERS Depression    GI/Hepatic   Endo/Other    Renal/GU      Musculoskeletal   Abdominal   Peds  Hematology   Anesthesia Other Findings   Reproductive/Obstetrics                           Anesthesia Physical Anesthesia Plan  ASA: II  Anesthesia Plan: MAC   Post-op Pain Management:    Induction: Intravenous  Airway Management Planned: Nasal Cannula  Additional Equipment:   Intra-op Plan:   Post-operative Plan:   Informed Consent: I have reviewed the patients History and Physical, chart, labs and discussed the procedure including the risks, benefits and alternatives for the proposed anesthesia with the patient or authorized representative who has indicated his/her understanding and acceptance.     Plan Discussed with:   Anesthesia Plan Comments:         Anesthesia Quick Evaluation

## 2013-01-11 NOTE — Transfer of Care (Signed)
Immediate Anesthesia Transfer of Care Note  Patient: [name]  Procedure(s) Performed: Procedure(s) (LRB): CATARACT EXTRACTION PHACO AND INTRAOCULAR LENS PLACEMENT (IOC) (Left)  Patient Location: Shortstay  Anesthesia Type: MAC  Level of Consciousness: awake  Airway & Oxygen Therapy: Patient Spontanous Breathing   Post-op Assessment: Report given to PACU RN, Post -op Vital signs reviewed and stable and Patient moving all extremities  Post vital signs: Reviewed and stable  Complications: No apparent anesthesia complications

## 2013-01-12 ENCOUNTER — Encounter (HOSPITAL_COMMUNITY): Payer: Self-pay | Admitting: Ophthalmology

## 2013-01-20 ENCOUNTER — Ambulatory Visit (HOSPITAL_COMMUNITY)
Admission: RE | Admit: 2013-01-20 | Discharge: 2013-01-20 | Disposition: A | Discharge status: Home / Self Care | Payer: BC Managed Care – PPO | Source: Ambulatory Visit | Attending: Cardiology | Admitting: Cardiology

## 2013-01-20 DIAGNOSIS — R079 Chest pain, unspecified: Secondary | ICD-10-CM | POA: Insufficient documentation

## 2013-01-20 DIAGNOSIS — R0602 Shortness of breath: Secondary | ICD-10-CM

## 2013-01-20 DIAGNOSIS — R0989 Other specified symptoms and signs involving the circulatory and respiratory systems: Secondary | ICD-10-CM | POA: Insufficient documentation

## 2013-01-20 DIAGNOSIS — R0609 Other forms of dyspnea: Secondary | ICD-10-CM | POA: Insufficient documentation

## 2013-01-20 NOTE — Progress Notes (Signed)
*  PRELIMINARY RESULTS* Echocardiogram Echocardiogram Stress Test has been performed.  [name] 01/20/2013, 11:15 AM

## 2013-01-20 NOTE — Progress Notes (Signed)
Stress Lab Nurses Notes - [facility]  LYNESSA ALMANZAR 01/20/2013 Reason for doing test: Chest Pain and Dyspnea Type of test: Stress Echo Nurse performing test: Parke Poisson, RN Nuclear Medicine Tech: Not Applicable Echo Tech: Karrie Doffing MD performing test: R. Rothbart Family MD: Juanetta Gosling Test explained and consent signed: yes IV started: No IV started Symptoms: SOB Treatment/Intervention: None Reason test stopped: reached target HR and SOB After recovery IV was: None Patient to return to [facility]. Med at : NA Patient discharged: Home Patient's Condition upon discharge was: stable Comments: Peak BP 160/80 & HR 164.  Recovery 122/70 & HR 108.  Symptoms resolved in recovery. Erskine Speed T

## 2013-01-22 NOTE — Progress Notes (Signed)
No. Echo is ok.

## 2013-03-17 ENCOUNTER — Other Ambulatory Visit (HOSPITAL_COMMUNITY): Payer: Self-pay | Admitting: Pulmonary Disease

## 2013-03-17 DIAGNOSIS — R110 Nausea: Secondary | ICD-10-CM

## 2013-03-18 ENCOUNTER — Telehealth: Payer: Self-pay | Admitting: Adult Health

## 2013-03-18 NOTE — Telephone Encounter (Signed)
She is having an abdomin US in am at [facility] for bloating and nausea  And wanted to know if a pelvic US would be a good idea, I told her Dr. Juanetta Gosling could order it, but I would need to see her to do that, If they can not do the [place] in am call me  And sit up appt.

## 2013-03-19 ENCOUNTER — Ambulatory Visit (HOSPITAL_COMMUNITY)
Admission: RE | Admit: 2013-03-19 | Discharge: 2013-03-19 | Disposition: A | Discharge status: Home / Self Care | Payer: BC Managed Care – PPO | Source: Ambulatory Visit | Attending: Pulmonary Disease | Admitting: Pulmonary Disease

## 2013-03-19 DIAGNOSIS — R110 Nausea: Secondary | ICD-10-CM | POA: Insufficient documentation

## 2013-03-19 DIAGNOSIS — R109 Unspecified abdominal pain: Secondary | ICD-10-CM | POA: Insufficient documentation

## 2013-03-23 ENCOUNTER — Other Ambulatory Visit (HOSPITAL_COMMUNITY): Payer: Self-pay | Admitting: Pulmonary Disease

## 2013-03-23 DIAGNOSIS — R110 Nausea: Secondary | ICD-10-CM

## 2013-03-23 DIAGNOSIS — R109 Unspecified abdominal pain: Secondary | ICD-10-CM

## 2013-03-25 ENCOUNTER — Encounter (HOSPITAL_COMMUNITY)
Admission: RE | Admit: 2013-03-25 | Discharge: 2013-03-25 | Disposition: A | Discharge status: Home / Self Care | Payer: BC Managed Care – PPO | Source: Ambulatory Visit | Attending: Pulmonary Disease | Admitting: Pulmonary Disease

## 2013-03-25 ENCOUNTER — Encounter (HOSPITAL_COMMUNITY): Payer: Self-pay

## 2013-03-25 DIAGNOSIS — R110 Nausea: Secondary | ICD-10-CM | POA: Insufficient documentation

## 2013-03-25 DIAGNOSIS — R109 Unspecified abdominal pain: Secondary | ICD-10-CM

## 2013-03-25 MED ORDER — TECHNETIUM TC 99M MEBROFENIN IV KIT
5.0000 | PACK | Freq: Once | INTRAVENOUS | Status: AC | PRN
  Administered 2013-03-25: 5 via INTRAVENOUS

## 2013-03-25 MED ORDER — SINCALIDE 5 MCG IJ SOLR
0.0200 ug/kg | Freq: Once | INTRAMUSCULAR | Status: AC
  Administered 2013-03-25: 1.5 ug via INTRAVENOUS

## 2013-03-29 ENCOUNTER — Telehealth: Payer: Self-pay | Admitting: Adult Health

## 2013-03-29 DIAGNOSIS — R112 Nausea with vomiting, unspecified: Secondary | ICD-10-CM

## 2013-03-29 NOTE — Telephone Encounter (Signed)
Had negative GB US, still has nausea and vomiting, will get pelvic US and visit to see me 5/6 at 3:30 pm.

## 2013-03-30 ENCOUNTER — Ambulatory Visit (INDEPENDENT_AMBULATORY_CARE_PROVIDER_SITE_OTHER): Payer: BC Managed Care – PPO

## 2013-03-30 ENCOUNTER — Encounter: Payer: Self-pay | Admitting: Adult Health

## 2013-03-30 ENCOUNTER — Ambulatory Visit (INDEPENDENT_AMBULATORY_CARE_PROVIDER_SITE_OTHER): Payer: BC Managed Care – PPO | Admitting: Adult Health

## 2013-03-30 VITALS — BP 120/72 | Ht 64.5 in | Wt 172.0 lb

## 2013-03-30 DIAGNOSIS — N949 Unspecified condition associated with female genital organs and menstrual cycle: Secondary | ICD-10-CM

## 2013-03-30 DIAGNOSIS — N83209 Unspecified ovarian cyst, unspecified side: Secondary | ICD-10-CM

## 2013-03-30 DIAGNOSIS — G43909 Migraine, unspecified, not intractable, without status migrainosus: Secondary | ICD-10-CM

## 2013-03-30 DIAGNOSIS — R109 Unspecified abdominal pain: Secondary | ICD-10-CM

## 2013-03-30 DIAGNOSIS — R112 Nausea with vomiting, unspecified: Secondary | ICD-10-CM

## 2013-03-30 HISTORY — DX: Migraine, unspecified, not intractable, without status migrainosus: G43.909

## 2013-03-30 MED ORDER — DEXLANSOPRAZOLE 60 MG PO CPDR: 60.0000 mg | DELAYED_RELEASE_CAPSULE | Freq: Every day | ORAL | Status: DC

## 2013-03-30 NOTE — Patient Instructions (Addendum)
Follow up labs by phone  Try dexilant

## 2013-03-30 NOTE — Progress Notes (Signed)
Subjective:     Patient ID: [name], female   DOB: Mar 14, 1966, 47 y.o.   MRN: [id number]  HPI [name] is a 47 year old white female complaining of nausea and vomiting, has had GB US that was normal. Has used phenergan without relief.  Review of Systems Patient denies any blurred vision, shortness of breath, chest pain, problems with bowel movements, urination, or intercourse. She has migraines regularly even with meds..she is having nausea and vomiting.  Reviewed past medical,surgical, social and family history. Reviewed medications and allergies.     Objective:   Physical Exam Blood pressure 120/72, height 5' 4.5" (1.638 m), weight 172 lb (78.019 kg).  Skin warm and dry. Abdomen: soft, tender mid epigastric and LLQ, no HSM noted   Pelvic US showed normal right ovary with 8mm follicular cyst, and left ovary has cyst present, no CDS fluid. Assessment:      Nausea and vomiting Abdominal pain Ovarian cyst     Plan:      Check CBC,CMP,TSH,FSH,lipase,amylase and H pylori Rx Dexilant 60 mg to take 1 daily, start today Follow up labs by phone in am Will refer to Dr. Karilyn Cota if symptoms persist after dexilant

## 2013-03-31 LAB — COMPREHENSIVE METABOLIC PANEL
AST: 23 U/L (ref 0–37)
Albumin: 3.8 g/dL (ref 3.5–5.2)
Alkaline Phosphatase: 55 U/L (ref 39–117)
Glucose, Bld: 93 mg/dL (ref 70–99)
Potassium: 4.2 mEq/L (ref 3.5–5.3)
Sodium: 141 mEq/L (ref 135–145)
Total Bilirubin: 0.4 mg/dL (ref 0.3–1.2)
Total Protein: 6.3 g/dL (ref 6.0–8.3)

## 2013-03-31 LAB — CBC
Hemoglobin: 14.4 g/dL (ref 12.0–15.0)
MCH: 29.2 pg (ref 26.0–34.0)
MCHC: 33.3 g/dL (ref 30.0–36.0)
Platelets: 362 10*3/uL (ref 150–400)
RDW: 13.9 % (ref 11.5–15.5)

## 2013-03-31 LAB — FOLLICLE STIMULATING HORMONE: FSH: 18.8 m[IU]/mL

## 2013-03-31 LAB — LIPASE: Lipase: 20 U/L (ref 0–75)

## 2013-04-01 ENCOUNTER — Telehealth: Payer: Self-pay | Admitting: *Deleted

## 2013-04-01 ENCOUNTER — Telehealth: Payer: Self-pay | Admitting: Adult Health

## 2013-04-01 NOTE — Telephone Encounter (Signed)
She head ? About PA and I told her it was sent yesterday.

## 2013-04-01 NOTE — Telephone Encounter (Signed)
Pt aware of labs, she has not gotten dexilant yet, please get it.

## 2013-04-01 NOTE — Telephone Encounter (Signed)
Routed to Jennifer. JSY 

## 2013-04-07 ENCOUNTER — Other Ambulatory Visit: Payer: Self-pay | Admitting: Adult Health

## 2013-04-08 ENCOUNTER — Telehealth: Payer: Self-pay | Admitting: Adult Health

## 2013-04-09 ENCOUNTER — Other Ambulatory Visit: Payer: Self-pay | Admitting: Adult Health

## 2013-04-12 NOTE — Telephone Encounter (Signed)
Insurance rejected  Dexilant, it helped but expensive, try prilosec.

## 2013-05-10 ENCOUNTER — Encounter (HOSPITAL_COMMUNITY): Payer: Self-pay | Admitting: Cardiology

## 2013-05-17 ENCOUNTER — Other Ambulatory Visit: Payer: Self-pay | Admitting: Adult Health

## 2013-06-17 ENCOUNTER — Ambulatory Visit (INDEPENDENT_AMBULATORY_CARE_PROVIDER_SITE_OTHER): Payer: BC Managed Care – PPO | Admitting: Orthopedic Surgery

## 2013-06-17 ENCOUNTER — Encounter: Payer: Self-pay | Admitting: Orthopedic Surgery

## 2013-06-17 ENCOUNTER — Ambulatory Visit (INDEPENDENT_AMBULATORY_CARE_PROVIDER_SITE_OTHER): Payer: BC Managed Care – PPO

## 2013-06-17 VITALS — BP 118/84 | Ht 64.0 in | Wt 174.0 lb

## 2013-06-17 DIAGNOSIS — M7540 Impingement syndrome of unspecified shoulder: Secondary | ICD-10-CM | POA: Insufficient documentation

## 2013-06-17 DIAGNOSIS — M7541 Impingement syndrome of right shoulder: Secondary | ICD-10-CM

## 2013-06-17 DIAGNOSIS — M25511 Pain in right shoulder: Secondary | ICD-10-CM

## 2013-06-17 DIAGNOSIS — M25519 Pain in unspecified shoulder: Secondary | ICD-10-CM | POA: Insufficient documentation

## 2013-06-17 DIAGNOSIS — M67919 Unspecified disorder of synovium and tendon, unspecified shoulder: Secondary | ICD-10-CM

## 2013-06-17 NOTE — Patient Instructions (Addendum)
You have received a steroid shot. 15% of patients experience increased pain at the injection site with in the next 24 hours. This is best treated with ice and tylenol extra strength 2 tabs every 8 hours. If you are still having pain please call the office.   Impingement Syndrome, Rotator Cuff, Bursitis with Rehab Impingement syndrome is a condition that involves inflammation of the tendons of the rotator cuff and the subacromial bursa, that causes pain in the shoulder. The rotator cuff consists of four tendons and muscles that control much of the shoulder and upper arm function. The subacromial bursa is a fluid filled sac that helps reduce friction between the rotator cuff and one of the bones of the shoulder (acromion). Impingement syndrome is usually an overuse injury that causes swelling of the bursa (bursitis), swelling of the tendon (tendonitis), and/or a tear of the tendon (strain). Strains are classified into three categories. Grade 1 strains cause pain, but the tendon is not lengthened. Grade 2 strains include a lengthened ligament, due to the ligament being stretched or partially ruptured. With grade 2 strains there is still function, although the function may be decreased. Grade 3 strains include a complete tear of the tendon or muscle, and function is usually impaired. SYMPTOMS   Pain around the shoulder, often at the outer portion of the upper arm.  Pain that gets worse with shoulder function, especially when reaching overhead or lifting.  Sometimes, aching when not using the arm.  Pain that wakes you up at night.  Sometimes, tenderness, swelling, warmth, or redness over the affected area.  Loss of strength.  Limited motion of the shoulder, especially reaching behind the back (to the back pocket or to unhook bra) or across your body.  Crackling sound (crepitation) when moving the arm.  Biceps tendon pain and inflammation (in the front of the shoulder). Worse when bending the elbow  or lifting. CAUSES  Impingement syndrome is often an overuse injury, in which chronic (repetitive) motions cause the tendons or bursa to become inflamed. A strain occurs when a force is paced on the tendon or muscle that is greater than it can withstand. Common mechanisms of injury include: Stress from sudden increase in duration, frequency, or intensity of training.  Direct hit (trauma) to the shoulder.  Aging, erosion of the tendon with normal use.  Bony bump on shoulder (acromial spur). RISK INCREASES WITH:  Contact sports (football, wrestling, boxing).  Throwing sports (baseball, tennis, volleyball).  Weightlifting and bodybuilding.  Heavy labor.  Previous injury to the rotator cuff, including impingement.  Poor shoulder strength and flexibility.  Failure to warm up properly before activity.  Inadequate protective equipment.  Old age.  Bony bump on shoulder (acromial spur). PREVENTION   Warm up and stretch properly before activity.  Allow for adequate recovery between workouts.  Maintain physical fitness:  Strength, flexibility, and endurance.  Cardiovascular fitness.  Learn and use proper exercise technique. PROGNOSIS  If treated properly, impingement syndrome usually goes away within 6 weeks. Sometimes surgery is required.  RELATED COMPLICATIONS   Longer healing time if not properly treated, or if not given enough time to heal.  Recurring symptoms, that result in a chronic condition.  Shoulder stiffness, frozen shoulder, or loss of motion.  Rotator cuff tendon tear.  Recurring symptoms, especially if activity is resumed too soon, with overuse, with a direct blow, or when using poor technique. TREATMENT  Treatment first involves the use of ice and medicine, to reduce pain and inflammation.   The use of strengthening and stretching exercises may help reduce pain with activity. These exercises may be performed at home or with a therapist. If non-surgical  treatment is unsuccessful after more than 6 months, surgery may be advised. After surgery and rehabilitation, activity is usually possible in 3 months.  MEDICATION  If pain medicine is needed, nonsteroidal anti-inflammatory medicines (aspirin and ibuprofen), or other minor pain relievers (acetaminophen), are often advised.  Do not take pain medicine for 7 days before surgery.  Prescription pain relievers may be given, if your caregiver thinks they are needed. Use only as directed and only as much as you need.  Corticosteroid injections may be given by your caregiver. These injections should be reserved for the most serious cases, because they may only be given a certain number of times. HEAT AND COLD  Cold treatment (icing) should be applied for 10 to 15 minutes every 2 to 3 hours for inflammation and pain, and immediately after activity that aggravates your symptoms. Use ice packs or an ice massage.  Heat treatment may be used before performing stretching and strengthening activities prescribed by your caregiver, physical therapist, or athletic trainer. Use a heat pack or a warm water soak. SEEK MEDICAL CARE IF:   Symptoms get worse or do not improve in 4 to 6 weeks, despite treatment.  New, unexplained symptoms develop. (Drugs used in treatment may produce side effects.) EXERCISES

## 2013-06-17 NOTE — Progress Notes (Signed)
Patient ID: [name], female   DOB: 09-21-1966, 47 y.o.   MRN: [id number] Chief Complaint  Patient presents with  . Shoulder Pain    Right shoulder pain, no injury.    Patient history right shoulder pain atraumatic onset 2 months. Sharp dull symptoms. Some stabbing pain radiating to the elbow. Comes and goes. Worse with forward elevation. Some tingling. No trauma. No previous shoulder surgery or injury.  Review of systems shortness of breath heartburn joint pain tingling depression adverse reactions to foods seasonal allergies  Allergic to penicillin, sulfa, nystatin and IV and Aminophyllin  History of asthma and depression  Status post bilateral total knees  Status post hysterectomy, C-section, lap Nissen, tubal cauterization, septoplasty, bilateral knee arthroscopy  Family history diabetes  Married nurse  Physical Exam(12)  Vital signs: BP 118/84  Ht 5\' 4"  (1.626 m)  Wt 174 lb (78.926 kg)  BMI 29.85 kg/m2   1.GENERAL: normal development   2. CDV: pulses are normal   3. Skin: normal  4. Lymph: nodes were not palpable/normal  5/6. Psychiatric: awake, alert and oriented, mood and affect normal   7. Neuro: normal sensation  8.   MSK  Gait: Normal 9.   Inspection left shoulder full range of motion no tenderness or swelling motor exam normal stability test reveals a stable shoulder, Inspection of the right shoulder reveals tenderness around the period chromic area. 10. Range of Motion flexion is limited to 120 pain active, passive 160 still painful 11. Motor rotator cuff tested 5 strength 12. Stability normal stability tests   Imaging normal shoulder x-ray  Assessment: Rotator cuff syndrome    Plan: Subacromial injection, home exercise program anti-inflammatories as needed  Subacromial Shoulder Injection Procedure Note  Pre-operative Diagnosis: left RC Syndrome  Post-operative Diagnosis: same  Indications: pain   Anesthesia: ethyl chloride    Procedure Details   Verbal consent was obtained for the procedure. The shoulder was prepped withalcohol and the skin was anesthetized. A 20 gauge needle was advanced into the subacromial space through posterior approach without difficulty  The space was then injected with 3 ml 1% lidocaine and 1 ml of depomedrol. The injection site was cleansed with isopropyl alcohol and a dressing was applied.  Complications:  None; patient tolerated the procedure well.

## 2013-07-20 ENCOUNTER — Other Ambulatory Visit (HOSPITAL_COMMUNITY): Payer: Self-pay | Admitting: Pulmonary Disease

## 2013-07-20 ENCOUNTER — Other Ambulatory Visit (HOSPITAL_COMMUNITY): Payer: BC Managed Care – PPO

## 2013-07-20 DIAGNOSIS — R0602 Shortness of breath: Secondary | ICD-10-CM

## 2013-07-22 ENCOUNTER — Ambulatory Visit (HOSPITAL_COMMUNITY)
Admission: RE | Admit: 2013-07-22 | Discharge: 2013-07-22 | Disposition: A | Discharge status: Home / Self Care | Payer: BC Managed Care – PPO | Source: Ambulatory Visit | Attending: Pulmonary Disease | Admitting: Pulmonary Disease

## 2013-07-22 DIAGNOSIS — R0609 Other forms of dyspnea: Secondary | ICD-10-CM | POA: Insufficient documentation

## 2013-07-22 DIAGNOSIS — R079 Chest pain, unspecified: Secondary | ICD-10-CM | POA: Insufficient documentation

## 2013-07-22 DIAGNOSIS — R0989 Other specified symptoms and signs involving the circulatory and respiratory systems: Secondary | ICD-10-CM | POA: Insufficient documentation

## 2013-07-22 DIAGNOSIS — I369 Nonrheumatic tricuspid valve disorder, unspecified: Secondary | ICD-10-CM

## 2013-07-22 DIAGNOSIS — R0602 Shortness of breath: Secondary | ICD-10-CM | POA: Insufficient documentation

## 2013-07-22 MED ORDER — IOHEXOL 300 MG/ML  SOLN
80.0000 mL | Freq: Once | INTRAMUSCULAR | Status: AC | PRN
  Administered 2013-07-22: 80 mL via INTRAVENOUS

## 2013-07-22 NOTE — Progress Notes (Signed)
*  PRELIMINARY RESULTS* Echocardiogram 2D Echocardiogram has been performed.  Conrad Waterview 07/22/2013, 1:21 PM

## 2013-07-30 ENCOUNTER — Telehealth: Payer: Self-pay | Admitting: *Deleted

## 2013-07-30 NOTE — Telephone Encounter (Signed)
Called.

## 2013-08-02 ENCOUNTER — Institutional Professional Consult (permissible substitution): Payer: BC Managed Care – PPO | Admitting: Internal Medicine

## 2013-08-02 ENCOUNTER — Ambulatory Visit (INDEPENDENT_AMBULATORY_CARE_PROVIDER_SITE_OTHER): Payer: BC Managed Care – PPO | Admitting: Internal Medicine

## 2013-08-02 ENCOUNTER — Encounter: Payer: Self-pay | Admitting: Internal Medicine

## 2013-08-02 VITALS — BP 128/84 | HR 107 | Temp 98.4°F | Ht 64.5 in | Wt 177.4 lb

## 2013-08-02 DIAGNOSIS — R0989 Other specified symptoms and signs involving the circulatory and respiratory systems: Secondary | ICD-10-CM

## 2013-08-02 DIAGNOSIS — R059 Cough, unspecified: Secondary | ICD-10-CM

## 2013-08-02 DIAGNOSIS — R0609 Other forms of dyspnea: Secondary | ICD-10-CM

## 2013-08-02 DIAGNOSIS — J45909 Unspecified asthma, uncomplicated: Secondary | ICD-10-CM

## 2013-08-02 DIAGNOSIS — R0600 Dyspnea, unspecified: Secondary | ICD-10-CM

## 2013-08-02 DIAGNOSIS — J479 Bronchiectasis, uncomplicated: Secondary | ICD-10-CM

## 2013-08-02 DIAGNOSIS — R05 Cough: Secondary | ICD-10-CM

## 2013-08-02 MED ORDER — TRAMADOL HCL 50 MG PO TABS: ORAL_TABLET | ORAL | Status: DC

## 2013-08-02 NOTE — Patient Instructions (Addendum)
Take prilosec 30- 60 min before your first and last meals of the day (or one dexilant 60 before bfast)  and pepcid ac 20 mg at bedtime  GERD (REFLUX)  is an extremely common cause of respiratory symptoms, many times with no significant heartburn at all.    It can be treated with medication, but also with lifestyle changes including avoidance of late meals, excessive alcohol, smoking cessation, and avoid fatty foods, chocolate, peppermint, colas, red wine, and acidic juices such as orange juice.  NO MINT OR MENTHOL PRODUCTS SO NO COUGH DROPS  USE SUGARLESS CANDY INSTEAD (jolley ranchers or Stover's)  NO OIL BASED VITAMINS - use powdered substitutes.   Take delsym two tsp every 12 hours and supplement if needed with  tramadol 50 mg up to 2 every 4 hours to suppress the urge to cough. Swallowing water or using ice chips/non mint and menthol containing candies (such as lifesavers or sugarless jolly ranchers) are also effective.  You should rest your voice and avoid activities that you know make you cough.  Once you have eliminated the cough for 3 straight days try reducing the tramadol first,  then the delsym as tolerated.   Please schedule a follow up office visit in 4 weeks, sooner if needed

## 2013-08-02 NOTE — Progress Notes (Signed)
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 Status >mech vent age 45 and some better but still requiring abx/ pred 3-4 times since then rx Hawkins/ Hicks dx dust/ mildew / animals on shots since around 2013 but referred by Dr Willa Rough 08/02/2013 to pulmonary clinic for refractory sob and abn ct chest c/w bronchiectasis.   08/02/2013 1st [facility] office visit/ [name] cc  2  mo  recurrrent resting paroxyms of sob somewhat different than before esp when speaking and no better with abx or prednisone. Assoc with  Unusual  Dry cough esp when lie down, no better with albuterol but maintaining on qvar and dulera. Had been on dexilant until 2-3 m  prior to OV (for atypical cp which was better since feb 2014)  Sp NF > 10 y  prior to OV  -  C/o Doe x 100 ft - on ppi with bfast and hs.  Not aware of a problem while sleeping or in am's just immediately on lying down  No obvious day to day or daytime variabilty or assoc excess mucus or cp or chest tightness, subjective wheeze overt sinus or hb symptoms. No unusual exp hx or h/o childhood pna/ asthma or knowledge of premature birth.  Sleeping ok p gets to sleep without nocturnal  or early am exacerbation  of respiratory  c/o's or need for noct saba. Also denies any obvious fluctuation of symptoms with weather or environmental changes or other aggravating or alleviating factors except as outlined above   Current Medications, Allergies, Complete Past Medical History, Past Surgical History, Family History, and Social History were reviewed in [name] record.    Review of Systems  Constitutional: Negative for fever, chills and unexpected weight change.  HENT: Positive for congestion and postnasal drip. Negative for ear  pain, nosebleeds, sore throat, rhinorrhea, sneezing, trouble swallowing, dental problem, voice change and sinus pressure.   Eyes: Negative for visual disturbance.  Respiratory: Positive for cough and shortness of breath. Negative for choking.   Cardiovascular: Positive for leg swelling. Negative for chest pain.  Gastrointestinal: Negative for vomiting, abdominal pain and diarrhea.  Genitourinary: Negative for difficulty urinating.  Musculoskeletal: Positive for arthralgias.  Skin: Negative for rash.  Neurological: Positive for headaches. Negative for tremors and syncope.  Hematological: Does not bruise/bleed easily.       Objective:   Physical Exam  Mod anxious very pleasant amb wf nad with prominent pseudowheeze  Wt Readings from Last 3 Encounters:  08/02/13 177 lb 6.4 oz (80.468 kg)  06/17/13 174 lb (78.926 kg)  03/30/13 172 lb (78.019 kg)     HEENT: nl dentition, turbinates, and orophanx. Nl external ear canals without cough reflex   NECK :  without JVD/Nodes/TM/ nl carotid upstrokes bilaterally   LUNGS: no acc muscle use, clear to A and P bilaterally without cough on insp or exp maneuvers   CV:  RRR  no s3 or murmur or increase in P2, no edema   ABD:  soft and nontender with nl excursion in the supine position. No bruits or organomegaly, bowel sounds nl  MS:  warm without deformities, calf tenderness, cyanosis or clubbing  SKIN: warm and dry without lesions    NEURO:  alert, approp, no deficits   CT chest  07/22/13  1. No acute cardiopulmonary abnormalities.  2. Bilateral areas of parenchymal scarring and mild bronchiectasis  identified. Findings are likely the sequelae of chronic  inflammation or infection.       Assessment & Plan:

## 2013-08-03 ENCOUNTER — Other Ambulatory Visit: Payer: Self-pay | Admitting: Adult Health

## 2013-08-03 ENCOUNTER — Other Ambulatory Visit: Payer: Self-pay | Admitting: Obstetrics and Gynecology

## 2013-08-03 DIAGNOSIS — N649 Disorder of breast, unspecified: Secondary | ICD-10-CM

## 2013-08-03 DIAGNOSIS — R059 Cough, unspecified: Secondary | ICD-10-CM | POA: Insufficient documentation

## 2013-08-03 DIAGNOSIS — R0600 Dyspnea, unspecified: Secondary | ICD-10-CM | POA: Insufficient documentation

## 2013-08-03 DIAGNOSIS — R05 Cough: Secondary | ICD-10-CM | POA: Insufficient documentation

## 2013-08-03 DIAGNOSIS — J479 Bronchiectasis, uncomplicated: Secondary | ICD-10-CM | POA: Insufficient documentation

## 2013-08-03 NOTE — Assessment & Plan Note (Signed)
-   See CT chest 07/22/13 likely related to remote lung injury   No symptoms attributable to bronchiectasis  Needs alpha one screening to be complete if not already done

## 2013-08-03 NOTE — Assessment & Plan Note (Signed)
-   08/03/2013  Walked RA x 3 laps @ 185 ft each stopped due to  End of study, no desat  Not reproducible here.

## 2013-08-03 NOTE — Assessment & Plan Note (Signed)
DDX of  difficult airways managment all start with A and  include Adherence, Ace Inhibitors, Acid Reflux, Active Sinus Disease, Alpha 1 Antitripsin deficiency, Anxiety masquerading as Airways dz,  ABPA,  allergy(esp in young), Aspiration (esp in elderly), Adverse effects of DPI,  Active smokers, plus two Bs  = Bronchiectasis and Beta blocker use..and one C= CHF  Adherence is always the initial "prime suspect" and is a multilayered concern that requires a "trust but verify" approach in every patient - starting with knowing how to use medications, especially inhalers, correctly, keeping up with refills and understanding the fundamental difference between maintenance and prns vs those medications only taken for a very short course and then stopped and not refilled.   ? Acid or non acid reflux despite nf > 10 y ago,  Possibly from coughing in a cyclical pattern > max rx reviewed  ? Bronchiectasis (see sep discussion)  ? Anxiety > dx of exclusion but note symptoms at rest that are not reproducible with exertion or respond to saba

## 2013-08-03 NOTE — Assessment & Plan Note (Addendum)
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   These are the same pts (now being labeled as having "irritable larynx syndrome" by some cough centers) who not infrequently have a history of having failed to tolerate ace inhibitors,  dry powder inhalers or biphosphonates or report having atypical reflux symptoms that don't respond to standard doses of PPI , and are easily confused as having aecopd or asthma flares by even experienced allergists/ pulmonologists.   rec max rx of gerd/cyclical cough  then regroup   See instructions for specific recommendations which were reviewed directly with the patient who was given a copy with highlighter outlining the key components.

## 2013-08-05 ENCOUNTER — Ambulatory Visit: Payer: BC Managed Care – PPO | Admitting: Orthopedic Surgery

## 2013-08-10 ENCOUNTER — Institutional Professional Consult (permissible substitution): Payer: BC Managed Care – PPO | Admitting: Internal Medicine

## 2013-08-16 ENCOUNTER — Ambulatory Visit
Admission: RE | Admit: 2013-08-16 | Discharge: 2013-08-16 | Disposition: A | Discharge status: Home / Self Care | Payer: BC Managed Care – PPO | Source: Ambulatory Visit | Attending: Obstetrics and Gynecology | Admitting: Obstetrics and Gynecology

## 2013-08-16 ENCOUNTER — Ambulatory Visit (INDEPENDENT_AMBULATORY_CARE_PROVIDER_SITE_OTHER): Payer: BC Managed Care – PPO | Admitting: Internal Medicine

## 2013-08-16 ENCOUNTER — Encounter: Payer: Self-pay | Admitting: Obstetrics and Gynecology

## 2013-08-16 DIAGNOSIS — N649 Disorder of breast, unspecified: Secondary | ICD-10-CM

## 2013-08-23 ENCOUNTER — Encounter: Payer: Self-pay | Admitting: Vascular Surgery

## 2013-08-24 ENCOUNTER — Ambulatory Visit (HOSPITAL_COMMUNITY)
Admission: RE | Admit: 2013-08-24 | Discharge: 2013-08-24 | Disposition: A | Discharge status: Home / Self Care | Payer: BC Managed Care – PPO | Source: Ambulatory Visit | Attending: Vascular Surgery | Admitting: Vascular Surgery

## 2013-08-24 ENCOUNTER — Ambulatory Visit (INDEPENDENT_AMBULATORY_CARE_PROVIDER_SITE_OTHER): Payer: BC Managed Care – PPO | Admitting: Vascular Surgery

## 2013-08-24 ENCOUNTER — Encounter: Payer: Self-pay | Admitting: Vascular Surgery

## 2013-08-24 VITALS — BP 131/84 | HR 89 | Resp 18 | Ht 64.0 in | Wt 171.3 lb

## 2013-08-24 DIAGNOSIS — I83893 Varicose veins of bilateral lower extremities with other complications: Secondary | ICD-10-CM

## 2013-08-24 DIAGNOSIS — M79609 Pain in unspecified limb: Secondary | ICD-10-CM

## 2013-08-24 NOTE — Progress Notes (Signed)
Patient presents today for evaluation of right leg discomfort. She is well known to me for treatment with left leg venous varicosities several years ago. Procedure was left great saphenous vein ablation and multiple stab phlebectomy is in the left thigh. This was in January 2013. She continues to have good relief of the left leg. She now complains of right leg symptoms. This is mainly over the right lateral thigh where she has enlarged reticular veins. She reports this is a burning and aching sensation in the is present constantly but is worse with prolonged standing. She has no history of DVT   Past Medical History  Diagnosis Date  . Asthma     dxed at age 47  . Allergy   . Varicose veins   . Cataracts, bilateral   . Shortness of breath     SOB with exertion  . Depression   . Restless leg syndrome   . Migraine     last migraine:01/06/13  . Migraines 03/30/2013  . GERD (gastroesophageal reflux disease)     History  Substance Use Topics  . Smoking status: Former Smoker -- 0.25 packs/day for 2 years    Types: Cigarettes    Quit date: 07/23/1981  . Smokeless tobacco: Never Used  . Alcohol Use: Yes     Comment: occassional glass of wine or beer    Family History  Problem Relation Age of Onset  . Diabetes Mother   . Depression Mother   . Diabetes type II Mother   . Migraines Sister   . Other Sister     migranes  . Depression Sister   . Fibromyalgia Sister   . Hypertension Sister   . Hypertension Father   . Heart failure Father   . Asthma Maternal Grandmother   . Asthma Child     Allergies  Allergen Reactions  . Hydrocodone Itching    Itching,   . Aminophylline Rash  . Nystatin Rash  . Penicillins Rash  . Sulfamethoxazole-Trimethoprim Rash    Pt has taken before but DS causes rash. Single strength does not    Current outpatient prescriptions:acetaminophen (TYLENOL) 500 MG tablet, Take 1,000 mg by mouth every 6 (six) hours as needed. For pain, Disp: , Rfl: ;  albuterol  (VENTOLIN HFA) 108 (90 BASE) MCG/ACT inhaler, Inhale 2 puffs into the lungs every 6 (six) hours as needed. For shortness of breath, Disp: , Rfl: ;  amphetamine-dextroamphetamine (ADDERALL) 20 MG tablet, Take 20 mg by mouth 2 (two) times daily., Disp: , Rfl:  azelastine (ASTELIN) 137 MCG/SPRAY nasal spray, 1 spray 2 (two) times daily. , Disp: , Rfl: ;  beclomethasone (QVAR) 80 MCG/ACT inhaler, Inhale 2 puffs into the lungs 2 (two) times daily., Disp: , Rfl: ;  buPROPion (WELLBUTRIN SR) 150 MG 12 hr tablet, Take 150 mg by mouth 2 (two) times daily., Disp: , Rfl: ;  famotidine (PEPCID) 20 MG tablet, Take 20 mg by mouth at bedtime as needed for heartburn., Disp: , Rfl:  fluconazole (DIFLUCAN) 150 MG tablet, TAKE ONE TABLET BY MOUTH NOW AND ONE AS NEEDED, Disp: 2 tablet, Rfl: 0;  fluticasone (FLONASE) 50 MCG/ACT nasal spray, Place 1 spray into the nose 2 (two) times daily., Disp: , Rfl: ;  mometasone-formoterol (DULERA) 100-5 MCG/ACT AERO, Inhale 2 puffs into the lungs 2 (two) times daily., Disp: , Rfl: ;  montelukast (SINGULAIR) 10 MG tablet, Take 10 mg by mouth at bedtime., Disp: , Rfl:  OMEPRAZOLE PO, Take by mouth 2 (two) times daily.,  Disp: , Rfl: ;  oxyCODONE-acetaminophen (PERCOCET) 5-325 MG per tablet, Take 1 tablet by mouth every 4 (four) hours as needed. For migraine, Disp: , Rfl: ;  pramipexole (MIRAPEX) 0.25 MG tablet, Take 0.5 mg by mouth every evening. , Disp: , Rfl: ;  rizatriptan (MAXALT) 10 MG tablet, Take 10 mg by mouth as needed. May repeat in 2 hours if needed , Disp: , Rfl:  topiramate (TOPAMAX) 200 MG tablet, Take 200 mg by mouth 2 (two) times daily.  , Disp: , Rfl: ;  traMADol (ULTRAM) 50 MG tablet, 1-2 every 4 hours as needed for cough or pain, Disp: 40 tablet, Rfl: 0;  Olopatadine HCl (PATANASE) 0.6 % SOLN, Place 1 puff into the nose at bedtime., Disp: , Rfl:   BP 131/84  Pulse 89  Resp 18  Ht 5\' 4"  (1.626 m)  Wt 171 lb 4.8 oz (77.701 kg)  BMI 29.39 kg/m2  SpO2 100%  Body mass index  is 29.39 kg/(m^2).       Physical exam well-developed well-nourished white female no acute distress. Her lower Shoney's are noted for 2+ dorsalis pedis pulses bilaterally. His anterior incisions from knee replacement bilaterally. Left leg has good result with no recurrent varicosities. On the right leg she does have reticular veins over the area of the discomfort over the lateral thigh extending down to the knee area. She does have multiple telangiectasia as well  Venous duplex today the right leg reveals reflux in her mid great saphenous vein. On imaging this with SonoSite she does have extension of these reticular is a from the saphenous vein post generator thigh to her lateral thigh. 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 We have explained the importance of compression due to the distention of these veins. She will begin 20-30 mm graduated compression stockings and elevation and ibuprofen ibuprofen. We will see her again in 3 months to determine if conservative treatment is working. She would be a candidate for ablation of her great saphenous vein and sclerotherapy of the painful reticular should she fail conservative treatment

## 2013-08-25 ENCOUNTER — Ambulatory Visit (HOSPITAL_COMMUNITY)
Admission: RE | Admit: 2013-08-25 | Discharge: 2013-08-25 | Disposition: A | Discharge status: Home / Self Care | Payer: BC Managed Care – PPO | Source: Ambulatory Visit | Attending: Vascular Surgery | Admitting: Vascular Surgery

## 2013-08-25 ENCOUNTER — Other Ambulatory Visit: Payer: Self-pay | Admitting: Vascular Surgery

## 2013-08-25 DIAGNOSIS — I83893 Varicose veins of bilateral lower extremities with other complications: Secondary | ICD-10-CM

## 2013-08-30 ENCOUNTER — Ambulatory Visit (INDEPENDENT_AMBULATORY_CARE_PROVIDER_SITE_OTHER): Payer: BC Managed Care – PPO | Admitting: Internal Medicine

## 2013-08-30 ENCOUNTER — Encounter: Payer: Self-pay | Admitting: Internal Medicine

## 2013-08-30 VITALS — BP 104/70 | HR 94 | Temp 98.7°F | Ht 64.0 in | Wt 169.4 lb

## 2013-08-30 DIAGNOSIS — J45909 Unspecified asthma, uncomplicated: Secondary | ICD-10-CM

## 2013-08-30 DIAGNOSIS — J479 Bronchiectasis, uncomplicated: Secondary | ICD-10-CM

## 2013-08-30 DIAGNOSIS — R05 Cough: Secondary | ICD-10-CM

## 2013-08-30 DIAGNOSIS — R059 Cough, unspecified: Secondary | ICD-10-CM

## 2013-08-30 MED ORDER — PANTOPRAZOLE SODIUM 40 MG PO TBEC: 40.0000 mg | DELAYED_RELEASE_TABLET | Freq: Every day | ORAL | Status: DC

## 2013-08-30 MED ORDER — TRAMADOL HCL 50 MG PO TABS: ORAL_TABLET | ORAL | Status: DC

## 2013-08-30 NOTE — Patient Instructions (Addendum)
Try off dulera - if symptoms worsen resume dulera immediately   Work on perfecting  inhaler technique:  relax and gently blow all the way out then take a nice smooth deep breath back in, triggering the inhaler at same time you start breathing in.  Hold for up to 5 seconds if you can.  Rinse and gargle with water when done.  Ok to use delsym as much as you want but if you need tramadol regularly we will need to see you back here for further evaluation as the delsym should be adequate  Late Add GI Rehman

## 2013-08-30 NOTE — Progress Notes (Signed)
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 Status >mech vent age 32 and some better but still requiring abx/ pred 3-4 times per year since then rx Hawkins/ Hicks dx dust/ mildew / animals on shots since around 2013 but referred by [name] 08/02/2013 to pulmonary clinic for refractory sob and abn ct chest c/w bronchiectasis.   08/02/2013 1st [facility] office visit/ [name] cc  2  mo  recurrrent resting paroxyms of sob somewhat different than before esp when speaking and no better with abx or prednisone. Assoc with  Unusual  Dry cough esp when lie down, no better with albuterol but maintaining on qvar and dulera. Had been on dexilant until 2-3 m  prior to OV (for atypical cp which was better since feb 2014)  Sp NF > 10 y  prior to OV  -  C/o Doe x 100 ft - on ppi with bfast and hs.  Not aware of a problem while sleeping or in am's just immediately on lying down rec Take prilosec 30- 60 min before your first and last meals of the day (or one dexilant 60 before bfast)  and pepcid ac 20 mg at bedtime GERD  .  Take delsym two tsp every 12 hours and supplement if needed with  tramadol 50 mg up to 2 every 4 hours to suppress the urge to cough. Once you have eliminated the cough for 3 straight days try reducing the tramadol first    08/30/2013 f/u ov/[name] re: cough ? Asthma vs uacs Chief Complaint  Patient presents with  . Follow-up    [name] states that her breathing is much improved since the last visit. Her cough had resolved, but had to recently start back on delsym and tramadol since weather change.  on dulera and qvar not limited by sob and no saba need at all  No obvious day to day or daytime variabilty or assoc excess mucus or cp or chest tightness, subjective wheeze overt  sinus or hb symptoms. No unusual exp hx or h/o childhood pna/ asthma or knowledge of premature birth.  Sleeping ok without nocturnal  or early am exacerbation  of respiratory  c/o's or need for noct saba. Also denies any obvious fluctuation of symptoms with weather or environmental changes or other aggravating or alleviating factors except as outlined above   Current Medications, Allergies, Complete Past Medical History, Past Surgical History, Family History, and Social History were reviewed in [name] record.  ROS  The following are not active complaints unless bolded sore throat, dysphagia, dental problems, itching, sneezing,  nasal congestion or excess/ purulent secretions, ear ache,   fever, chills, sweats, unintended wt loss, pleuritic or exertional cp, hemoptysis,  orthopnea pnd or leg swelling, presyncope, palpitations, heartburn, abdominal pain, anorexia, nausea, vomiting, diarrhea  or change in bowel or urinary habits, change in stools or urine, dysuria,hematuria,  rash, arthralgias, visual complaints, headache, numbness weakness or ataxia or problems with walking or coordination,  change in mood/affect or memory.             Objective:   Physical Exam  Mod anxious very pleasant amb wf nad    08/30/2013       169  Wt Readings from Last 3 Encounters:  08/02/13 177 lb 6.4 oz (80.468 kg)  06/17/13 174 lb (78.926 kg)  03/30/13 172 lb (78.019 kg)     HEENT: nl dentition, turbinates, and orophanx. Nl external ear canals without cough reflex   NECK :  without JVD/Nodes/TM/ nl carotid upstrokes bilaterally   LUNGS: no acc muscle use, clear to A and P bilaterally without cough on insp or exp maneuvers   CV:  RRR  no s3 or murmur or increase in P2, no edema   ABD:  soft and nontender with nl excursion in the supine position. No bruits or organomegaly, bowel sounds nl  MS:  warm without deformities, calf tenderness, cyanosis or clubbing  SKIN: warm and  dry without lesions    NEURO:  alert, approp, no deficits   CT chest  07/22/13  1. No acute cardiopulmonary abnormalities.  2. Bilateral areas of parenchymal scarring and mild bronchiectasis  identified. Findings are likely the sequelae of chronic  inflammation or infection.       Assessment & Plan:

## 2013-08-31 NOTE — Assessment & Plan Note (Signed)
The proper method of use, as well as anticipated side effects, of a metered-dose inhaler are discussed and demonstrated to the patient. Improved effectiveness after extensive coaching during this visit to a level of approximately  90%  All goals of chronic asthma control met including optimal function and elimination of symptoms with minimal need for rescue therapy.  Contingencies discussed in full including contacting this office immediately if not controlling the symptoms using the rule of two's.     Ok to change dulera to just prn use and try maint rx with qvar since less likely to contribute to cough

## 2013-08-31 NOTE — Assessment & Plan Note (Addendum)
Clearly improved on max gerd rx despite remote NF  typical of  Classic Upper airway cough syndrome, so named because it's frequently impossible to sort out how much is  CR/sinusitis with freq throat clearing (which can be related to primary GERD)   vs  causing  secondary (" extra esophageal")  GERD from wide swings in gastric pressure that occur with throat clearing, often  promoting self use of mint and menthol lozenges that reduce the lower esophageal sphincter tone and exacerbate the problem further in a cyclical fashion.   These are the same pts (now being labeled as having "irritable larynx syndrome" by some cough centers) who not infrequently have a history of having failed to tolerate ace inhibitors,  dry powder inhalers or biphosphonates or report having atypical reflux symptoms that don't respond to standard doses of PPI , and are easily confused as having aecopd or asthma flares by even experienced allergists/ pulmonologists.   If flares each time she backs off gerd rx may need to consider re-eval by GI

## 2013-08-31 NOTE — Assessment & Plan Note (Signed)
-   See CT chest 07/22/13 likely related to remote lung injury   Cough not typical of bronchiectasis > no need for specific rx

## 2013-09-30 ENCOUNTER — Ambulatory Visit (INDEPENDENT_AMBULATORY_CARE_PROVIDER_SITE_OTHER): Payer: BC Managed Care – PPO

## 2013-09-30 ENCOUNTER — Ambulatory Visit (INDEPENDENT_AMBULATORY_CARE_PROVIDER_SITE_OTHER): Payer: BC Managed Care – PPO | Admitting: Orthopedic Surgery

## 2013-09-30 ENCOUNTER — Encounter: Payer: Self-pay | Admitting: Orthopedic Surgery

## 2013-09-30 VITALS — BP 113/78 | Ht 64.0 in | Wt 169.0 lb

## 2013-09-30 DIAGNOSIS — M7711 Lateral epicondylitis, right elbow: Secondary | ICD-10-CM

## 2013-09-30 DIAGNOSIS — M25521 Pain in right elbow: Secondary | ICD-10-CM

## 2013-09-30 DIAGNOSIS — M25529 Pain in unspecified elbow: Secondary | ICD-10-CM

## 2013-09-30 DIAGNOSIS — M7710 Lateral epicondylitis, unspecified elbow: Secondary | ICD-10-CM | POA: Insufficient documentation

## 2013-09-30 NOTE — Progress Notes (Signed)
Patient ID: [name], female   DOB: Apr 20, 1966, 47 y.o.   MRN: [id number]   Right elbow pain over the lateral epicondyle x6 weeks  Patient denies trauma. She reports a minutes it is sharp stabbing lateral elbow pain which is worse with lifting relieved by ibuprofen 1600 mg.  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 CDV: pulses are normal   3. Skin: normal  4. Lymph: nodes were not palpable/normal  5/6. Psychiatric: awake, alert and oriented, mood and affect normal   7. Neuro: normal sensation  8.   MSK  Gait: Ambulation is normal 9.   Inspection of the right elbow reveals tenderness in the lateral upper condyle 10. Range of Motion painful extension but full range of motion 11. Motor normal flexion-extension strength 12. Stability normal medial lateral stability   Imaging normal x-ray  Assessment:  Encounter Diagnoses  Name Primary?  . Elbow pain, right Yes  . Tennis elbow syndrome, right        Plan: Recommend injection, brace, tennis elbow exercises Right Lateral right epicondyle injection. (right)  Consent.  Timeout to confirm site.  Right elbow was injected with Depo-Medrol 40 mg and lidocaine 1% 3 cc with sterile technique using alcohol and ethyl chloride prep.  There were no complications

## 2013-09-30 NOTE — Patient Instructions (Addendum)
You have received a steroid shot. 15% of patients experience increased pain at the injection site with in the next 24 hours. This is best treated with ice and tylenol extra strength 2 tabs every 8 hours. If you are still having pain please call the office.      Tennis Elbow Tennis elbow can happen in any sport or job where you overuse your elbow. It is caused by doing the same motion over and over. This makes small tears in the forearm muscles. It can also cause muscle redness, soreness, and puffiness (inflammation). HOME CARE  Rest.  Use your other hand or arm that is not affected. Change your grip.  Only take medicine as told by your doctor.  Put ice on the area after activity.  Put ice in a plastic bag.  Place a towel between your skin and the bag.  Leave the ice on for 15-20 minutes, 03-04 times a day.  Wear a splint or sling as told by your doctor. This lets the area rest and heal faster. GET HELP RIGHT AWAY IF: Your pain gets worse or does not improve in 2 weeks even with treatment. MAKE SURE YOU:   Understand these instructions.  Will watch your condition.  Will get help right away if you are not doing well or get worse. Document Released: 05/01/2010 Document Revised: 02/03/2012 Document Reviewed: 05/01/2010 [facility] Patient Information 2014 [place], [place].

## 2013-10-01 ENCOUNTER — Telehealth: Payer: Self-pay | Admitting: Adult Health

## 2013-10-01 MED ORDER — FLUCONAZOLE 150 MG PO TABS: ORAL_TABLET | ORAL | Status: DC

## 2013-10-01 NOTE — Telephone Encounter (Signed)
diflucan sent to [facility]

## 2013-10-01 NOTE — Telephone Encounter (Signed)
Spoke with pt. She usually gets a yeast infection after antibiotics. Has been on antibiotics from her dentist. Can you order Diflucan #2 for pt? Uses [facility] in [place]. Has an appt scheduled in December for pap. Thanks!!!

## 2013-10-05 ENCOUNTER — Ambulatory Visit: Payer: BC Managed Care – PPO | Admitting: Orthopedic Surgery

## 2013-10-06 ENCOUNTER — Telehealth: Payer: Self-pay | Admitting: Internal Medicine

## 2013-10-06 MED ORDER — PANTOPRAZOLE SODIUM 40 MG PO TBEC: 40.0000 mg | DELAYED_RELEASE_TABLET | Freq: Every day | ORAL | Status: DC

## 2013-10-06 NOTE — Telephone Encounter (Signed)
I spoke with pt and aware rx has been sent. Nothing further needed

## 2013-10-06 NOTE — Telephone Encounter (Signed)
lmomtcb x1 

## 2013-10-28 ENCOUNTER — Telehealth: Payer: Self-pay | Admitting: *Deleted

## 2013-10-28 NOTE — Telephone Encounter (Signed)
Has ? Rectocele having hard time with BM try glycerine supp, can come in tomorrow if desires but she has to work

## 2013-10-29 ENCOUNTER — Encounter: Payer: Self-pay | Admitting: Adult Health

## 2013-10-29 ENCOUNTER — Ambulatory Visit (INDEPENDENT_AMBULATORY_CARE_PROVIDER_SITE_OTHER): Payer: BC Managed Care – PPO | Admitting: Adult Health

## 2013-10-29 VITALS — BP 118/78 | Ht 64.5 in | Wt 167.0 lb

## 2013-10-29 DIAGNOSIS — N8111 Cystocele, midline: Secondary | ICD-10-CM

## 2013-10-29 DIAGNOSIS — K5900 Constipation, unspecified: Secondary | ICD-10-CM | POA: Insufficient documentation

## 2013-10-29 DIAGNOSIS — IMO0002 Reserved for concepts with insufficient information to code with codable children: Secondary | ICD-10-CM

## 2013-10-29 DIAGNOSIS — N816 Rectocele: Secondary | ICD-10-CM

## 2013-10-29 HISTORY — DX: Reserved for concepts with insufficient information to code with codable children: IMO0002

## 2013-10-29 HISTORY — DX: Rectocele: N81.6

## 2013-10-29 HISTORY — DX: Constipation, unspecified: K59.00

## 2013-10-29 NOTE — Patient Instructions (Signed)
Review medical explainer #4  Call prn if wants surgery for rectocele Take mira lax and use glycerine supp

## 2013-10-29 NOTE — Progress Notes (Signed)
Subjective:     Patient ID: [name], female   DOB: February 05, 1966, 47 y.o.   MRN: [id number]  HPI [name] is a 47 year old white female married, in complaining of not having BMs easily and bulge in  Vagina.I spoke with her by phone last night and told her to use glycerine supp and put 2 fingers in vagina to push back and she did have BM.She has some SUI and low back pain.She says she alternates between constipation and diarrhea.She is taking mira lax now.Using luvena for vaginal dryness.She has asthma and has had flare recently.  Review of Systems See HPI Reviewed past medical,surgical, social and family history. Reviewed medications and allergies.     Objective:   Physical Exam BP 118/78  Ht 5' 4.5" (1.638 m)  Wt 167 lb (75.751 kg)  BMI 28.23 kg/m2   Skin warm and dry.Pelvic: external genitalia is normal in appearance, vagina: has mild cystocele and loss of some rugae, pale pink, cervix and uterus are absent, adnexa: no masses or tenderness noted.on rectal exam has moderate to severe rectocele, no hemorrhoids noted.Discussed she could watch for now but surgery would fix rectocele, could try pessary for cystocele Assessment:     Rectocele Cystocele Constipation     Plan:    Continue mira lax use glycerine supp Review handout medical explainer #4 of cystocele and rectocele Follow up prn if desires surgery

## 2013-11-01 ENCOUNTER — Ambulatory Visit: Payer: BC Managed Care – PPO | Admitting: Adult Health

## 2013-11-03 ENCOUNTER — Ambulatory Visit: Payer: BC Managed Care – PPO | Admitting: Advanced Practice Midwife

## 2013-11-11 ENCOUNTER — Ambulatory Visit (INDEPENDENT_AMBULATORY_CARE_PROVIDER_SITE_OTHER): Payer: BC Managed Care – PPO | Admitting: Adult Health

## 2013-11-11 ENCOUNTER — Encounter: Payer: Self-pay | Admitting: Adult Health

## 2013-11-11 VITALS — BP 110/80 | Ht 64.5 in | Wt 169.0 lb

## 2013-11-11 DIAGNOSIS — IMO0002 Reserved for concepts with insufficient information to code with codable children: Secondary | ICD-10-CM

## 2013-11-11 DIAGNOSIS — K5900 Constipation, unspecified: Secondary | ICD-10-CM

## 2013-11-11 DIAGNOSIS — Z01419 Encounter for gynecological examination (general) (routine) without abnormal findings: Secondary | ICD-10-CM

## 2013-11-11 DIAGNOSIS — N816 Rectocele: Secondary | ICD-10-CM

## 2013-11-11 DIAGNOSIS — Z1212 Encounter for screening for malignant neoplasm of rectum: Secondary | ICD-10-CM

## 2013-11-11 LAB — HEMOCCULT GUIAC POC 1CARD (OFFICE): Fecal Occult Blood, POC: NEGATIVE

## 2013-11-11 NOTE — Progress Notes (Signed)
Patient ID: [name], female   DOB: 1966-07-16, 47 y.o.   MRN: [id number] History of Present Illness: [name] is a 47 year old white female,married in for a gyn physical.   Current Medications, Allergies, Past Medical History, Past Surgical History, Family History and Social History were reviewed in [employer] electronic medical record.   Past Medical History  Diagnosis Date  . Asthma     dxed at age 47  . Allergy   . Varicose veins   . Cataracts, bilateral   . Shortness of breath     SOB with exertion  . Depression   . Restless leg syndrome   . Migraine     last migraine:01/06/13  . Migraines 03/30/2013  . GERD (gastroesophageal reflux disease)   . Cystocele 10/29/2013  . Rectocele 10/29/2013  . Constipation 10/29/2013   Past Surgical History  Procedure Laterality Date  . Septoplasty    . Tubal ligation    . Abdominal hysterectomy    . Replacement total knee bilateral    . Nissen fundoplication    . Endovenous ablation saphenous vein w/ laser  12-12-2011    left greater saphenous vein     . Colonoscopy  03/12/2012    Procedure: COLONOSCOPY;  Surgeon: Malissa Hippo, MD;  [facility]: [facility];  Service: Endoscopy;  Laterality: N/A;  1200  . Tonsillectomy    . Cataract extraction w/phaco  07/13/2012    Procedure: CATARACT EXTRACTION PHACO AND INTRAOCULAR LENS PLACEMENT (IOC);  Surgeon: Susa Simmonds, MD;  Location: [facility];  Service: Ophthalmology;  Laterality: Right;  CDE:1.33  . Cataract extraction    . Cataract extraction w/phaco Left 01/11/2013    Procedure: CATARACT EXTRACTION PHACO AND INTRAOCULAR LENS PLACEMENT (IOC);  Surgeon: Susa Simmonds, MD;  Location: [facility];  Service: Ophthalmology;  Laterality: Left;  CDE 0.00  . Cesarean section    Current outpatient prescriptions:acetaminophen (TYLENOL) 500 MG tablet, Take 1,000 mg by mouth every 6 (six) hours as needed. For pain, Disp: , Rfl: ;  albuterol (VENTOLIN HFA) 108 (90 BASE) MCG/ACT inhaler, Inhale 2 puffs into  the lungs every 6 (six) hours as needed. For shortness of breath, Disp: , Rfl: ;  amphetamine-dextroamphetamine (ADDERALL) 20 MG tablet, Take 20 mg by mouth 2 (two) times daily., Disp: , Rfl:  azelastine (ASTELIN) 137 MCG/SPRAY nasal spray, 1 spray 2 (two) times daily. , Disp: , Rfl: ;  beclomethasone (QVAR) 80 MCG/ACT inhaler, Inhale 2 puffs into the lungs 2 (two) times daily., Disp: , Rfl: ;  buPROPion (WELLBUTRIN SR) 150 MG 12 hr tablet, Take 150 mg by mouth 2 (two) times daily., Disp: , Rfl: ;  butorphanol (STADOL) 10 MG/ML nasal spray, Place 1 spray into the nose as needed. , Disp: , Rfl:  dextromethorphan (DELSYM) 30 MG/5ML liquid, Take 60 mg by mouth as needed for cough., Disp: , Rfl: ;  DULERA 200-5 MCG/ACT AERO, Inhale 2 puffs into the lungs 2 (two) times daily. , Disp: , Rfl: ;  fluconazole (DIFLUCAN) 150 MG tablet, Take 1 now and 1 in 3 days, Disp: 2 tablet, Rfl: 2;  fluticasone (FLONASE) 50 MCG/ACT nasal spray, Place 1 spray into the nose 2 (two) times daily., Disp: , Rfl:  montelukast (SINGULAIR) 10 MG tablet, Take 10 mg by mouth at bedtime., Disp: , Rfl: ;  pantoprazole (PROTONIX) 40 MG tablet, Take 1 tablet (40 mg total) by mouth daily. Take 30-60 min before first meal of the day, Disp: 90 tablet, Rfl:  3;  pramipexole (MIRAPEX) 0.25 MG tablet, Take 0.5 mg by mouth every evening. , Disp: , Rfl: ;  rizatriptan (MAXALT) 10 MG tablet, Take 10 mg by mouth as needed. May repeat in 2 hours if needed , Disp: , Rfl:  topiramate (TOPAMAX) 200 MG tablet, Take 200 mg by mouth 2 (two) times daily.  , Disp: , Rfl: ;  oxyCODONE-acetaminophen (PERCOCET) 5-325 MG per tablet, Take 1 tablet by mouth every 4 (four) hours as needed. For migraine, Disp: , Rfl:   Review of Systems: Patient denies any  blurred vision, shortness of breath, chest pain, abdominal pain, problems with  intercourse. She has headaches 2-3 x weekly, has constipation and has rectocele.Will lose urine if coughs or sneezes.No current joint pain  or mood changes.Is having a Lap BSO,perineal plasty and posterior repair 12/07/13 in [place] by Dr Vincente Poli.    Physical Exam:BP 110/80  Ht 5' 4.5" (1.638 m)  Wt 169 lb (76.658 kg)  BMI 28.57 kg/m2 General:  Well developed, well nourished, no acute distress Skin:  Warm and dry Neck:  Midline trachea, normal thyroid Lungs; Clear to auscultation bilaterally Breast:  No dominant palpable mass, retraction, or nipple discharge Cardiovascular: Regular rate and rhythm Abdomen:  Soft, non tender, no hepatosplenomegaly Pelvic:  External genitalia is normal in appearance.  The vagina is normal in appearance for age,mild cystocele. The cervix and uterus are absent. No adnexal masses or tenderness noted. Rectal: Good sphincter tone, no polyps, or hemorrhoids felt.  Hemoccult negative.Has low rectocele. Extremities:  No swelling, has spider veins  Psych:  No mood changes,alert and cooperative,seems happy   Impression: Yearly gyn exam Rectocele Constipation Cystocele     Plan: Physical in 1 year Mammogram yearly  Labs in near future fasting

## 2013-11-11 NOTE — Patient Instructions (Signed)
Physical in 1 year Mammogram yearly Fasting labs in near future

## 2013-11-30 ENCOUNTER — Ambulatory Visit: Payer: BC Managed Care – PPO | Admitting: Vascular Surgery

## 2013-12-07 ENCOUNTER — Other Ambulatory Visit: Payer: Self-pay | Admitting: Obstetrics and Gynecology

## 2014-03-15 ENCOUNTER — Telehealth: Payer: Self-pay | Admitting: Adult Health

## 2014-03-15 NOTE — Telephone Encounter (Signed)
Pt states would Bethany Monaco, NP to do Thyroid panel due to feeling tired and unable to lose weight. Ok per [place], call transferred to front staff for an appt to be made.

## 2014-03-17 ENCOUNTER — Other Ambulatory Visit: Payer: BC Managed Care – PPO

## 2014-03-17 DIAGNOSIS — R5383 Other fatigue: Secondary | ICD-10-CM

## 2014-03-17 NOTE — Addendum Note (Signed)
Addended by: Doyne Keel on: 03/17/2014 04:02 PM   Modules accepted: Orders

## 2014-03-18 ENCOUNTER — Telehealth: Payer: Self-pay | Admitting: Adult Health

## 2014-03-18 LAB — THYROID ANTIBODIES: Thyroglobulin Ab: <20 U/mL (ref <40.0)

## 2014-03-18 LAB — T3, FREE: T3, Free: 2.8 pg/mL (ref 2.3–4.2)

## 2014-03-18 LAB — THYROID PANEL WITH TSH
FREE THYROXINE INDEX: 3 (ref 1.0–3.9)
T3 Uptake: 33.8 % (ref 22.5–37.0)
T4, Total: 9 ug/dL (ref 5.0–12.5)
TSH: 0.543 u[IU]/mL (ref 0.350–4.500)

## 2014-03-18 LAB — T4, FREE: Free T4: 1.15 ng/dL (ref 0.80–1.80)

## 2014-03-18 NOTE — Telephone Encounter (Signed)
Left message labs within normal range and T3 reverse pending

## 2014-03-21 ENCOUNTER — Telehealth: Payer: Self-pay | Admitting: Adult Health

## 2014-03-21 LAB — T3, REVERSE: T3 REVERSE: 18 ng/dL (ref 8–25)

## 2014-03-21 NOTE — Telephone Encounter (Signed)
T3 reverse 18  Which is normal,message left on voice mail

## 2014-08-24 ENCOUNTER — Ambulatory Visit (INDEPENDENT_AMBULATORY_CARE_PROVIDER_SITE_OTHER): Payer: BC Managed Care – PPO

## 2014-08-24 ENCOUNTER — Ambulatory Visit (INDEPENDENT_AMBULATORY_CARE_PROVIDER_SITE_OTHER): Payer: BC Managed Care – PPO | Admitting: Orthopedic Surgery

## 2014-08-24 VITALS — BP 113/67 | Ht 64.5 in | Wt 165.0 lb

## 2014-08-24 DIAGNOSIS — M1710 Unilateral primary osteoarthritis, unspecified knee: Secondary | ICD-10-CM

## 2014-08-24 DIAGNOSIS — IMO0002 Reserved for concepts with insufficient information to code with codable children: Secondary | ICD-10-CM

## 2014-08-24 NOTE — Progress Notes (Signed)
Chief Complaint  Patient presents with  . Follow-up    Right knee pain. DOS 11-21-06.    The patient plans of right knee pain status post bilateral total knees back in 2007. She is 8 years out from a rotating platform total knee. She has medial knee pain over the pes bursa she's had this before responded well to injection  She is functioning well without any major difficulties  Review of systems negative.pmh Past Medical History  Diagnosis Date  . Asthma     dxed at age 48  . Allergy   . Varicose veins   . Cataracts, bilateral   . Shortness of breath     SOB with exertion  . Depression   . Restless leg syndrome   . Migraine     last migraine:01/06/13  . Migraines 03/30/2013  . GERD (gastroesophageal reflux disease)   . Cystocele 10/29/2013  . Rectocele 10/29/2013  . Constipation 10/29/2013    BP 113/67  Ht 5' 4.5" (1.638 m)  Wt 165 lb (74.844 kg)  BMI 27.90 kg/m2  Her appearance is normal she is oriented to person place and time her mood is normal her affect is normal her gait and station are normal shows excellent flexion extension in both knees skin incisions healed nicely strength in both knees normal stability both knees normal in flexion and in extension. No effusion she is tender over the pes bursa was pulses are good lymph nodes negative sensation is normal bilaterally  X-ray show no loosening in either knee  Impression bursitis  Recommend bilateral injections followup in a year repeat x-rays  Procedure note  Injection  Verbal consent was obtained to inject the  bursa of both knees along the pes bursa  Timeout procedure was completed to confirm injection site  Diagnosis is bursitis  Bilateral injections  Medications used Depo-Medrol 40 mg 1 cc Lidocaine 1% plain 3 cc  Anesthesia was provided by ethyl chloride spray  Prep was performed with alcohol  Technique of injection  after cleaning the knee with alcohol and spraying with ethyl chloride the  injection site was entered and the medication was placed. This was then repeated in the opposite knee   No complications were noted

## 2014-08-24 NOTE — Patient Instructions (Signed)
Knee Injection Joint injections are shots. Your caregiver will place a needle into your knee joint. The needle is used to put medicine into the joint. These shots can be used to help treat different painful knee conditions such as osteoarthritis, bursitis, local flare-ups of rheumatoid arthritis, and pseudogout. Anti-inflammatory medicines such as corticosteroids and anesthetics are the most common medicines used for joint and soft tissue injections.  PROCEDURE  The skin over the kneecap will be cleaned with an antiseptic solution.  Your caregiver will inject a small amount of a local anesthetic (a medicine like Novocaine) just under the skin in the area that was cleaned.  After the area becomes numb, a second injection is done. This second injection usually includes an anesthetic and an anti-inflammatory medicine called a steroid or cortisone. The needle is carefully placed in between the kneecap and the knee, and the medicine is injected into the joint space.  After the injection is done, the needle is removed. Your caregiver may place a bandage over the injection site. The whole procedure takes no more than a couple of minutes. BEFORE THE PROCEDURE  Wash all of the skin around the entire knee area. Try to remove any loose, scaling skin. There is no other specific preparation necessary unless advised otherwise by your caregiver. LET YOUR CAREGIVER KNOW ABOUT:   Allergies.  Medications taken including herbs, eye drops, over the counter medications, and creams.  Use of steroids (by mouth or creams).  Possible pregnancy, if applicable.  Previous problems with anesthetics or Novocaine.  History of blood clots (thrombophlebitis).  History of bleeding or blood problems.  Previous surgery.  Other health problems. RISKS AND COMPLICATIONS Side effects from cortisone shots are rare. They include:   Slight bruising of the skin.  Shrinkage of the normal fatty tissue under the skin where  the shot was given.  Increase in pain after the shot.  Infection.  Weakening of tendons or tendon rupture.  Allergic reaction to the medicine.  Diabetics may have a temporary increase in their blood sugar after a shot.  Cortisone can temporarily weaken the immune system. While receiving these shots, you should not get certain vaccines. Also, avoid contact with anyone who has chickenpox or measles. Especially if you have never had these diseases or have not been previously immunized. Your immune system may not be strong enough to fight off the infection while the cortisone is in your system. AFTER THE PROCEDURE   You can go home after the procedure.  You may need to put ice on the joint 15-20 minutes every 3 or 4 hours until the pain goes away.  You may need to put an elastic bandage on the joint. HOME CARE INSTRUCTIONS   Only take over-the-counter or prescription medicines for pain, discomfort, or fever as directed by your caregiver.  You should avoid stressing the joint. Unless advised otherwise, avoid activities that put a lot of pressure on a knee joint, such as:  Jogging.  Bicycling.  Recreational climbing.  Hiking.  Laying down and elevating the leg/knee above the level of your heart can help to minimize swelling. SEEK MEDICAL CARE IF:   You have repeated or worsening swelling.  There is drainage from the puncture area.  You develop red streaking that extends above or below the site where the needle was inserted. SEEK IMMEDIATE MEDICAL CARE IF:   You develop a fever.  You have pain that gets worse even though you are taking pain medicine.  The area is   red and warm, and you have trouble moving the joint. MAKE SURE YOU:   Understand these instructions.  Will watch your condition.  Will get help right away if you are not doing well or get worse. Document Released: 02/02/2007 Document Revised: 02/03/2012 Document Reviewed: 10/30/2007 Exit[employer] Patient  Information 2015 [employer]. This information is not intended to replace advice given to you by your health care provider. Make sure you discuss any questions you have with your health care provider.  

## 2014-08-25 ENCOUNTER — Ambulatory Visit: Payer: BC Managed Care – PPO | Admitting: Orthopedic Surgery

## 2014-09-09 ENCOUNTER — Other Ambulatory Visit: Payer: Self-pay

## 2014-09-09 DIAGNOSIS — Z1231 Encounter for screening mammogram for malignant neoplasm of breast: Secondary | ICD-10-CM

## 2014-09-15 ENCOUNTER — Ambulatory Visit (HOSPITAL_COMMUNITY): Payer: BC Managed Care – PPO

## 2014-09-21 ENCOUNTER — Ambulatory Visit (HOSPITAL_COMMUNITY)
Admission: RE | Admit: 2014-09-21 | Discharge: 2014-09-21 | Disposition: A | Discharge status: Home / Self Care | Payer: BC Managed Care – PPO | Source: Ambulatory Visit | Attending: Pulmonary Disease | Admitting: Pulmonary Disease

## 2014-09-21 DIAGNOSIS — Z1231 Encounter for screening mammogram for malignant neoplasm of breast: Secondary | ICD-10-CM | POA: Diagnosis not present

## 2014-09-23 ENCOUNTER — Other Ambulatory Visit: Payer: Self-pay

## 2014-09-26 ENCOUNTER — Encounter: Payer: Self-pay | Admitting: Adult Health

## 2014-09-28 ENCOUNTER — Telehealth: Payer: Self-pay | Admitting: Orthopedic Surgery

## 2014-09-28 NOTE — Telephone Encounter (Signed)
Patient is calling asking does she need to take an antibiotic before a dental procedure due to knee replacement, if so the dentist office states its now the Orthopedic Dr.s responsibility. Please advise, uses [facility] in [place]

## 2014-09-29 ENCOUNTER — Other Ambulatory Visit: Payer: Self-pay | Admitting: *Deleted

## 2014-09-29 MED ORDER — CLINDAMYCIN HCL 300 MG PO CAPS: ORAL_CAPSULE | ORAL | Status: DC

## 2014-09-29 NOTE — Telephone Encounter (Signed)
Script sent to pharmacy, left vm for patient

## 2014-12-08 ENCOUNTER — Telehealth: Payer: Self-pay | Admitting: *Deleted

## 2014-12-08 MED ORDER — FLUCONAZOLE 150 MG PO TABS: ORAL_TABLET | ORAL | Status: DC

## 2014-12-08 NOTE — Telephone Encounter (Signed)
Spoke with pt. She has been on antibiotics and now has a yeast infection. She is requesting Diflucan and says it usually takes more than 1 tab to get rid of it. Can you order Diflucan? Thanks!! [place]

## 2014-12-08 NOTE — Telephone Encounter (Signed)
Pt aware that refills sent

## 2014-12-08 NOTE — Telephone Encounter (Signed)
Refilled diflucan

## 2014-12-26 ENCOUNTER — Ambulatory Visit: Payer: Self-pay | Admitting: Orthopedic Surgery

## 2014-12-27 ENCOUNTER — Other Ambulatory Visit (HOSPITAL_COMMUNITY): Payer: Self-pay | Admitting: Pulmonary Disease

## 2014-12-27 ENCOUNTER — Ambulatory Visit (HOSPITAL_COMMUNITY)
Admission: RE | Admit: 2014-12-27 | Discharge: 2014-12-27 | Disposition: A | Discharge status: Home / Self Care | Payer: BLUE CROSS/BLUE SHIELD | Source: Ambulatory Visit | Attending: Pulmonary Disease | Admitting: Pulmonary Disease

## 2014-12-27 DIAGNOSIS — R059 Cough, unspecified: Secondary | ICD-10-CM

## 2014-12-27 DIAGNOSIS — R05 Cough: Secondary | ICD-10-CM

## 2015-01-12 ENCOUNTER — Ambulatory Visit (INDEPENDENT_AMBULATORY_CARE_PROVIDER_SITE_OTHER): Payer: BLUE CROSS/BLUE SHIELD | Admitting: Adult Health

## 2015-01-12 ENCOUNTER — Encounter: Payer: Self-pay | Admitting: Adult Health

## 2015-01-12 VITALS — BP 138/88 | HR 78 | Ht 64.5 in | Wt 172.0 lb

## 2015-01-12 DIAGNOSIS — Z01419 Encounter for gynecological examination (general) (routine) without abnormal findings: Secondary | ICD-10-CM

## 2015-01-12 DIAGNOSIS — K649 Unspecified hemorrhoids: Secondary | ICD-10-CM | POA: Insufficient documentation

## 2015-01-12 DIAGNOSIS — Z1212 Encounter for screening for malignant neoplasm of rectum: Secondary | ICD-10-CM

## 2015-01-12 DIAGNOSIS — N816 Rectocele: Secondary | ICD-10-CM

## 2015-01-12 HISTORY — DX: Unspecified hemorrhoids: K64.9

## 2015-01-12 LAB — HEMOCCULT GUIAC POC 1CARD (OFFICE): FECAL OCCULT BLD: NEGATIVE

## 2015-01-12 NOTE — Progress Notes (Signed)
Patient ID: [name], female   DOB: 12-02-1965, 49 y.o.   MRN: [id number] History of Present Illness:  [name] is a 49 year old white female, married, in for well woman gyn exam.She has had some rectal bleeding with BMs, bright red and no pain.She is having some hot flashes at night and not sleeping well.Is using low dose patch. Says she can't lose weight but has no will power to stick with it.  Current Medications, Allergies, Past Medical History, Past Surgical History, Family History and Social History were reviewed in [employer] record.     Review of Systems: Patient denies any  hearing loss, fatigue, blurred vision, shortness of breath, chest pain, abdominal pain, problems with bowel movements,  or intercourse. No joint swelling knees hurt and she takes aleve or mood swings, on meds.Has migraines and has asthma with productive cough since November, has seen Dr Luan Pulling and meds were changed and had negative chest xray.Has SUI.    Physical Exam:BP 138/88 mmHg  Pulse 78  Ht 5' 4.5" (1.638 m)  Wt 172 lb (78.019 kg)  BMI 29.08 kg/m2 General:  Well developed, well nourished, no acute distress Skin:  Warm and dry Neck:  Midline trachea, normal thyroid, good ROM, no lymphadenopathy Lungs; Clear to auscultation bilaterally Breast:  No dominant palpable mass, retraction, or nipple discharge Cardiovascular: Regular rate and rhythm Abdomen:  Soft, non tender, no hepatosplenomegaly Pelvic:  External genitalia is normal in appearance, no lesions.  The vagina is normal in appearance, with good color,moisture and rugae. Urethra has no lesions or masses. The cervix and uterus are absent.  No adnexal masses or tenderness noted.Bladder is non tender, no masses felt. Rectal: Good sphincter tone, no polyps,internal hemorrhoids felt.  Hemoccult negative.Has low rectocele Extremities/musculoskeletal:  No swelling, has varicose veins noted, no clubbing or cyanosis Psych:  No mood  changes, alert and cooperative,seems happy If has any more bleeding call, discussed banding and she is not interested now.She wants something for sleep and will back with name of med.  Impression: Well woman gyn exam no pap Mild low rectocele Internal hemorrhoids    Plan: Physical in 1 year Mammogram yearly Labs at work Colonoscopy per Dr Laural Golden

## 2015-01-12 NOTE — Patient Instructions (Signed)
Mammogram yearly Physical in 1 year Labs at work  Colonoscopy per Dr Laural Golden

## 2015-01-16 ENCOUNTER — Telehealth: Payer: Self-pay | Admitting: *Deleted

## 2015-01-16 MED ORDER — TEMAZEPAM 15 MG PO CAPS: 15.0000 mg | ORAL_CAPSULE | Freq: Every evening | ORAL | Status: DC | PRN

## 2015-01-16 NOTE — Telephone Encounter (Signed)
Pt called requests rx for restoril has used in past for sleep will give 30

## 2015-03-18 ENCOUNTER — Other Ambulatory Visit: Payer: Self-pay | Admitting: Adult Health

## 2015-03-23 ENCOUNTER — Other Ambulatory Visit: Payer: Self-pay | Admitting: Adult Health

## 2015-05-02 ENCOUNTER — Ambulatory Visit (HOSPITAL_COMMUNITY): Payer: BLUE CROSS/BLUE SHIELD

## 2015-05-02 ENCOUNTER — Other Ambulatory Visit (HOSPITAL_COMMUNITY): Payer: Self-pay | Admitting: Pulmonary Disease

## 2015-05-02 DIAGNOSIS — R1110 Vomiting, unspecified: Secondary | ICD-10-CM

## 2015-05-02 DIAGNOSIS — R1011 Right upper quadrant pain: Secondary | ICD-10-CM

## 2015-05-03 ENCOUNTER — Ambulatory Visit
Admission: RE | Admit: 2015-05-03 | Discharge: 2015-05-03 | Disposition: A | Discharge status: Home / Self Care | Payer: BLUE CROSS/BLUE SHIELD | Source: Ambulatory Visit | Attending: Pulmonary Disease | Admitting: Pulmonary Disease

## 2015-05-03 DIAGNOSIS — R1110 Vomiting, unspecified: Secondary | ICD-10-CM

## 2015-05-03 DIAGNOSIS — R1011 Right upper quadrant pain: Secondary | ICD-10-CM

## 2015-05-05 ENCOUNTER — Other Ambulatory Visit (HOSPITAL_COMMUNITY): Payer: BLUE CROSS/BLUE SHIELD

## 2015-05-08 ENCOUNTER — Encounter (INDEPENDENT_AMBULATORY_CARE_PROVIDER_SITE_OTHER): Payer: Self-pay | Admitting: Internal Medicine

## 2015-05-09 ENCOUNTER — Encounter (INDEPENDENT_AMBULATORY_CARE_PROVIDER_SITE_OTHER): Payer: Self-pay | Admitting: Internal Medicine

## 2015-05-09 ENCOUNTER — Ambulatory Visit (INDEPENDENT_AMBULATORY_CARE_PROVIDER_SITE_OTHER): Payer: BLUE CROSS/BLUE SHIELD | Admitting: Internal Medicine

## 2015-05-09 VITALS — BP 118/80 | HR 72 | Temp 98.0°F | Ht 65.0 in | Wt 174.0 lb

## 2015-05-09 DIAGNOSIS — R140 Abdominal distension (gaseous): Secondary | ICD-10-CM

## 2015-05-09 LAB — CBC WITH DIFFERENTIAL/PLATELET
BASOS ABS: 0.1 10*3/uL (ref 0.0–0.1)
BASOS PCT: 1 % (ref 0–1)
Eosinophils Absolute: 0.4 10*3/uL (ref 0.0–0.7)
Eosinophils Relative: 4 % (ref 0–5)
HEMATOCRIT: 41.1 % (ref 36.0–46.0)
Hemoglobin: 13.7 g/dL (ref 12.0–15.0)
Lymphocytes Relative: 25 % (ref 12–46)
Lymphs Abs: 2.3 10*3/uL (ref 0.7–4.0)
MCH: 29 pg (ref 26.0–34.0)
MCHC: 33.3 g/dL (ref 30.0–36.0)
MCV: 87.1 fL (ref 78.0–100.0)
MONO ABS: 0.8 10*3/uL (ref 0.1–1.0)
MPV: 9.6 fL (ref 8.6–12.4)
Monocytes Relative: 9 % (ref 3–12)
NEUTROS ABS: 5.6 10*3/uL (ref 1.7–7.7)
NEUTROS PCT: 61 % (ref 43–77)
PLATELETS: 272 10*3/uL (ref 150–400)
RBC: 4.72 MIL/uL (ref 3.87–5.11)
RDW: 14.1 % (ref 11.5–15.5)
WBC: 9.2 10*3/uL (ref 4.0–10.5)

## 2015-05-09 NOTE — Progress Notes (Signed)
Subjective:    Patient ID: [name], female    DOB: 03-26-1966, 49 y.o.   MRN: [id number]  HPI Presents today with c/o bloating.  She has pain in her epigastric region and sometimes it is in her rt upper quadrant and sometimes it will radiate into her rt back. Occurs 5-10 minutes after eating.  All foods bother her. Today she had broth and a few saltines at lunch .She says she feels bloated now. Symptoms on and off for a couple of months.  Worse in the past few weeks.  She denies any acid reflux. She takes Protonix BID which helps.  There has been no weight. She has gained about 4 pounds over the past month. She usually has a BM one a day. She says she never feels completely empty. Occasionally see blood when she wipes. Hx of [employer] Fundoplication years ago 11/26/8784 US abdomen: Nausea, vomiting, abdominal pain IMPRESSION: Negative ultrasound of the right upper quadrant.      03/12/2012 Colonoscopy Dr. Laural Golden: Indications: Patient is 49 year old occasions he recent two-week episode of intermittent hematochezia associated with change in her bowel habits. Impression:  Examination performed to the cecum. Single small polyp ablated via cold biopsy from sigmoid colon. Small external hemorrhoids but no evidence of diverticulosis or colitis.  Biopsy results reviewed with [name]. Single tubular adenoma. Next colonoscopy in 7 years     Review of Systems Past Medical History  Diagnosis Date  . Asthma     dxed at age 37  . Allergy   . Varicose veins   . Cataracts, bilateral   . Shortness of breath     SOB with exertion  . Depression   . Restless leg syndrome   . Migraine     last migraine:01/06/13  . Migraines 03/30/2013  . GERD (gastroesophageal reflux disease)   . Cystocele 10/29/2013  . Rectocele 10/29/2013  . Constipation 10/29/2013  . SUI (stress urinary incontinence, female)   . Hemorrhoid 01/12/2015    Past Surgical History  Procedure Laterality Date  . Septoplasty      . Tubal ligation    . Abdominal hysterectomy    . Replacement total knee bilateral    . Nissen fundoplication    . Endovenous ablation saphenous vein w/ laser  12-12-2011    left greater saphenous vein     . Colonoscopy  03/12/2012    Procedure: COLONOSCOPY;  Surgeon: Rogene Houston, MD;  [facility];  Service: Endoscopy;  Laterality: N/A;  1200  . Tonsillectomy    . Cataract extraction w/phaco  07/13/2012    Procedure: CATARACT EXTRACTION PHACO AND INTRAOCULAR LENS PLACEMENT (IOC);  Surgeon: Williams Che, MD;  Location: [facility];  Service: Ophthalmology;  Laterality: Right;  CDE:1.33  . Cataract extraction    . Cataract extraction w/phaco Left 01/11/2013    Procedure: CATARACT EXTRACTION PHACO AND INTRAOCULAR LENS PLACEMENT (IOC);  Surgeon: Williams Che, MD;  Location: [facility];  Service: Ophthalmology;  Laterality: Left;  CDE 0.00  . Cesarean section    . Bilateral oopherectomy      with posterior repair; perineoplasty    Allergies  Allergen Reactions  . Hydrocodone Itching    Itching,   . Aminophylline Rash  . Nystatin Rash  . Penicillins Rash  . Sulfamethoxazole-Trimethoprim Rash    Pt has taken before but DS causes rash. Single strength does not    Current Outpatient Prescriptions on File Prior to Visit  Medication Sig Dispense Refill  .  acetaminophen (TYLENOL) 500 MG tablet Take 1,000 mg by mouth every 6 (six) hours as needed. For pain    . albuterol (VENTOLIN HFA) 108 (90 BASE) MCG/ACT inhaler Inhale 2 puffs into the lungs every 6 (six) hours as needed. For shortness of breath    . amphetamine-dextroamphetamine (ADDERALL) 20 MG tablet Take 20 mg by mouth 2 (two) times daily.    Marland Kitchen azelastine (ASTELIN) 137 MCG/SPRAY nasal spray 1 spray 2 (two) times daily.     . beclomethasone (QVAR) 80 MCG/ACT inhaler Inhale 2 puffs into the lungs 2 (two) times daily.    Marland Kitchen buPROPion (WELLBUTRIN SR) 150 MG 12 hr tablet Take 150 mg by mouth 2 (two) times daily.    Marland Kitchen EPIPEN  2-PAK 0.3 MG/0.3ML SOAJ injection as needed.   5  . fluticasone (FLONASE) 50 MCG/ACT nasal spray Place 1 spray into the nose 2 (two) times daily.    . furosemide (LASIX) 40 MG tablet Take 40 mg by mouth as needed.   12  . montelukast (SINGULAIR) 10 MG tablet Take 10 mg by mouth at bedtime.    Marland Kitchen NATURE-THROID 32.5 MG tablet 32.5 mg daily.   0  . pantoprazole (PROTONIX) 40 MG tablet Take 1 tablet (40 mg total) by mouth daily. Take 30-60 min before first meal of the day (Patient taking differently: Take 40 mg by mouth 2 (two) times daily. Take 30-60 min before first meal of the day) 90 tablet 3  . RESTASIS 0.05 % ophthalmic emulsion Place 1 drop into both eyes as needed.   3  . rizatriptan (MAXALT) 10 MG tablet Take 10 mg by mouth as needed. May repeat in 2 hours if needed     . temazepam (RESTORIL) 15 MG capsule TAKE ONE CAPSULE BY MOUTH AT BEDTIME AS NEEDED FOR SLEEP 30 capsule 0   No current facility-administered medications on file prior to visit.    Married. Works at [employer] in [place]. 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       Objective:   Physical ExamBlood pressure 118/80, pulse 72, temperature 98 F (36.7 C), height 5\' 5"  (1.651 m), weight 174 lb (78.926 kg).  Alert and oriented. Skin warm and dry. Oral mucosa is moist.   . Sclera anicteric, conjunctivae is pink. Thyroid not enlarged. No cervical lymphadenopathy. Lungs clear. Heart regular rate and rhythm.  Abdomen is soft. Bowel sounds are positive. No hepatomegaly. No abdominal masses felt. No tenderness.  No edema to lower extremities.         Assessment & Plan:  Rt upper quadrant tenderness at times. Bloating. Would like to rule out GB disease. Am going to get a HIDA scan. If normal she will need an EGD.

## 2015-05-09 NOTE — Patient Instructions (Signed)
CBC, and CMET. HIDA scan. If normal, EGD.

## 2015-05-10 LAB — HEPATIC FUNCTION PANEL
ALT: 26 U/L (ref 0–35)
AST: 25 U/L (ref 0–37)
Albumin: 3.8 g/dL (ref 3.5–5.2)
Alkaline Phosphatase: 54 U/L (ref 39–117)
BILIRUBIN INDIRECT: 0.4 mg/dL (ref 0.2–1.2)
Bilirubin, Direct: 0.1 mg/dL (ref 0.0–0.3)
Total Bilirubin: 0.5 mg/dL (ref 0.2–1.2)
Total Protein: 6.2 g/dL (ref 6.0–8.3)

## 2015-05-11 ENCOUNTER — Other Ambulatory Visit (HOSPITAL_COMMUNITY): Payer: BLUE CROSS/BLUE SHIELD

## 2015-05-12 ENCOUNTER — Encounter (HOSPITAL_COMMUNITY): Payer: Self-pay

## 2015-05-12 ENCOUNTER — Encounter (HOSPITAL_COMMUNITY)
Admission: RE | Admit: 2015-05-12 | Discharge: 2015-05-12 | Disposition: A | Discharge status: Home / Self Care | Payer: BLUE CROSS/BLUE SHIELD | Source: Ambulatory Visit | Attending: Internal Medicine | Admitting: Internal Medicine

## 2015-05-12 DIAGNOSIS — R140 Abdominal distension (gaseous): Secondary | ICD-10-CM | POA: Insufficient documentation

## 2015-05-12 MED ORDER — SODIUM CHLORIDE 0.9 % IJ SOLN
INTRAMUSCULAR | Status: AC
  Filled 2015-05-12: qty 42

## 2015-05-12 MED ORDER — TECHNETIUM TC 99M MEBROFENIN IV KIT
5.0000 | PACK | Freq: Once | INTRAVENOUS | Status: AC | PRN
  Administered 2015-05-12: 5 via INTRAVENOUS

## 2015-05-12 MED ORDER — SINCALIDE 5 MCG IJ SOLR
INTRAMUSCULAR | Status: AC
Start: 2015-05-12 — End: 2015-05-12
  Administered 2015-05-12: 1.59 ug via INTRAVENOUS
  Filled 2015-05-12: qty 5

## 2015-05-12 MED ORDER — STERILE WATER FOR INJECTION IJ SOLN
INTRAMUSCULAR | Status: AC
Start: 2015-05-12 — End: 2015-05-12
  Administered 2015-05-12: 5 mL via INTRAVENOUS
  Filled 2015-05-12: qty 10

## 2015-05-22 ENCOUNTER — Other Ambulatory Visit (INDEPENDENT_AMBULATORY_CARE_PROVIDER_SITE_OTHER): Payer: Self-pay | Admitting: *Deleted

## 2015-05-22 DIAGNOSIS — R140 Abdominal distension (gaseous): Secondary | ICD-10-CM

## 2015-06-01 ENCOUNTER — Encounter (HOSPITAL_COMMUNITY): Payer: Self-pay

## 2015-06-01 ENCOUNTER — Ambulatory Visit (HOSPITAL_COMMUNITY)
Admission: RE | Admit: 2015-06-01 | Discharge: 2015-06-01 | Disposition: A | Discharge status: Home / Self Care | Payer: BLUE CROSS/BLUE SHIELD | Source: Ambulatory Visit | Attending: Internal Medicine | Admitting: Internal Medicine

## 2015-06-01 ENCOUNTER — Encounter (HOSPITAL_COMMUNITY)
Admission: RE | Disposition: A | Discharge status: Home / Self Care | Payer: Self-pay | Source: Ambulatory Visit | Attending: Internal Medicine

## 2015-06-01 DIAGNOSIS — Z9889 Other specified postprocedural states: Secondary | ICD-10-CM | POA: Diagnosis not present

## 2015-06-01 DIAGNOSIS — R140 Abdominal distension (gaseous): Secondary | ICD-10-CM

## 2015-06-01 DIAGNOSIS — J45909 Unspecified asthma, uncomplicated: Secondary | ICD-10-CM | POA: Insufficient documentation

## 2015-06-01 DIAGNOSIS — R1013 Epigastric pain: Secondary | ICD-10-CM | POA: Diagnosis not present

## 2015-06-01 DIAGNOSIS — Z880 Allergy status to penicillin: Secondary | ICD-10-CM | POA: Insufficient documentation

## 2015-06-01 DIAGNOSIS — Z87891 Personal history of nicotine dependence: Secondary | ICD-10-CM | POA: Diagnosis not present

## 2015-06-01 DIAGNOSIS — G43909 Migraine, unspecified, not intractable, without status migrainosus: Secondary | ICD-10-CM | POA: Insufficient documentation

## 2015-06-01 DIAGNOSIS — K2950 Unspecified chronic gastritis without bleeding: Secondary | ICD-10-CM | POA: Insufficient documentation

## 2015-06-01 DIAGNOSIS — K219 Gastro-esophageal reflux disease without esophagitis: Secondary | ICD-10-CM | POA: Diagnosis not present

## 2015-06-01 DIAGNOSIS — F329 Major depressive disorder, single episode, unspecified: Secondary | ICD-10-CM | POA: Diagnosis not present

## 2015-06-01 DIAGNOSIS — G2581 Restless legs syndrome: Secondary | ICD-10-CM | POA: Insufficient documentation

## 2015-06-01 DIAGNOSIS — K2960 Other gastritis without bleeding: Secondary | ICD-10-CM | POA: Diagnosis not present

## 2015-06-01 HISTORY — PX: ESOPHAGOGASTRODUODENOSCOPY: SHX5428

## 2015-06-01 SURGERY — EGD (ESOPHAGOGASTRODUODENOSCOPY)
Anesthesia: Moderate Sedation

## 2015-06-01 MED ORDER — MIDAZOLAM HCL 5 MG/5ML IJ SOLN
INTRAMUSCULAR | Status: DC | PRN
  Administered 2015-06-01 (×3): 2 mg via INTRAVENOUS
  Administered 2015-06-01: 1 mg via INTRAVENOUS

## 2015-06-01 MED ORDER — SODIUM CHLORIDE 0.9 % IV SOLN
INTRAVENOUS | Status: DC
  Administered 2015-06-01: 14:00:00 via INTRAVENOUS

## 2015-06-01 MED ORDER — DICYCLOMINE HCL 10 MG PO CAPS: 10.0000 mg | ORAL_CAPSULE | Freq: Three times a day (TID) | ORAL | Status: DC

## 2015-06-01 MED ORDER — SIMETHICONE 40 MG/0.6ML PO SUSP
ORAL | Status: DC | PRN
  Administered 2015-06-01: 17:00:00

## 2015-06-01 MED ORDER — MEPERIDINE HCL 50 MG/ML IJ SOLN
INTRAMUSCULAR | Status: AC
  Filled 2015-06-01: qty 1

## 2015-06-01 MED ORDER — BUTAMBEN-TETRACAINE-BENZOCAINE 2-2-14 % EX AERO
INHALATION_SPRAY | CUTANEOUS | Status: DC | PRN
  Administered 2015-06-01: 1 via TOPICAL

## 2015-06-01 MED ORDER — MIDAZOLAM HCL 5 MG/5ML IJ SOLN
INTRAMUSCULAR | Status: AC
  Filled 2015-06-01: qty 10

## 2015-06-01 MED ORDER — MEPERIDINE HCL 50 MG/ML IJ SOLN
INTRAMUSCULAR | Status: DC | PRN
  Administered 2015-06-01 (×2): 25 mg via INTRAVENOUS

## 2015-06-01 NOTE — Op Note (Signed)
EGD PROCEDURE REPORT  PATIENT:  [name]  MR#:  [id number] Birthdate:  20-Mar-1966, 49 y.o., female Endoscopist:  Dr. Rogene Houston, MD Referred By:  Dr. Alonza Bogus, MD  Procedure Date: 06/01/2015  Procedure:   EGD  Indications:  Patient is 49 year old Caucasian female who presents for 7 month history of postprandial bloating and epigastric pain. Pain has been radiating to her right upper quadrant. Ultrasound was negative for only lithiasis. HIDA scan with CCK was normal. Similarly LFTs are normal..            Informed Consent:  The risks, benefits, alternatives & imponderables which include, but are not limited to, bleeding, infection, perforation, drug reaction and potential missed lesion have been reviewed.  The potential for biopsy, lesion removal, esophageal dilation, etc. have also been discussed.  Questions have been answered.  All parties agreeable.  Please see history & physical in medical record for more information.  Medications:  Demerol 50 mg IV Versed 7 mg IV Cetacaine spray topically for oropharyngeal anesthesia  Description of procedure:  The endoscope was introduced through the mouth and advanced to the second portion of the duodenum without difficulty or limitations. The mucosal surfaces were surveyed very carefully during advancement of the scope and upon withdrawal.  Findings:  Esophagus:     Mucosa of the esophagus was normal. EEG junction was unremarkable. GEJ:  34 cm. Stomach:  Stomach was empty and distended very well with insufflation. Folds in the proximal stomach were normal. Examination mucosa gastric body was normal. In the antrum there was patchy erythema but no erosions or ulcers noted. Pyloric channel was patent. Annulus was unremarkable. Retroflex view revealed intact fundal wrap. Pictures taken for the record. Duodenum:  Normal bulbar and post bulbar mucosa.  Therapeutic/Diagnostic Maneuvers Performed:  Antral biopsy taken for routine  histology.  Complications:  None  Impression:  No evidence of erosive esophagitis.  Intact fundal wrap.  Nonerosive antral gastritis. Antral biopsy taken.  Recommendations:  Standard instructions given.  Dicyclomine 10 mg by mouth before each meal.  I will be contacting patient with biopsy results and further recommendations.  Giah Fickett U  06/01/2015  5:01 PM  CC: Dr. Alonza Bogus, MD & Dr. Rayne Du ref. provider found

## 2015-06-01 NOTE — H&P (Signed)
[name] is an 49 y.o. female.   Chief Complaint:  Patient is here for EGD. HPI:  She was 49 year old Caucasian female who presents with several month history of postprandial bloating and epigastric pain. Pain has been radiating to her right upper quadrant and posteriorly. She's not been able to vomit since she has had lap Nissen. She denies heartburn. She's had some nausea. She has gained about 6 pounds this year. She denies melena or rectal bleeding.  She had upper abdominal ultrasound was negative for cholelithiasis. HIDA scan with CCK similarly was normal. She states she had minimal reproduction of her symptoms.  LFTs are normal.  Past Medical History  Diagnosis Date  . Asthma     dxed at age 30  . Allergy   . Varicose veins   . Cataracts, bilateral   . Shortness of breath     SOB with exertion  . Depression   . Restless leg syndrome   . Migraine     last migraine:01/06/13  . Migraines 03/30/2013  . GERD (gastroesophageal reflux disease)   . Cystocele 10/29/2013  . Rectocele 10/29/2013  . Constipation 10/29/2013  . SUI (stress urinary incontinence, female)   . Hemorrhoid 01/12/2015    Past Surgical History  Procedure Laterality Date  . Septoplasty    . Tubal ligation    . Abdominal hysterectomy    . Replacement total knee bilateral    . Nissen fundoplication    . Endovenous ablation saphenous vein w/ laser  12-12-2011    left greater saphenous vein     . Colonoscopy  03/12/2012    Procedure: COLONOSCOPY;  Surgeon: Rogene Houston, MD;  [facility];  Service: Endoscopy;  Laterality: N/A;  1200  . Tonsillectomy    . Cataract extraction w/phaco  07/13/2012    Procedure: CATARACT EXTRACTION PHACO AND INTRAOCULAR LENS PLACEMENT (IOC);  Surgeon: Williams Che, MD;  Location: [facility];  Service: Ophthalmology;  Laterality: Right;  CDE:1.33  . Cataract extraction    . Cataract extraction w/phaco Left 01/11/2013    Procedure: CATARACT EXTRACTION PHACO AND INTRAOCULAR  LENS PLACEMENT (IOC);  Surgeon: Williams Che, MD;  Location: [facility];  Service: Ophthalmology;  Laterality: Left;  CDE 0.00  . Cesarean section    . Bilateral oopherectomy      with posterior repair; perineoplasty    Family History  Problem Relation Age of Onset  . Diabetes Mother   . Depression Mother   . Diabetes type II Mother   . Migraines Sister   . Other Sister     migranes  . Depression Sister   . Fibromyalgia Sister   . Hypertension Sister   . Hypertension Father   . Heart failure Father   . Asthma Maternal Grandmother   . Asthma Child   . Hypertension Brother   . Cancer Paternal Grandmother    Social History:  reports that she quit smoking about 33 years ago. Her smoking use included Cigarettes. She has a .5 pack-year smoking history. She has never used smokeless tobacco. She reports that she drinks alcohol. She reports that she does not use illicit drugs.  Allergies:  Allergies  Allergen Reactions  . Hydrocodone Itching    Itching,   . Aminophylline Rash  . Nystatin Rash  . Penicillins Rash  . Sulfamethoxazole-Trimethoprim Rash    Pt has taken before but DS causes rash. Single strength does not    Medications Prior to Admission  Medication Sig Dispense  Refill  . albuterol (VENTOLIN HFA) 108 (90 BASE) MCG/ACT inhaler Inhale 2 puffs into the lungs every 6 (six) hours as needed. For shortness of breath    . amphetamine-dextroamphetamine (ADDERALL) 20 MG tablet Take 20 mg by mouth 2 (two) times daily.    Marland Kitchen azelastine (ASTELIN) 137 MCG/SPRAY nasal spray 1 spray 2 (two) times daily.     Marland Kitchen buPROPion (WELLBUTRIN SR) 150 MG 12 hr tablet Take 150 mg by mouth 2 (two) times daily.    . fluticasone (FLONASE) 50 MCG/ACT nasal spray Place 1 spray into the nose 2 (two) times daily.    . montelukast (SINGULAIR) 10 MG tablet Take 10 mg by mouth at bedtime.    Marland Kitchen NATURE-THROID 32.5 MG tablet 32.5 mg daily.   0  . pantoprazole (PROTONIX) 40 MG tablet Take 1 tablet (40 mg total)  by mouth daily. Take 30-60 min before first meal of the day (Patient taking differently: Take 40 mg by mouth 2 (two) times daily. Take 30-60 min before first meal of the day) 90 tablet 3  . RESTASIS 0.05 % ophthalmic emulsion Place 1 drop into both eyes as needed.   3  . rizatriptan (MAXALT) 10 MG tablet Take 10 mg by mouth as needed. May repeat in 2 hours if needed     . acetaminophen (TYLENOL) 500 MG tablet Take 1,000 mg by mouth every 6 (six) hours as needed. For pain    . beclomethasone (QVAR) 80 MCG/ACT inhaler Inhale 2 puffs into the lungs 2 (two) times daily.    Marland Kitchen EPIPEN 2-PAK 0.3 MG/0.3ML SOAJ injection as needed.   5  . furosemide (LASIX) 40 MG tablet Take 40 mg by mouth as needed.   12  . temazepam (RESTORIL) 15 MG capsule TAKE ONE CAPSULE BY MOUTH AT BEDTIME AS NEEDED FOR SLEEP 30 capsule 0    No results found for this or any previous visit (from the past 48 hour(s)). No results found.  ROS  Blood pressure 131/94, pulse 94, temperature 97.8 F (36.6 C), temperature source Oral, resp. rate 16, height 5\' 5"  (1.651 m), weight 170 lb (77.111 kg), SpO2 100 %. Physical Exam  Constitutional: She appears well-developed and well-nourished.  HENT:  Mouth/Throat: Oropharynx is clear and moist.  Eyes: Conjunctivae are normal. No scleral icterus.  Neck: No thyromegaly present.  Cardiovascular: Normal rate, regular rhythm and normal heart sounds.   No murmur heard. Respiratory: Effort normal and breath sounds normal.  GI: Soft. She exhibits no distension and no mass. There is no tenderness.  Musculoskeletal: She exhibits no edema.  Lymphadenopathy:    She has no cervical adenopathy.  Neurological: She is alert.  Skin: Skin is warm and dry.     Assessment/Plan  Postprandial epigastric pain and bloating.  Diagnostic EGD.  REHMAN,NAJEEB U 06/01/2015, 4:33 PM

## 2015-06-01 NOTE — Discharge Instructions (Signed)
Resume usual medications and diet.  Dicyclomine 10 mg by mouth 30 minutes before each meal.  No driving for 24 hours.  Physician will call with biopsy results and further recommendations.  Esophagogastroduodenoscopy Care After Refer to this sheet in the next few weeks. These instructions provide you with information on caring for yourself after your procedure. Your caregiver may also give you more specific instructions. Your treatment has been planned according to current medical practices, but problems sometimes occur. Call your caregiver if you have any problems or questions after your procedure.  HOME CARE INSTRUCTIONS  Do not eat or drink anything until the numbing medicine (local anesthetic) has worn off and your gag reflex has returned. You will know that the local anesthetic has worn off when you can swallow comfortably.  Do not drive for 12 hours after the procedure or as directed by your caregiver.  Only take medicines as directed by your caregiver. SEEK MEDICAL CARE IF:   You cannot stop coughing.  You are not urinating at all or less than usual. SEEK IMMEDIATE MEDICAL CARE IF:  You have difficulty swallowing.  You cannot eat or drink.  You have worsening throat or chest pain.  You have dizziness, lightheadedness, or you faint.  You have nausea or vomiting.  You have chills.  You have a fever.  You have severe abdominal pain.  You have black, tarry, or bloody stools. Document Released: 10/28/2012 Document Reviewed: 10/28/2012 [facility] Patient Information 2015 [place]. This information is not intended to replace advice given to you by your health care provider. Make sure you discuss any questions you have with your health care provider.

## 2015-06-05 ENCOUNTER — Encounter (HOSPITAL_COMMUNITY): Payer: Self-pay | Admitting: Internal Medicine

## 2015-06-05 ENCOUNTER — Telehealth (INDEPENDENT_AMBULATORY_CARE_PROVIDER_SITE_OTHER): Payer: Self-pay | Admitting: *Deleted

## 2015-06-05 NOTE — Telephone Encounter (Signed)
Devonda said her reflux is a lot worse and discomfort in her chest since she started the Bentyl. The return phone number is [phone]. At [facility] now if the call can be returned.

## 2015-06-07 ENCOUNTER — Telehealth (INDEPENDENT_AMBULATORY_CARE_PROVIDER_SITE_OTHER): Payer: Self-pay | Admitting: *Deleted

## 2015-06-07 NOTE — Telephone Encounter (Signed)
Called patient about scheduling breath test -- she wants to know if this can be treated with antibiotic and diet change, if so can she try that first before scheduling test, also her protonix bid doesn't seem to be working, should she change that to something different, please advise

## 2015-06-08 NOTE — Telephone Encounter (Signed)
She has some bloating. She says she has tried Dexilant (samples) in the past and they have helped. Will leave 3 boxes of Dexilant at the front

## 2015-06-12 ENCOUNTER — Other Ambulatory Visit (INDEPENDENT_AMBULATORY_CARE_PROVIDER_SITE_OTHER): Payer: Self-pay | Admitting: *Deleted

## 2015-06-12 ENCOUNTER — Encounter (INDEPENDENT_AMBULATORY_CARE_PROVIDER_SITE_OTHER): Payer: Self-pay | Admitting: *Deleted

## 2015-06-12 ENCOUNTER — Other Ambulatory Visit (INDEPENDENT_AMBULATORY_CARE_PROVIDER_SITE_OTHER): Payer: Self-pay | Admitting: Internal Medicine

## 2015-06-12 DIAGNOSIS — G8929 Other chronic pain: Secondary | ICD-10-CM

## 2015-06-12 DIAGNOSIS — R1013 Epigastric pain: Secondary | ICD-10-CM

## 2015-06-12 DIAGNOSIS — R140 Abdominal distension (gaseous): Secondary | ICD-10-CM

## 2015-06-12 MED ORDER — METRONIDAZOLE 500 MG PO TABS: 500.0000 mg | ORAL_TABLET | Freq: Three times a day (TID) | ORAL | Status: DC

## 2015-06-12 NOTE — Telephone Encounter (Signed)
Patient left message this morning to go ahead and schedule breath test, she is scheduled for 06/16/15.

## 2015-06-12 NOTE — Telephone Encounter (Signed)
Talked with patient [name] empirically treat her with metronidazole 500 mg by mouth 3 times a day for 10 days and hold off doing breath test for now. She [name] call with progress report when she finishes therapy. She does not feel any better [name] consider surgical consultation has symptoms may be due to gallbladder disease despite negative studies.

## 2015-06-13 NOTE — Addendum Note (Signed)
Addended by: Grayland Ormond on: 06/13/2015 11:03 AM   Modules accepted: Medications

## 2015-06-13 NOTE — Telephone Encounter (Signed)
Breath test canceled for Friday

## 2015-06-16 ENCOUNTER — Encounter (HOSPITAL_COMMUNITY): Admission: RE | Payer: Self-pay | Source: Ambulatory Visit

## 2015-06-16 ENCOUNTER — Ambulatory Visit (HOSPITAL_COMMUNITY)
Admission: RE | Admit: 2015-06-16 | Payer: BLUE CROSS/BLUE SHIELD | Source: Ambulatory Visit | Admitting: Internal Medicine

## 2015-06-16 SURGERY — BREATH TEST, FOR INTESTINAL BACTERIAL OVERGROWTH

## 2015-06-23 ENCOUNTER — Encounter (INDEPENDENT_AMBULATORY_CARE_PROVIDER_SITE_OTHER): Payer: Self-pay | Admitting: Internal Medicine

## 2015-06-28 ENCOUNTER — Telehealth (INDEPENDENT_AMBULATORY_CARE_PROVIDER_SITE_OTHER): Payer: Self-pay | Admitting: *Deleted

## 2015-06-28 NOTE — Telephone Encounter (Signed)
Would recommend waiting two more weeks and then do breath test. Please call patient

## 2015-06-28 NOTE — Telephone Encounter (Signed)
Patient left message for PR:  She did feel better temporarily after she started the antibiotics. She has finished her 10 days of Flagyl. She is also watching what she is eating. She is still having fullness and pain under her right ribs after eating. Not every meal but enough to be bothersome. What are your recommendations? She can be reached at [phone]

## 2015-06-28 NOTE — Telephone Encounter (Signed)
Called patient with your recommendations and she wants to clarify with you, she thought she was going to be treated as if she had positive breath and if that didn't work then refer to surgeon, please advise

## 2015-06-28 NOTE — Telephone Encounter (Signed)
Talk with [name]. It appears metronidazole did not help her and symptomatic improvement maybe because she is watching what she eats. She will monitor her symptoms for the next one month and let us know. For now will hold off further testing or surgical consultation.

## 2015-07-18 ENCOUNTER — Other Ambulatory Visit: Payer: Self-pay | Admitting: Adult Health

## 2015-08-14 ENCOUNTER — Other Ambulatory Visit (HOSPITAL_COMMUNITY): Payer: Self-pay | Admitting: Pulmonary Disease

## 2015-08-14 ENCOUNTER — Ambulatory Visit (HOSPITAL_COMMUNITY)
Admission: RE | Admit: 2015-08-14 | Discharge: 2015-08-14 | Disposition: A | Discharge status: Home / Self Care | Payer: BLUE CROSS/BLUE SHIELD | Source: Ambulatory Visit | Attending: Pulmonary Disease | Admitting: Pulmonary Disease

## 2015-08-14 DIAGNOSIS — M159 Polyosteoarthritis, unspecified: Secondary | ICD-10-CM

## 2015-08-14 DIAGNOSIS — M8588 Other specified disorders of bone density and structure, other site: Secondary | ICD-10-CM | POA: Diagnosis not present

## 2015-08-14 DIAGNOSIS — M5412 Radiculopathy, cervical region: Secondary | ICD-10-CM

## 2015-08-14 DIAGNOSIS — R200 Anesthesia of skin: Secondary | ICD-10-CM | POA: Diagnosis present

## 2015-08-22 ENCOUNTER — Ambulatory Visit (HOSPITAL_COMMUNITY)
Admission: RE | Admit: 2015-08-22 | Discharge: 2015-08-22 | Disposition: A | Discharge status: Home / Self Care | Payer: BLUE CROSS/BLUE SHIELD | Source: Ambulatory Visit | Attending: Orthopedic Surgery | Admitting: Orthopedic Surgery

## 2015-08-22 ENCOUNTER — Other Ambulatory Visit: Payer: Self-pay | Admitting: Orthopedic Surgery

## 2015-08-22 DIAGNOSIS — M1710 Unilateral primary osteoarthritis, unspecified knee: Secondary | ICD-10-CM

## 2015-08-22 DIAGNOSIS — M179 Osteoarthritis of knee, unspecified: Secondary | ICD-10-CM | POA: Diagnosis present

## 2015-08-22 DIAGNOSIS — Z96653 Presence of artificial knee joint, bilateral: Secondary | ICD-10-CM | POA: Diagnosis not present

## 2015-08-24 ENCOUNTER — Ambulatory Visit (INDEPENDENT_AMBULATORY_CARE_PROVIDER_SITE_OTHER): Payer: BLUE CROSS/BLUE SHIELD | Admitting: Orthopedic Surgery

## 2015-08-24 VITALS — BP 122/85 | Ht 65.0 in | Wt 174.2 lb

## 2015-08-24 DIAGNOSIS — M48061 Spinal stenosis, lumbar region without neurogenic claudication: Secondary | ICD-10-CM

## 2015-08-24 DIAGNOSIS — M4806 Spinal stenosis, lumbar region: Secondary | ICD-10-CM

## 2015-08-24 DIAGNOSIS — Z96659 Presence of unspecified artificial knee joint: Secondary | ICD-10-CM

## 2015-08-24 DIAGNOSIS — M5020 Other cervical disc displacement, unspecified cervical region: Secondary | ICD-10-CM | POA: Diagnosis not present

## 2015-08-24 NOTE — Progress Notes (Signed)
Patient ID: [name], female   DOB: [birth year], 49 y.o.   MRN: [id number]  Post op annual TKA   Chief Complaint  Patient presents with  . Follow-up    Yearly Follow up Bilateral Knee Replacements    HPI [name] is a 49 y.o. female.   [name] comes in today with excellent function of her bilateral total knees. She is having no point at this point  She is planning of intermittent lower back pain mild with bilateral leg pain burning numbness and tingling but no giving way. She also complains of numbness in her left upper extremity. She has an x-ray which I reviewed which shows degenerative disc disease and she also has an MRI which I reviewed which shows a C5 C6 protruding disc     Past Medical History  Diagnosis Date  . Asthma     dxed at age 22  . Allergy   . Varicose veins   . Cataracts, bilateral   . Shortness of breath     SOB with exertion  . Depression   . Restless leg syndrome   . Migraine     last migraine:01/06/13  . Migraines 03/30/2013  . GERD (gastroesophageal reflux disease)   . Cystocele 10/29/2013  . Rectocele 10/29/2013  . Constipation 10/29/2013  . SUI (stress urinary incontinence, female)   . Hemorrhoid 01/12/2015    Past Surgical History  Procedure Laterality Date  . Septoplasty    . Tubal ligation    . Abdominal hysterectomy    . Replacement total knee bilateral    . Nissen fundoplication    . Endovenous ablation saphenous vein w/ laser  12-12-2011    left greater saphenous vein     . Colonoscopy  03/12/2012    Procedure: COLONOSCOPY;  Surgeon: Rogene Houston, MD;  [facility];  Service: Endoscopy;  Laterality: N/A;  1200  . Tonsillectomy    . Cataract extraction w/phaco  07/13/2012    Procedure: CATARACT EXTRACTION PHACO AND INTRAOCULAR LENS PLACEMENT (IOC);  Surgeon: Williams Che, MD;  Location: [facility];  Service: Ophthalmology;  Laterality: Right;  CDE:1.33  . Cataract extraction    . Cataract extraction w/phaco Left  01/11/2013    Procedure: CATARACT EXTRACTION PHACO AND INTRAOCULAR LENS PLACEMENT (IOC);  Surgeon: Williams Che, MD;  Location: [facility];  Service: Ophthalmology;  Laterality: Left;  CDE 0.00  . Cesarean section    . Bilateral oopherectomy      with posterior repair; perineoplasty  . Esophagogastroduodenoscopy N/A 06/01/2015    Procedure: ESOPHAGOGASTRODUODENOSCOPY (EGD);  Surgeon: Rogene Houston, MD;  [facility];  Service: Endoscopy;  Laterality: N/A;  255     Allergies  Allergen Reactions  . Hydrocodone Itching    Itching,   . Aminophylline Rash  . Nystatin Rash  . Penicillins Rash  . Sulfamethoxazole-Trimethoprim Rash    Pt has taken before but DS causes rash. Single strength does not    Current Outpatient Prescriptions  Medication Sig Dispense Refill  . amphetamine-dextroamphetamine (ADDERALL) 20 MG tablet Take 20 mg by mouth 2 (two) times daily.    . beclomethasone (QVAR) 80 MCG/ACT inhaler Inhale 2 puffs into the lungs 2 (two) times daily.    Marland Kitchen buPROPion (WELLBUTRIN SR) 150 MG 12 hr tablet Take 150 mg by mouth 2 (two) times daily.    Marland Kitchen EPIPEN 2-PAK 0.3 MG/0.3ML SOAJ injection as needed.   5  . fluticasone (FLONASE) 50 MCG/ACT nasal  spray Place 1 spray into the nose 2 (two) times daily.    . Fluticasone Furoate-Vilanterol (BREO ELLIPTA IN) Inhale 1 puff into the lungs daily.    . montelukast (SINGULAIR) 10 MG tablet Take 10 mg by mouth at bedtime.    . pantoprazole (PROTONIX) 40 MG tablet Take 1 tablet (40 mg total) by mouth daily. Take 30-60 min before first meal of the day (Patient taking differently: Take 40 mg by mouth 2 (two) times daily. Take 30-60 min before first meal of the day) 90 tablet 3  . rizatriptan (MAXALT) 10 MG tablet Take 10 mg by mouth as needed. May repeat in 2 hours if needed     . acetaminophen (TYLENOL) 500 MG tablet Take 1,000 mg by mouth every 6 (six) hours as needed. For pain    . albuterol (VENTOLIN HFA) 108 (90 BASE) MCG/ACT inhaler  Inhale 2 puffs into the lungs every 6 (six) hours as needed. For shortness of breath    . azelastine (ASTELIN) 137 MCG/SPRAY nasal spray 1 spray 2 (two) times daily.     Marland Kitchen dicyclomine (BENTYL) 10 MG capsule Take 1 capsule (10 mg total) by mouth 3 (three) times daily before meals. (Patient not taking: Reported on 08/24/2015) 90 capsule 1  . furosemide (LASIX) 40 MG tablet Take 40 mg by mouth as needed.   12  . metroNIDAZOLE (FLAGYL) 500 MG tablet Take 1 tablet (500 mg total) by mouth 3 (three) times daily. (Patient not taking: Reported on 08/24/2015) 30 tablet 0  . NATURE-THROID 32.5 MG tablet 32.5 mg daily.   0  . Probiotic Product (PROBIOTIC FORMULA PO) Take by mouth. Patient takes 2 billion units daily.    . RESTASIS 0.05 % ophthalmic emulsion Place 1 drop into both eyes as needed.   3  . temazepam (RESTORIL) 15 MG capsule TAKE ONE CAPSULE BY MOUTH AT BEDTIME AS NEEDED FOR SLEEP (Patient not taking: Reported on 08/24/2015) 30 capsule 2   No current facility-administered medications for this visit.    Review of Systems Review of Systems   Physical Exam Blood pressure 122/85, height 5\' 5"  (1.651 m), weight 174 lb 3.2 oz (79.017 kg).   Gen. appearance is normal there are no congenital abnormalities   The patient is oriented 3   Mood and affect are normal   Ambulation is without assistive device   Both Knees: inspection reveals a well-healed incision with no swelling   Knee flexion 120 bilaterally  Both knees Stability in the anteroposterior plane is normal as well as in the medial lateral plane  Both legs Motor exam reveals full extension without extensor lag   Data Reviewed KNEE XRAYS : Indicate stable total knees in both lower extremities  Assessment S/P bilateral TKA  Possible spinal stenosis C5-6 herniated disc  Plan    X-rays both knees one year Follow up with neurosurgery regarding cervical spine       Arther Abbott 08/24/2015, 4:51 PM

## 2015-09-14 ENCOUNTER — Other Ambulatory Visit: Payer: Self-pay | Admitting: Adult Health

## 2015-10-02 ENCOUNTER — Other Ambulatory Visit: Payer: Self-pay

## 2015-10-02 DIAGNOSIS — Z1231 Encounter for screening mammogram for malignant neoplasm of breast: Secondary | ICD-10-CM

## 2015-11-02 ENCOUNTER — Other Ambulatory Visit: Payer: Self-pay | Admitting: Obstetrics and Gynecology

## 2015-11-02 DIAGNOSIS — Z1231 Encounter for screening mammogram for malignant neoplasm of breast: Secondary | ICD-10-CM

## 2015-11-03 ENCOUNTER — Ambulatory Visit (HOSPITAL_COMMUNITY)
Admission: RE | Admit: 2015-11-03 | Discharge: 2015-11-03 | Disposition: A | Discharge status: Home / Self Care | Payer: BLUE CROSS/BLUE SHIELD | Source: Ambulatory Visit | Attending: Obstetrics and Gynecology | Admitting: Obstetrics and Gynecology

## 2015-11-03 DIAGNOSIS — Z1231 Encounter for screening mammogram for malignant neoplasm of breast: Secondary | ICD-10-CM | POA: Diagnosis present

## 2015-11-06 ENCOUNTER — Ambulatory Visit: Payer: BLUE CROSS/BLUE SHIELD

## 2015-11-13 ENCOUNTER — Telehealth: Payer: Self-pay | Admitting: *Deleted

## 2015-11-13 ENCOUNTER — Telehealth: Payer: Self-pay | Admitting: Obstetrics & Gynecology

## 2015-11-13 MED ORDER — OSPEMIFENE 60 MG PO TABS: 1.0000 | ORAL_TABLET | Freq: Every day | ORAL | Status: DC

## 2015-11-13 NOTE — Telephone Encounter (Signed)
Pt aware of Rx and ask for a 90 day supply instead of 30 day supply. 90 day supply sent to her pharmacy.

## 2015-11-13 NOTE — Addendum Note (Signed)
Addended by: Farley Ly on: 11/13/2015 03:25 PM   Modules accepted: Orders

## 2015-11-13 NOTE — Telephone Encounter (Signed)
Pt states that she has yearly appt in February. Pt states that she has tried a very low dose patch but it caused migraines. Pt wants to know if there is something that she can do or take to help with the hot flashes. Pt is also having pain with intercourse. Is there something you can give for this as well.

## 2016-01-15 ENCOUNTER — Other Ambulatory Visit: Payer: BLUE CROSS/BLUE SHIELD | Admitting: Adult Health

## 2016-02-17 IMAGING — US US ABDOMEN LIMITED
1 series · 14 of 25 positions shown · non-contrast
Comparison: None.

CLINICAL DATA: Nausea, vomiting, abdominal pain

EXAM:
US ABDOMEN LIMITED - RIGHT UPPER QUADRANT

[Series 1: us abdomen limited · 0.32mm/px · 14 of 37 slices shown]
[im 1/37]
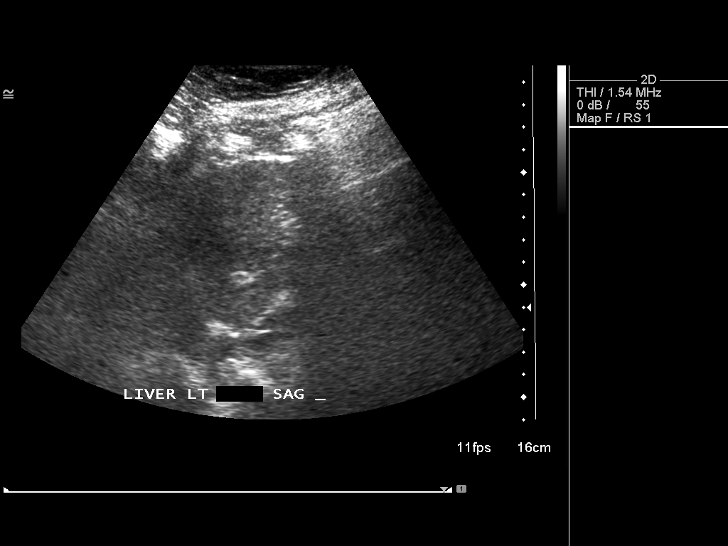
[im 4/37]
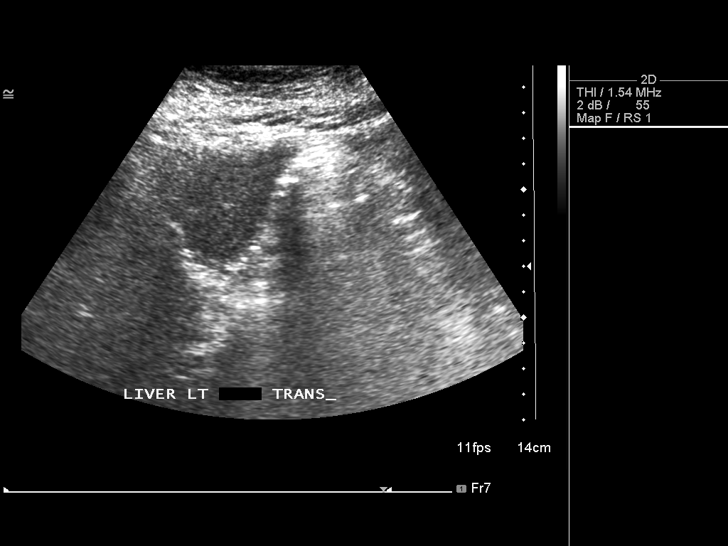
[im 7/37]
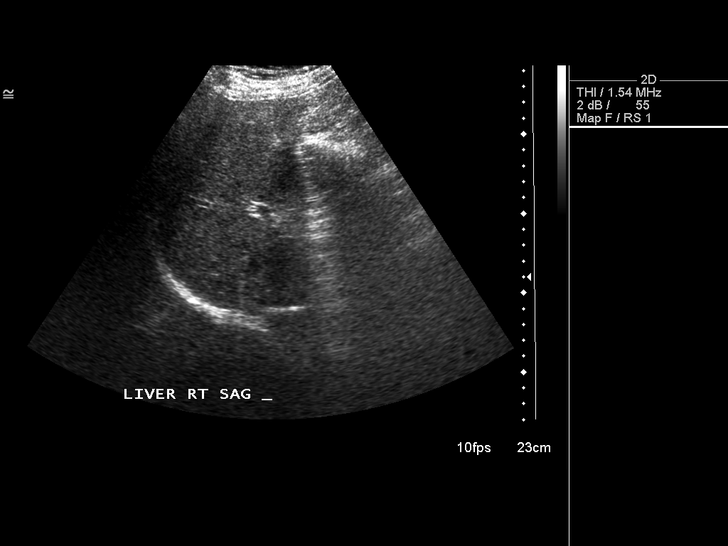
[im 10/37]
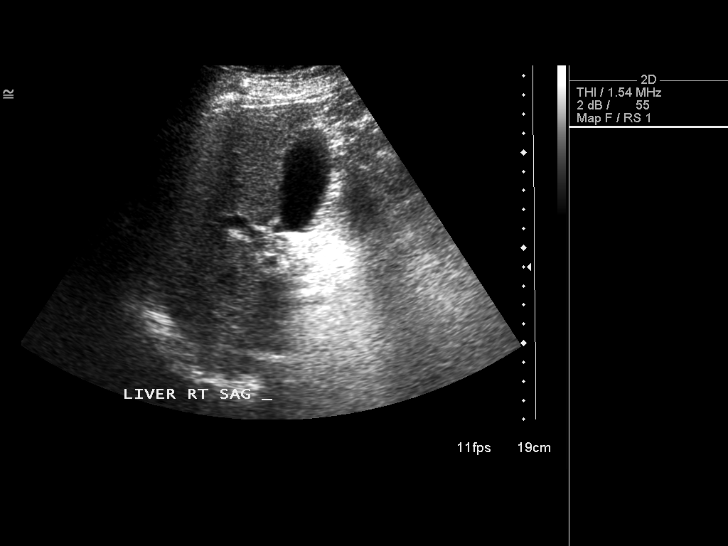
[im 13/37]
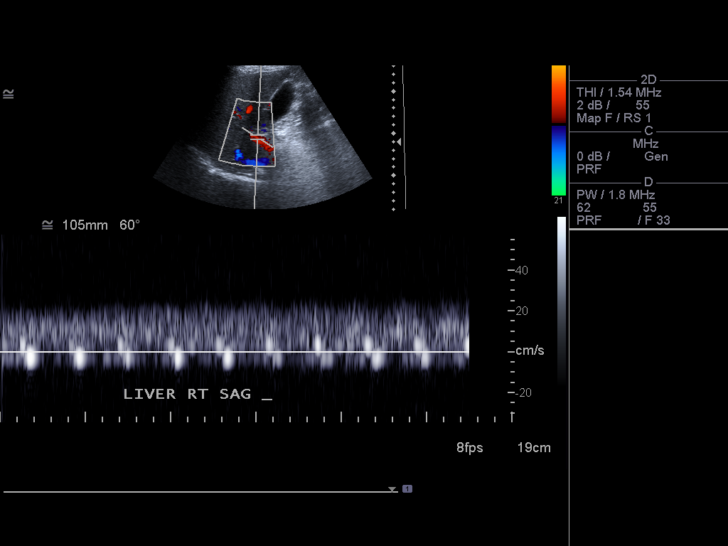
[im 14/37]
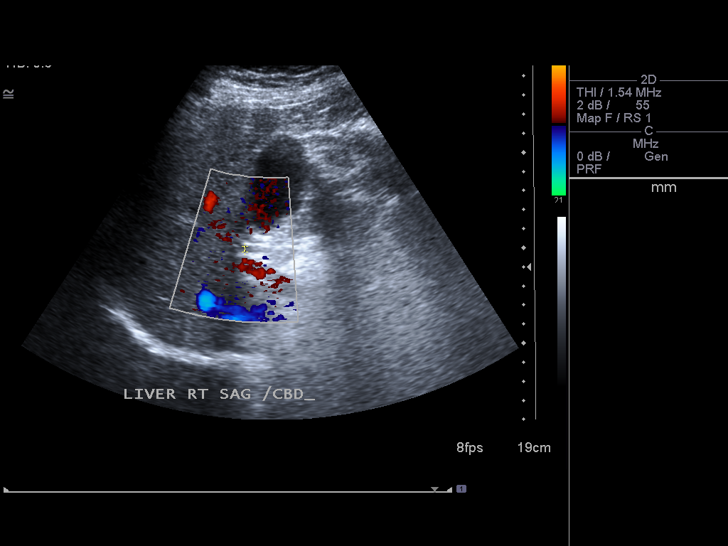
[im 17/37]
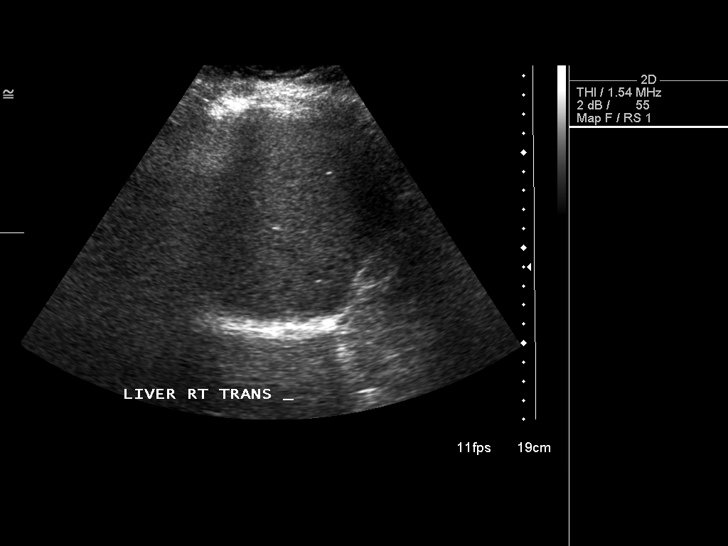
[im 20/37]
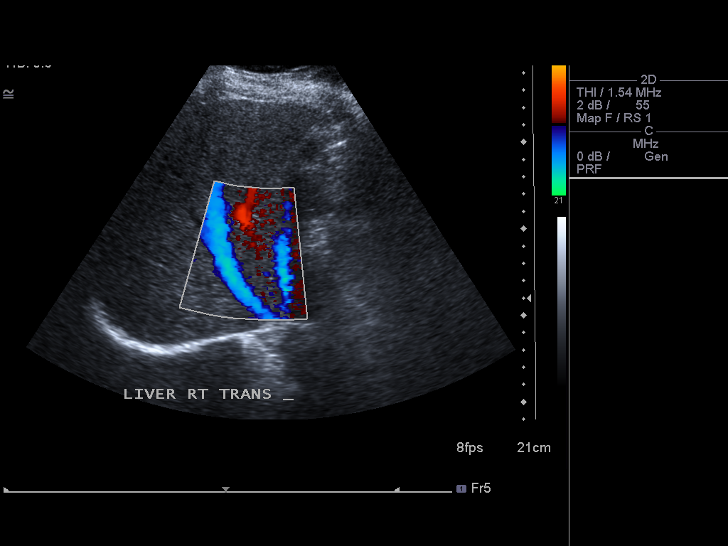
[im 23/37]
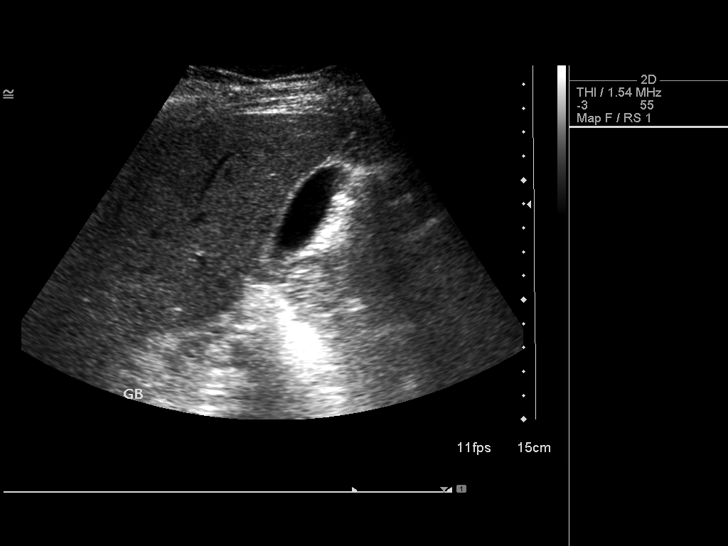
[im 25/37]
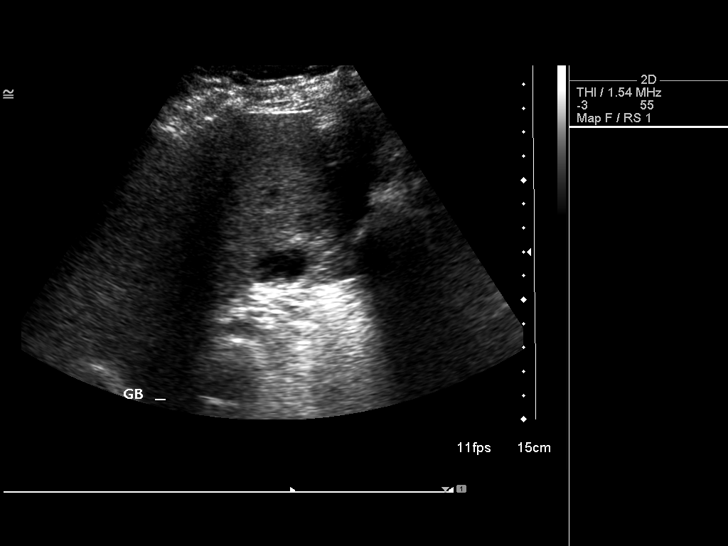
[im 28/37]
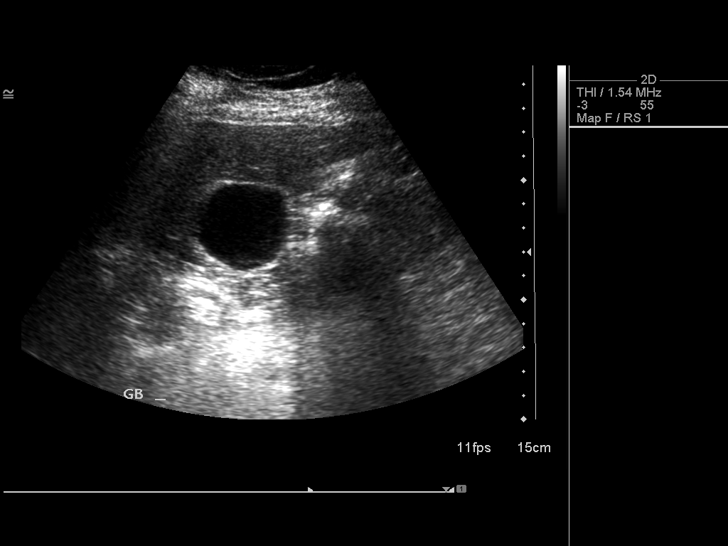
[im 31/37]
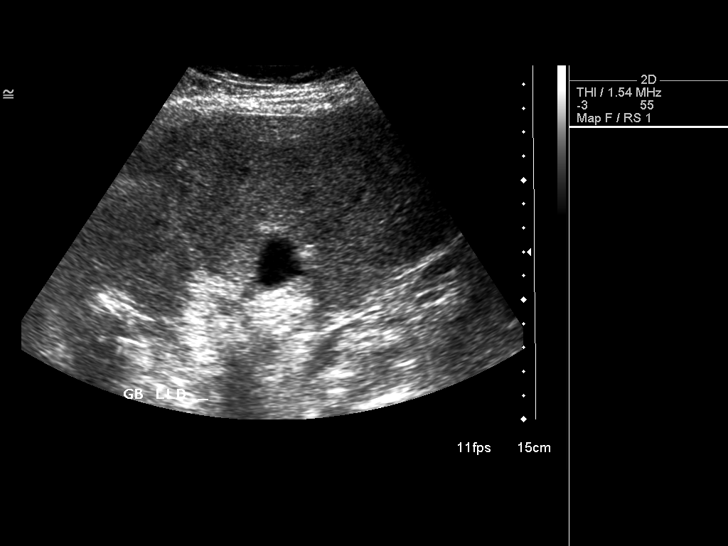
[im 34/37]
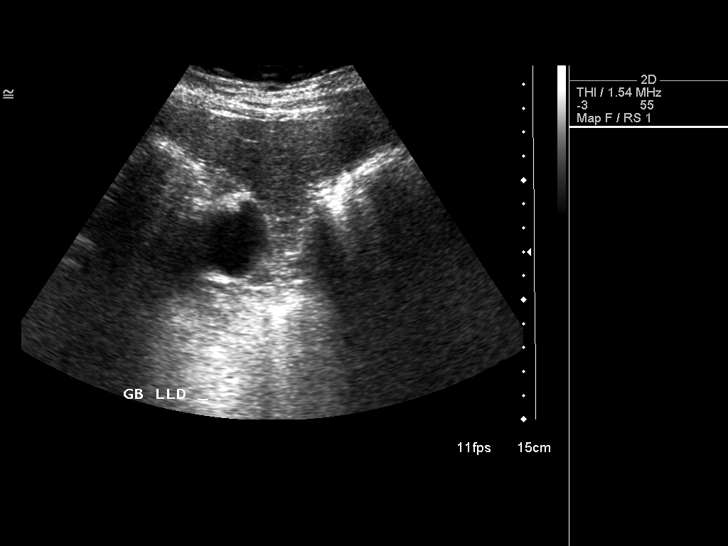
[im 37/37]
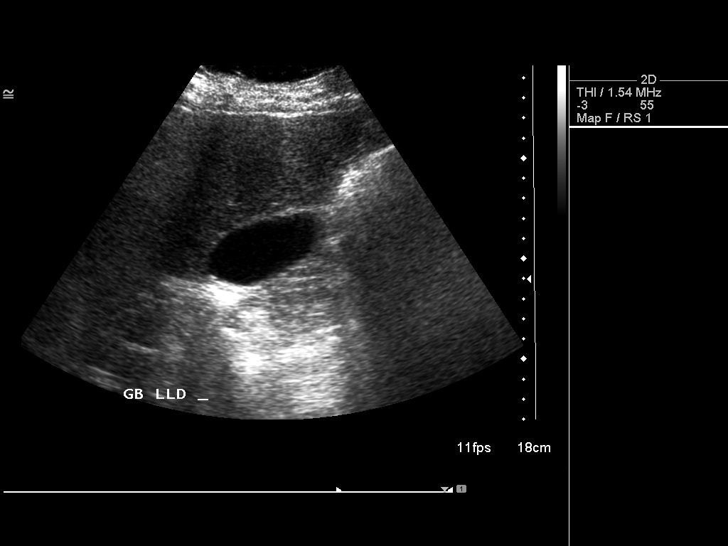

[14 of 25 positions shown; findings below may reference images not displayed]

FINDINGS: Gallbladder:

The gallbladder is visualized and no gallstones are noted. There is
no pain over the gallbladder with compression.

Common bile duct:

Diameter: The common bile duct is normal measuring 3 mm in diameter.

Liver:

The liver has a normal echogenic pattern. No focal hepatic
abnormality is seen. The liver measures 13.0 cm sagittally.
IMPRESSION: Negative ultrasound of the right upper quadrant.

## 2016-03-12 ENCOUNTER — Other Ambulatory Visit: Payer: Self-pay | Admitting: Adult Health

## 2016-04-05 ENCOUNTER — Ambulatory Visit (INDEPENDENT_AMBULATORY_CARE_PROVIDER_SITE_OTHER): Payer: 59 | Admitting: Adult Health

## 2016-04-05 ENCOUNTER — Encounter: Payer: Self-pay | Admitting: Adult Health

## 2016-04-05 VITALS — BP 120/90 | HR 98 | Ht 64.25 in | Wt 161.0 lb

## 2016-04-05 DIAGNOSIS — R102 Pelvic and perineal pain: Secondary | ICD-10-CM | POA: Diagnosis not present

## 2016-04-05 DIAGNOSIS — Z1211 Encounter for screening for malignant neoplasm of colon: Secondary | ICD-10-CM | POA: Diagnosis not present

## 2016-04-05 DIAGNOSIS — Z01411 Encounter for gynecological examination (general) (routine) with abnormal findings: Secondary | ICD-10-CM | POA: Diagnosis not present

## 2016-04-05 DIAGNOSIS — N898 Other specified noninflammatory disorders of vagina: Secondary | ICD-10-CM | POA: Diagnosis not present

## 2016-04-05 DIAGNOSIS — Z01419 Encounter for gynecological examination (general) (routine) without abnormal findings: Secondary | ICD-10-CM

## 2016-04-05 HISTORY — DX: Other specified noninflammatory disorders of vagina: N89.8

## 2016-04-05 HISTORY — DX: Pelvic and perineal pain: R10.2

## 2016-04-05 LAB — HEMOCCULT GUIAC POC 1CARD (OFFICE): FECAL OCCULT BLD: NEGATIVE

## 2016-04-05 MED ORDER — ESTROGENS, CONJUGATED 0.625 MG/GM VA CREA: 1.0000 | TOPICAL_CREAM | Freq: Every day | VAGINAL | Status: DC

## 2016-04-05 MED ORDER — VALACYCLOVIR HCL 1 G PO TABS: 1000.0000 mg | ORAL_TABLET | Freq: Two times a day (BID) | ORAL | Status: DC

## 2016-04-05 NOTE — Patient Instructions (Signed)
Use .5 gm premarin in vagina as directed Physical in 1 year Mammogram yearly Colonoscopy per [facility] with PCP

## 2016-04-05 NOTE — Progress Notes (Signed)
Patient ID: [name], female   DOB: July 29, 1966, 50 y.o.   MRN: [id number] History of Present Illness: [name] is a 50 year old white female, married, sp hysterectomy in for a well woman gyn exam.She is complaining of vaginal pain with sex, feels dry.She has lost about 10 lbs and she now working from home for [employer].She requests refill on valtrex today. PCP is Dr Luan Pulling.   Current Medications, Allergies, Past Medical History, Past Surgical History, Family History and Social History were reviewed in [employer] record.     Review of Systems: Patient denies any daily headaches, hearing loss, fatigue, blurred vision, shortness of breath, chest pain, abdominal pain, problems with bowel movements, urination. No joint pain or mood swings. See HPI for positives.    Physical Exam:BP 120/90 mmHg  Pulse 98  Ht 5' 4.25" (1.632 m)  Wt 161 lb (73.029 kg)  BMI 27.42 kg/m2 General:  Well developed, well nourished, no acute distress Skin:  Warm and dry,no rashes Neck:  Midline trachea, normal thyroid, good ROM, no lymphadenopathy Lungs; Clear to auscultation bilaterally Breast:  No dominant palpable mass, retraction, or nipple discharge Cardiovascular: Regular rate and rhythm Abdomen:  Soft, non tender, no hepatosplenomegaly Pelvic:  External genitalia is normal in appearance, no lesions.  The vagina has decreased color, moisture and rugae. Urethra has no lesions or masses. The cervix and uterus are absent.  No adnexal masses or tenderness noted.Bladder is non tender, no masses felt. Rectal: Good sphincter tone, no polyps, or hemorrhoids felt.  Hemoccult negative. Extremities/musculoskeletal:  No swelling or varicosities noted, no clubbing or cyanosis Psych:  No mood changes, alert and cooperative,seems happy   Impression: Well woman gyn exam no pap Vaginal pain Vaginal dryness     Plan: Given 12 gm premarin vaginal cream to try .5 gm in vagina every other night for 2  weeks then 1-2 x per week Refilled valtrex 1 gm #30 take 1 bid as needed with 3 refills Physical in 1 year Mammogram yearly Colonoscopy per [facility] with PCP, to see him today

## 2016-04-10 ENCOUNTER — Encounter: Payer: Self-pay | Admitting: Family Medicine

## 2016-05-13 ENCOUNTER — Other Ambulatory Visit: Payer: Self-pay | Admitting: Adult Health

## 2016-05-23 ENCOUNTER — Other Ambulatory Visit: Payer: Self-pay | Admitting: Adult Health

## 2016-07-04 ENCOUNTER — Telehealth: Payer: Self-pay | Admitting: Orthopedic Surgery

## 2016-07-04 ENCOUNTER — Other Ambulatory Visit: Payer: Self-pay | Admitting: *Deleted

## 2016-07-04 MED ORDER — CLINDAMYCIN HCL 300 MG PO CAPS: ORAL_CAPSULE | ORAL | 2 refills | Status: DC

## 2016-07-04 NOTE — Telephone Encounter (Signed)
ANTIBIOTIC SENT TO PHARMACY, PATIENT AWARE

## 2016-07-04 NOTE — Telephone Encounter (Signed)
Patient has a dental appointment this afternoon and wants to know if she needs to take any antibiotics before her appointment.  Please advise

## 2016-07-30 ENCOUNTER — Telehealth: Payer: Self-pay | Admitting: Adult Health

## 2016-07-30 NOTE — Telephone Encounter (Signed)
Left message letting pt know she would need to be seen for possible BV. Advised to call and schedule an appt. [place]

## 2016-07-31 MED ORDER — METRONIDAZOLE 500 MG PO TABS: 500.0000 mg | ORAL_TABLET | Freq: Two times a day (BID) | ORAL | 0 refills | Status: DC

## 2016-07-31 MED ORDER — FLUCONAZOLE 150 MG PO TABS: ORAL_TABLET | ORAL | 2 refills | Status: DC

## 2016-07-31 NOTE — Telephone Encounter (Signed)
[name] complains of BV and wants flagyl, also needs refill on diflucan, will sent to [facility]

## 2016-08-22 ENCOUNTER — Ambulatory Visit: Payer: BLUE CROSS/BLUE SHIELD | Admitting: Orthopedic Surgery

## 2016-08-27 ENCOUNTER — Ambulatory Visit: Payer: 59

## 2016-08-27 ENCOUNTER — Ambulatory Visit (INDEPENDENT_AMBULATORY_CARE_PROVIDER_SITE_OTHER): Payer: 59 | Admitting: Orthopedic Surgery

## 2016-08-27 ENCOUNTER — Ambulatory Visit (INDEPENDENT_AMBULATORY_CARE_PROVIDER_SITE_OTHER): Payer: 59

## 2016-08-27 ENCOUNTER — Encounter: Payer: Self-pay | Admitting: Orthopedic Surgery

## 2016-08-27 VITALS — BP 118/82 | HR 109 | Wt 173.0 lb

## 2016-08-27 DIAGNOSIS — Z96653 Presence of artificial knee joint, bilateral: Secondary | ICD-10-CM | POA: Diagnosis not present

## 2016-08-27 DIAGNOSIS — M1710 Unilateral primary osteoarthritis, unspecified knee: Secondary | ICD-10-CM

## 2016-08-27 NOTE — Progress Notes (Signed)
Patient ID: [name], female   DOB: 01/30/1966, 50 y.o.   MRN: [id number]  Chief Complaint  Patient presents with  . Follow-up    annual xrays, bilateral TKA,    Surgery 2007  HPI [name] is a 50 y.o. female.  Presents for reevaluation bilateral total knees annual follow-up x-rays and reexamination.  Patient has no complaints.    Review of Systems Review of Systems 1. Negative for back pain 2. Negative for neurologic symptoms numbness tingling   Past Medical History:  Diagnosis Date  . Allergy   . Asthma    dxed at age 72  . Cataracts, bilateral   . Constipation 10/29/2013  . Cystocele 10/29/2013  . Depression   . GERD (gastroesophageal reflux disease)   . Hemorrhoid 01/12/2015  . Herpes simplex virus (HSV) infection   . Migraine    last migraine:01/06/13  . Migraines 03/30/2013  . Rectocele 10/29/2013  . Restless leg syndrome   . Shortness of breath    SOB with exertion  . SUI (stress urinary incontinence, female)   . Vaginal dryness 04/05/2016  . Vaginal pain 04/05/2016  . Varicose veins     Past Surgical History:  Procedure Laterality Date  . ABDOMINAL HYSTERECTOMY    . BACK SURGERY     C5-6 decompression and fusion  . bilateral oopherectomy     with posterior repair; perineoplasty  . CATARACT EXTRACTION    . CATARACT EXTRACTION W/PHACO  07/13/2012   Procedure: CATARACT EXTRACTION PHACO AND INTRAOCULAR LENS PLACEMENT (IOC);  Surgeon: Williams Che, MD;  Location: [facility];  Service: Ophthalmology;  Laterality: Right;  CDE:1.33  . CATARACT EXTRACTION W/PHACO Left 01/11/2013   Procedure: CATARACT EXTRACTION PHACO AND INTRAOCULAR LENS PLACEMENT (IOC);  Surgeon: Williams Che, MD;  Location: [facility];  Service: Ophthalmology;  Laterality: Left;  CDE 0.00  . CESAREAN SECTION    . COLONOSCOPY  03/12/2012   Procedure: COLONOSCOPY;  Surgeon: Rogene Houston, MD;  Location: [facility];  Service: Endoscopy;  Laterality: N/A;  1200  . ENDOVENOUS ABLATION  SAPHENOUS VEIN W/ LASER  12-12-2011   left greater saphenous vein     . ESOPHAGOGASTRODUODENOSCOPY N/A 06/01/2015   Procedure: ESOPHAGOGASTRODUODENOSCOPY (EGD);  Surgeon: Rogene Houston, MD;  Location: [facility];  Service: Endoscopy;  Laterality: N/A;  255  . NISSEN FUNDOPLICATION    . REPLACEMENT TOTAL KNEE BILATERAL    . SEPTOPLASTY    . TONSILLECTOMY    . TUBAL LIGATION      Social History Social History  Substance Use Topics  . Smoking status: Former Smoker    Packs/day: 0.25    Years: 2.00    Types: Cigarettes    Quit date: 07/23/1981  . Smokeless tobacco: Never Used  . Alcohol use Yes     Comment: occassional glass of wine or beer    Allergies  Allergen Reactions  . Hydrocodone Itching    Itching,   . Aminophylline Rash  . Nystatin Rash  . Penicillins Rash  . Sulfamethoxazole-Trimethoprim Rash    Pt has taken before but DS causes rash. Single strength does not    Current Meds  Medication Sig  . albuterol (VENTOLIN HFA) 108 (90 BASE) MCG/ACT inhaler Inhale 2 puffs into the lungs every 6 (six) hours as needed. For shortness of breath  . amphetamine-dextroamphetamine (ADDERALL) 20 MG tablet Take 20 mg by mouth 2 (two) times daily.  [name] azelastine (ASTELIN) 137 MCG/SPRAY nasal spray 1 spray 2 (  two) times daily.   . beclomethasone (QVAR) 80 MCG/ACT inhaler Inhale 2 puffs into the lungs 2 (two) times daily.  [name] buPROPion (WELLBUTRIN SR) 150 MG 12 hr tablet Take 150 mg by mouth 2 (two) times daily.  . clindamycin (CLEOCIN) 300 MG capsule TAKE TWO CAPS BY MOUTH 30 MINUTES PRIOR TO DENTAL PROCEDURE  . conjugated estrogens (PREMARIN) vaginal cream Place 1 Applicatorful vaginally daily.  . Cyanocobalamin (VITAMIN B-12 PO) Take by mouth daily.  [name] EPIPEN 2-PAK 0.3 MG/0.3ML SOAJ injection as needed.   . fluticasone (FLONASE) 50 MCG/ACT nasal spray Place 1 spray into the nose 2 (two) times daily.  . Fluticasone Furoate-Vilanterol (BREO ELLIPTA IN) Inhale 1 puff into the lungs  daily.  . furosemide (LASIX) 40 MG tablet Take 40 mg by mouth as needed.   . montelukast (SINGULAIR) 10 MG tablet Take 10 mg by mouth at bedtime.  . pantoprazole (PROTONIX) 40 MG tablet Take 1 tablet (40 mg total) by mouth daily. Take 30-60 min before first meal of the day (Patient taking differently: Take 40 mg by mouth 2 (two) times daily. Take 30-60 min before first meal of the day)  . RESTASIS 0.05 % ophthalmic emulsion Place 1 drop into both eyes as needed.   . rizatriptan (MAXALT) 10 MG tablet Take 10 mg by mouth as needed. May repeat in 2 hours if needed   . temazepam (RESTORIL) 15 MG capsule TAKE ONE CAPSULE BY MOUTH AT BEDTIME AS NEEDED FOR SLEEP  . valACYclovir (VALTREX) 1000 MG tablet Take 1 tablet (1,000 mg total) by mouth 2 (two) times daily.      Physical Exam Physical Exam BP 118/82   Pulse (!) 109   Wt 173 lb (78.5 kg)   BMI 29.46 kg/m   Gen. appearance. The patient is well-developed and well-nourished, grooming and hygiene are normal. There are no gross congenital abnormalities  The patient is alert and oriented to person place and time  Mood and affect are normal  Ambulation Normal without assistive devices  Examination reveals the following: On inspection we find right knee flexion 125 full extension. Stability tests normal and sagittal and coronal plane strength tests revealed grade 5 motor strength skin incision clean dry intact no neuroma sensation intact right leg normal pulse distally  On inspection of the left knee we find equal flexion of 125 full extension normal strength and stability skin normal sensation normal and normal pulses with no edema  Data Reviewed X-ray right knee stable total knee see reports  X-ray left knee stable total knee see report  Assessment  Encounter Diagnoses  Name Primary?  [name] Arthritis of knee   . Status post total bilateral knee replacement Yes       Stable postop total knees at 10 years    Plan    The patient  has opted out of annual exams and has opted out of antibiotics so no follow-up basis       [name] 08/27/2016, 9:49 AM

## 2016-09-09 ENCOUNTER — Telehealth: Payer: Self-pay | Admitting: Adult Health

## 2016-09-09 NOTE — Telephone Encounter (Signed)
Wants to get on hormones, hot sweats, weight gain and moody, does not want to do anything.using vaginal cream, made appt  with Dr Elonda Husky 10/20 at 9 am to discuss

## 2016-09-09 NOTE — Telephone Encounter (Signed)
Left message I called 

## 2016-09-09 NOTE — Telephone Encounter (Signed)
Pt called stating that she Just missed her phone call and is calling back. Please contact pt

## 2016-09-09 NOTE — Telephone Encounter (Signed)
JAG spoke with pt. Encounter closed. [place]

## 2016-09-11 ENCOUNTER — Other Ambulatory Visit: Payer: Self-pay | Admitting: Adult Health

## 2016-09-13 ENCOUNTER — Encounter: Payer: Self-pay | Admitting: Obstetrics & Gynecology

## 2016-09-13 ENCOUNTER — Ambulatory Visit (INDEPENDENT_AMBULATORY_CARE_PROVIDER_SITE_OTHER): Payer: 59 | Admitting: Obstetrics & Gynecology

## 2016-09-13 VITALS — BP 130/80 | HR 76 | Wt 176.0 lb

## 2016-09-13 DIAGNOSIS — N951 Menopausal and female climacteric states: Secondary | ICD-10-CM | POA: Diagnosis not present

## 2016-09-13 DIAGNOSIS — N898 Other specified noninflammatory disorders of vagina: Secondary | ICD-10-CM | POA: Diagnosis not present

## 2016-09-13 MED ORDER — ESTRADIOL 0.025 MG/24HR TD PTTW: 1.0000 | MEDICATED_PATCH | TRANSDERMAL | 12 refills | Status: DC

## 2016-09-13 NOTE — Progress Notes (Signed)
Chief Complaint  Patient presents with  . discuss hormones    Blood pressure 130/80, pulse 76, weight 176 lb (79.8 kg).  50 y.o. G2P2 No LMP recorded. Patient has had a hysterectomy. The current method of family planning is status post hysterectomy.  Outpatient Encounter Prescriptions as of 09/13/2016  Medication Sig Note  . amphetamine-dextroamphetamine (ADDERALL) 20 MG tablet Take 20 mg by mouth 2 (two) times daily.   Marland Kitchen azelastine (ASTELIN) 137 MCG/SPRAY nasal spray 1 spray 2 (two) times daily.    . beclomethasone (QVAR) 80 MCG/ACT inhaler Inhale 2 puffs into the lungs 2 (two) times daily.   Marland Kitchen BLACK COHOSH EXTRACT PO Take by mouth.   Marland Kitchen buPROPion (WELLBUTRIN SR) 150 MG 12 hr tablet Take 150 mg by mouth 2 (two) times daily.   Marland Kitchen conjugated estrogens (PREMARIN) vaginal cream Place 1 Applicatorful vaginally daily.   . Cyanocobalamin (VITAMIN B-12 PO) Take by mouth daily.   . fluticasone (FLONASE) 50 MCG/ACT nasal spray Place 1 spray into the nose 2 (two) times daily.   . Fluticasone Furoate-Vilanterol (BREO ELLIPTA IN) Inhale 1 puff into the lungs daily.   . furosemide (LASIX) 40 MG tablet Take 40 mg by mouth as needed.  01/12/2015: Received from: External Pharmacy  . montelukast (SINGULAIR) 10 MG tablet Take 10 mg by mouth at bedtime.   . RESTASIS 0.05 % ophthalmic emulsion Place 1 drop into both eyes as needed.  01/12/2015: Received from: External Pharmacy  . rizatriptan (MAXALT) 10 MG tablet Take 10 mg by mouth as needed. May repeat in 2 hours if needed    . temazepam (RESTORIL) 15 MG capsule TAKE ONE CAPSULE BY MOUTH AT BEDTIME AS NEEDED FOR SLEEP   . albuterol (VENTOLIN HFA) 108 (90 BASE) MCG/ACT inhaler Inhale 2 puffs into the lungs every 6 (six) hours as needed. For shortness of breath   . EPIPEN 2-PAK 0.3 MG/0.3ML SOAJ injection as needed.  01/12/2015: Received from: External Pharmacy  . estradiol (VIVELLE-DOT) 0.025 MG/24HR Place 1 patch onto the skin 2 (two) times a week.     . fluconazole (DIFLUCAN) 150 MG tablet TAKE ONE TABLET BY MOUTH NOW REPEAT  DOSE  IN  3  DAYS (Patient not taking: Reported on 09/13/2016)   . pantoprazole (PROTONIX) 40 MG tablet Take 1 tablet (40 mg total) by mouth daily. Take 30-60 min before first meal of the day (Patient taking differently: Take 40 mg by mouth 2 (two) times daily. Take 30-60 min before first meal of the day)   . valACYclovir (VALTREX) 1000 MG tablet Take 1 tablet (1,000 mg total) by mouth 2 (two) times daily. (Patient not taking: Reported on 09/13/2016)   . [DISCONTINUED] clindamycin (CLEOCIN) 300 MG capsule TAKE TWO CAPS BY MOUTH 30 MINUTES PRIOR TO DENTAL PROCEDURE   . [DISCONTINUED] metroNIDAZOLE (FLAGYL) 500 MG tablet Take 1 tablet (500 mg total) by mouth 2 (two) times daily. (Patient not taking: Reported on 08/27/2016)    No facility-administered encounter medications on file as of 09/13/2016.     Subjective Pt is in due to significant worsening of her vasomotor symptoms, to the point she can't really tolerate it Sleep is disturbed and occurs multiple times daily Also with burning irritation of the vagina  Objective   Pertinent ROS No burning with urination, frequency or urgency No nausea, vomiting or diarrhea Nor fever chills or other constitutional symptoms   Labs or studies     Impression Diagnoses this Encounter::   ICD-9-CM  ICD-10-CM   1. Menopausal symptoms 627.2 N95.1    has problems with estrogens in the past with ERT, symptoms so bad wants to try again  2. Vaginal dryness 625.8 N89.8     Established relevant diagnosis(es): menopause  Plan/Recommendations: Meds ordered this encounter  Medications  . BLACK COHOSH EXTRACT PO    Sig: Take by mouth.  . estradiol (VIVELLE-DOT) 0.025 MG/24HR    Sig: Place 1 patch onto the skin 2 (two) times a week.    Dispense:  8 patch    Refill:  12    Labs or Scans Ordered: No orders of the defined types were placed in this  encounter.   Management:: Begin low dose vivelle dot patch and see if symptotms improve, can increase if needed  Follow up Return if symptoms worsen or fail to improve.        Face to face time:  15 minutes  Greater than 50% of the visit time was spent in counseling and coordination of care with the patient.  The summary and outline of the counseling and care coordination is summarized in the note above.   All questions were answered.  Past Medical History:  Diagnosis Date  . Allergy   . Asthma    dxed at age 41  . Cataracts, bilateral   . Constipation 10/29/2013  . Cystocele 10/29/2013  . Depression   . GERD (gastroesophageal reflux disease)   . Hemorrhoid 01/12/2015  . Herpes simplex virus (HSV) infection   . Migraine    last migraine:01/06/13  . Migraines 03/30/2013  . Rectocele 10/29/2013  . Restless leg syndrome   . Shortness of breath    SOB with exertion  . SUI (stress urinary incontinence, female)   . Vaginal dryness 04/05/2016  . Vaginal pain 04/05/2016  . Varicose veins     Past Surgical History:  Procedure Laterality Date  . ABDOMINAL HYSTERECTOMY    . BACK SURGERY     C5-6 decompression and fusion  . bilateral oopherectomy     with posterior repair; perineoplasty  . CATARACT EXTRACTION    . CATARACT EXTRACTION W/PHACO  07/13/2012   Procedure: CATARACT EXTRACTION PHACO AND INTRAOCULAR LENS PLACEMENT (IOC);  Surgeon: Williams Che, MD;  Location: [facility];  Service: Ophthalmology;  Laterality: Right;  CDE:1.33  . CATARACT EXTRACTION W/PHACO Left 01/11/2013   Procedure: CATARACT EXTRACTION PHACO AND INTRAOCULAR LENS PLACEMENT (IOC);  Surgeon: Williams Che, MD;  Location: [facility];  Service: Ophthalmology;  Laterality: Left;  CDE 0.00  . CESAREAN SECTION    . COLONOSCOPY  03/12/2012   Procedure: COLONOSCOPY;  Surgeon: Rogene Houston, MD;  [facility];  Service: Endoscopy;  Laterality: N/A;  1200  . ENDOVENOUS ABLATION SAPHENOUS VEIN W/ LASER   12-12-2011   left greater saphenous vein     . ESOPHAGOGASTRODUODENOSCOPY N/A 06/01/2015   Procedure: ESOPHAGOGASTRODUODENOSCOPY (EGD);  Surgeon: Rogene Houston, MD;  [facility];  Service: Endoscopy;  Laterality: N/A;  255  . NISSEN FUNDOPLICATION    . REPLACEMENT TOTAL KNEE BILATERAL    . SEPTOPLASTY    . TONSILLECTOMY    . TUBAL LIGATION      OB History    Gravida Para Term Preterm AB Living   2 2       2    SAB TAB Ectopic Multiple Live Births           2      Allergies  Allergen Reactions  .  Hydrocodone Itching    Itching,   . Aminophylline Rash  . Nystatin Rash  . Penicillins Rash  . Sulfamethoxazole-Trimethoprim Rash    Pt has taken before but DS causes rash. Single strength does not    Social History   Social History  . Marital status: Married    Spouse name: N/A  . Number of children: N/A  . Years of education: N/A   Occupational History  . RN [employer]   Social History Main Topics  . Smoking status: Former Smoker    Packs/day: 0.25    Years: 2.00    Types: Cigarettes    Quit date: 07/23/1981  . Smokeless tobacco: Never Used  . Alcohol use Yes     Comment: occassional glass of wine or beer  . Drug use: No  . Sexual activity: Yes    Birth control/ protection: Surgical     Comment: hyst   Other Topics Concern  . None   Social History Narrative  . None    Family History  Problem Relation Age of Onset  . Diabetes Mother   . Depression Mother   . Diabetes type II Mother   . Migraines Sister   . Other Sister     migranes  . Depression Sister   . Fibromyalgia Sister   . Hypertension Sister   . Hypertension Father   . Heart failure Father   . Asthma Maternal Grandmother   . Hypertension Brother   . Cancer Paternal Grandmother   . Asthma Child

## 2016-10-22 ENCOUNTER — Other Ambulatory Visit (HOSPITAL_COMMUNITY): Payer: Self-pay | Admitting: Pulmonary Disease

## 2016-10-22 DIAGNOSIS — Z1231 Encounter for screening mammogram for malignant neoplasm of breast: Secondary | ICD-10-CM

## 2016-10-28 ENCOUNTER — Other Ambulatory Visit: Payer: Self-pay | Admitting: *Deleted

## 2016-10-28 ENCOUNTER — Telehealth: Payer: Self-pay | Admitting: *Deleted

## 2016-10-28 MED ORDER — ESTRADIOL 0.025 MG/24HR TD PTTW: 1.0000 | MEDICATED_PATCH | TRANSDERMAL | 3 refills | Status: DC

## 2016-10-28 NOTE — Telephone Encounter (Signed)
Refilled patch 

## 2016-11-06 ENCOUNTER — Ambulatory Visit (HOSPITAL_COMMUNITY)
Admission: RE | Admit: 2016-11-06 | Discharge: 2016-11-06 | Disposition: A | Discharge status: Home / Self Care | Payer: 59 | Source: Ambulatory Visit | Attending: Pulmonary Disease | Admitting: Pulmonary Disease

## 2016-11-06 DIAGNOSIS — Z1231 Encounter for screening mammogram for malignant neoplasm of breast: Secondary | ICD-10-CM | POA: Diagnosis not present

## 2016-11-07 ENCOUNTER — Ambulatory Visit (INDEPENDENT_AMBULATORY_CARE_PROVIDER_SITE_OTHER): Payer: 59 | Admitting: Otolaryngology

## 2016-11-07 DIAGNOSIS — R490 Dysphonia: Secondary | ICD-10-CM | POA: Diagnosis not present

## 2016-12-12 ENCOUNTER — Other Ambulatory Visit: Payer: Self-pay | Admitting: Adult Health

## 2017-01-14 ENCOUNTER — Other Ambulatory Visit: Payer: Self-pay | Admitting: Adult Health

## 2017-01-23 ENCOUNTER — Ambulatory Visit (INDEPENDENT_AMBULATORY_CARE_PROVIDER_SITE_OTHER): Payer: 59 | Admitting: Otolaryngology

## 2017-01-23 DIAGNOSIS — K219 Gastro-esophageal reflux disease without esophagitis: Secondary | ICD-10-CM | POA: Diagnosis not present

## 2017-01-23 DIAGNOSIS — R490 Dysphonia: Secondary | ICD-10-CM

## 2017-03-15 ENCOUNTER — Ambulatory Visit (HOSPITAL_COMMUNITY)
Admission: EM | Admit: 2017-03-15 | Discharge: 2017-03-15 | Disposition: A | Discharge status: Home / Self Care | Payer: 59 | Attending: Family Medicine | Admitting: Family Medicine

## 2017-03-15 ENCOUNTER — Encounter (HOSPITAL_COMMUNITY): Payer: Self-pay | Admitting: Emergency Medicine

## 2017-03-15 DIAGNOSIS — B0222 Postherpetic trigeminal neuralgia: Secondary | ICD-10-CM

## 2017-03-15 MED ORDER — VALACYCLOVIR HCL 1 G PO TABS: 1000.0000 mg | ORAL_TABLET | Freq: Three times a day (TID) | ORAL | 0 refills | Status: DC

## 2017-03-15 NOTE — ED Provider Notes (Signed)
[place]    CSN: [id number] Arrival date & time: 03/15/17  1216     History   Chief Complaint Chief Complaint  Patient presents with  . Rash    HPI [name] is a 51 y.o. female.   Pt here for possible shingles on right eye onset yest  Reports it started out w/tingly sensation a couple of days ago and then yest she noticed a rash on left eyelid   Denies fevers, chills  Her daughter is getting married on May 19.  She works as a Marine scientist.       Past Medical History:  Diagnosis Date  . Allergy   . Asthma    dxed at age 47  . Cataracts, bilateral   . Constipation 10/29/2013  . Cystocele 10/29/2013  . Depression   . GERD (gastroesophageal reflux disease)   . Hemorrhoid 01/12/2015  . Herpes simplex virus (HSV) infection   . Migraine    last migraine:01/06/13  . Migraines 03/30/2013  . Rectocele 10/29/2013  . Restless leg syndrome   . Shortness of breath    SOB with exertion  . SUI (stress urinary incontinence, female)   . Vaginal dryness 04/05/2016  . Vaginal pain 04/05/2016  . Varicose veins     Patient Active Problem List   Diagnosis Date Noted  . Vaginal pain 04/05/2016  . Vaginal dryness 04/05/2016  . Hemorrhoid 01/12/2015  . Cystocele 10/29/2013  . Rectocele 10/29/2013  . Constipation 10/29/2013  . 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 Dyspnea 08/03/2013  . Cough 08/03/2013  . Bronchiectasis ([place]) 08/03/2013  . Rotator cuff impingement syndrome 06/17/2013  . Pain in joint, shoulder region 06/17/2013  . Migraines 03/30/2013  . Nausea and vomiting 03/30/2013  . Abdominal pain 03/30/2013  . Chest pain at rest 01/01/2013  . Varicose veins of lower extremities with other complications 65/68/1275  . KNEE, ARTHRITIS, DEGEN./OSTEO 06/07/2009  . TOTAL KNEE FOLLOW-UP 06/07/2009  . ASTHMA 07/07/2007    Past Surgical History:  Procedure Laterality Date  . ABDOMINAL HYSTERECTOMY    . BACK SURGERY     C5-6 decompression and fusion  .  bilateral oopherectomy     with posterior repair; perineoplasty  . CATARACT EXTRACTION    . CATARACT EXTRACTION W/PHACO  07/13/2012   Procedure: CATARACT EXTRACTION PHACO AND INTRAOCULAR LENS PLACEMENT (IOC);  Surgeon: Williams Che, MD;  Location: [facility];  Service: Ophthalmology;  Laterality: Right;  CDE:1.33  . CATARACT EXTRACTION W/PHACO Left 01/11/2013   Procedure: CATARACT EXTRACTION PHACO AND INTRAOCULAR LENS PLACEMENT (IOC);  Surgeon: Williams Che, MD;  Location: [facility];  Service: Ophthalmology;  Laterality: Left;  CDE 0.00  . CESAREAN SECTION    . COLONOSCOPY  03/12/2012   Procedure: COLONOSCOPY;  Surgeon: Rogene Houston, MD;  [facility];  Service: Endoscopy;  Laterality: N/A;  1200  . ENDOVENOUS ABLATION SAPHENOUS VEIN W/ LASER  12-12-2011   left greater saphenous vein     . ESOPHAGOGASTRODUODENOSCOPY N/A 06/01/2015   Procedure: ESOPHAGOGASTRODUODENOSCOPY (EGD);  Surgeon: Rogene Houston, MD;  [facility];  Service: Endoscopy;  Laterality: N/A;  255  . NISSEN FUNDOPLICATION    . REPLACEMENT TOTAL KNEE BILATERAL    . SEPTOPLASTY    . TONSILLECTOMY    . TUBAL LIGATION      OB History    Gravida Para Term Preterm AB Living   2 2       2    SAB TAB Ectopic  Multiple Live Births           2       Home Medications    Prior to Admission medications   Medication Sig Start Date End Date Taking? Authorizing Provider  albuterol (VENTOLIN HFA) 108 (90 BASE) MCG/ACT inhaler Inhale 2 puffs into the lungs every 6 (six) hours as needed. For shortness of breath   Yes Historical Provider, MD  amphetamine-dextroamphetamine (ADDERALL) 20 MG tablet Take 20 mg by mouth 2 (two) times daily.   Yes Historical Provider, MD  azelastine (ASTELIN) 137 MCG/SPRAY nasal spray 1 spray 2 (two) times daily.  08/20/13  Yes Historical Provider, MD  beclomethasone (QVAR) 80 MCG/ACT inhaler Inhale 2 puffs into the lungs 2 (two) times daily.   Yes Historical Provider, MD  buPROPion  (WELLBUTRIN SR) 150 MG 12 hr tablet Take 150 mg by mouth 2 (two) times daily.   Yes Historical Provider, MD  estradiol (VIVELLE-DOT) 0.025 MG/24HR Place 1 patch onto the skin 2 (two) times a week. 10/28/16  Yes Estill Dooms, NP  fluconazole (DIFLUCAN) 150 MG tablet TAKE ONE TABLET BY MOUTH NOW, REPEAT DOSE IN 3 DAYS 01/14/17  Yes Estill Dooms, NP  fluticasone (FLONASE) 50 MCG/ACT nasal spray Place 1 spray into the nose 2 (two) times daily.   Yes Historical Provider, MD  Fluticasone Furoate-Vilanterol (BREO ELLIPTA IN) Inhale 1 puff into the lungs daily.   Yes Historical Provider, MD  montelukast (SINGULAIR) 10 MG tablet Take 10 mg by mouth at bedtime.   Yes Historical Provider, MD  pantoprazole (PROTONIX) 40 MG tablet Take 1 tablet (40 mg total) by mouth daily. Take 30-60 min before first meal of the day Patient taking differently: Take 40 mg by mouth 2 (two) times daily. Take 30-60 min before first meal of the day 10/06/13  Yes Tanda Rockers, MD  rizatriptan (MAXALT) 10 MG tablet Take 10 mg by mouth as needed. May repeat in 2 hours if needed    Yes Historical Provider, MD  temazepam (RESTORIL) 15 MG capsule TAKE ONE CAPSULE BY MOUTH AT BEDTIME AS NEEDED FOR SLEEP 12/13/16  Yes Estill Dooms, NP  Cyanocobalamin (VITAMIN B-12 PO) Take by mouth daily.    Historical Provider, MD  EPIPEN 2-PAK 0.3 MG/0.3ML SOAJ injection as needed.  10/09/14   Historical Provider, MD  furosemide (LASIX) 40 MG tablet Take 40 mg by mouth as needed.  11/24/14   Historical Provider, MD  valACYclovir (VALTREX) 1000 MG tablet Take 1 tablet (1,000 mg total) by mouth 3 (three) times daily. 03/15/17   Robyn Haber, MD    Family History Family History  Problem Relation Age of Onset  . Diabetes Mother   . Depression Mother   . Diabetes type II Mother   . Migraines Sister   . Other Sister     migranes  . Depression Sister   . Fibromyalgia Sister   . Hypertension Sister   . Hypertension Father   . Heart  failure Father   . Asthma Maternal Grandmother   . Hypertension Brother   . Cancer Paternal Grandmother   . Asthma Child     Social History Social History  Substance Use Topics  . Smoking status: Former Smoker    Packs/day: 0.25    Years: 2.00    Types: Cigarettes    Quit date: 07/23/1981  . Smokeless tobacco: Never Used  . Alcohol use Yes     Comment: occassional glass of wine or beer  Allergies   Hydrocodone; Aminophylline; Nystatin; Penicillins; and Sulfamethoxazole-trimethoprim   Review of Systems Review of Systems  Constitutional: Negative.   HENT: Negative.   Eyes: Positive for pain.  Respiratory: Negative.      Physical Exam Triage Vital Signs ED Triage Vitals [03/15/17 1318]  Enc Vitals Group     BP 138/87     Pulse Rate (!) 111     Resp 20     Temp 97.8 F (36.6 C)     Temp Source Oral     SpO2 99 %     Weight      Height      Head Circumference      Peak Flow      Pain Score 6     Pain Loc      Pain Edu?      Excl. in [place]?    No data found.   Updated Vital Signs BP 138/87 (BP Location: Left Arm)   Pulse (!) 111   Temp 97.8 F (36.6 C) (Oral)   Resp 20   SpO2 99%    Physical Exam  Constitutional: She is oriented to person, place, and time. She appears well-developed and well-nourished.  HENT:  Right Ear: External ear normal.  Left Ear: External ear normal.  Mouth/Throat: Oropharynx is clear and moist.  Eyes: Conjunctivae and EOM are normal. Pupils are equal, round, and reactive to light.  Right upper lid early vesicles  Cardiovascular: Normal rate.   Pulmonary/Chest: Effort normal.  Musculoskeletal: Normal range of motion.  Neurological: She is alert and oriented to person, place, and time.  Skin: Skin is warm and dry. There is erythema.  Nursing note and vitals reviewed.    UC Treatments / Results  Labs (all labs ordered are listed, but only abnormal results are displayed) Labs Reviewed - No data to display  EKG  EKG  Interpretation None       Radiology No results found.  Procedures Procedures (including critical care time)  Medications Ordered in UC Medications - No data to display   Initial Impression / Assessment and Plan / UC Course  I have reviewed the triage vital signs and the nursing notes.  Pertinent labs & imaging results that were available during my care of the patient were reviewed by me and considered in my medical decision making (see chart for details).     Final Clinical Impressions(s) / UC Diagnoses   Final diagnoses:  Trigeminal herpes zoster    New Prescriptions New Prescriptions   VALACYCLOVIR (VALTREX) 1000 MG TABLET    Take 1 tablet (1,000 mg total) by mouth 3 (three) times daily.     Robyn Haber, MD 03/15/17 1352

## 2017-03-15 NOTE — ED Triage Notes (Signed)
Pt here for possible shingles on left eye onset yest  Reports it started out w/tingly sensation a couple of days ago and then yest she noticed a rash on left eyelid   Denies fevers, chills  A&O x4... NAD

## 2017-03-31 ENCOUNTER — Other Ambulatory Visit: Payer: Self-pay | Admitting: Adult Health

## 2017-04-10 ENCOUNTER — Encounter: Payer: Self-pay | Admitting: Women's Health

## 2017-04-10 ENCOUNTER — Other Ambulatory Visit: Payer: 59 | Admitting: Adult Health

## 2017-04-10 ENCOUNTER — Ambulatory Visit (INDEPENDENT_AMBULATORY_CARE_PROVIDER_SITE_OTHER): Payer: 59 | Admitting: Women's Health

## 2017-04-10 VITALS — BP 138/88 | HR 99 | Ht 64.0 in | Wt 177.0 lb

## 2017-04-10 DIAGNOSIS — Z01419 Encounter for gynecological examination (general) (routine) without abnormal findings: Secondary | ICD-10-CM | POA: Diagnosis not present

## 2017-04-10 NOTE — Progress Notes (Signed)
Subjective:   [name] is a 51 y.o. G67P2002 Caucasian female here for a routine well-woman exam.  No LMP recorded. Patient has had a hysterectomy.    Current complaints: weight gain. Has gained 16lbs since her visit last year. Was doing AirHeart at that time/HCG, was very expensive and lost 15lbs. Is eating a lot of sweets, sedentary/not exercising/works from home. Has tried Julien Girt w/o success Doing well on Vivelle dots for vasomotor sx, no more hot flashes PCP: Dr. Luan Pulling, sees him today right after her appt here, wants labs done here as she has been fasting and is more convenient for her       Does desire labs  Social History: Sexual: heterosexual Marital Status: married Living situation: with spouse Occupation: Therapist, sports w/ [employer] Tobacco/alcohol: no tobacco, occ glass wine Illicit drugs: no history of illicit drug use  The following portions of the patient's history were reviewed and updated as appropriate: allergies, current medications, past family history, past medical history, past social history, past surgical history and problem list.  Past Medical History Past Medical History:  Diagnosis Date  . Allergy   . Asthma    dxed at age 46  . Cataracts, bilateral   . Constipation 10/29/2013  . Cystocele 10/29/2013  . Depression   . GERD (gastroesophageal reflux disease)   . Hemorrhoid 01/12/2015  . Herpes simplex virus (HSV) infection   . Migraine    last migraine:01/06/13  . Migraines 03/30/2013  . Rectocele 10/29/2013  . Restless leg syndrome   . Shortness of breath    SOB with exertion  . SUI (stress urinary incontinence, female)   . Vaginal dryness 04/05/2016  . Vaginal pain 04/05/2016  . Varicose veins     Past Surgical History Past Surgical History:  Procedure Laterality Date  . ABDOMINAL HYSTERECTOMY    . BACK SURGERY     C5-6 decompression and fusion  . bilateral oopherectomy     with posterior repair; perineoplasty  . CATARACT EXTRACTION    . CATARACT EXTRACTION  W/PHACO  07/13/2012   Procedure: CATARACT EXTRACTION PHACO AND INTRAOCULAR LENS PLACEMENT (IOC);  Surgeon: Williams Che, MD;  Location: [facility];  Service: Ophthalmology;  Laterality: Right;  CDE:1.33  . CATARACT EXTRACTION W/PHACO Left 01/11/2013   Procedure: CATARACT EXTRACTION PHACO AND INTRAOCULAR LENS PLACEMENT (IOC);  Surgeon: Williams Che, MD;  Location: [facility];  Service: Ophthalmology;  Laterality: Left;  CDE 0.00  . CESAREAN SECTION    . COLONOSCOPY  03/12/2012   Procedure: COLONOSCOPY;  Surgeon: Rogene Houston, MD;  Location: [facility];  Service: Endoscopy;  Laterality: N/A;  1200  . ENDOVENOUS ABLATION SAPHENOUS VEIN W/ LASER  12-12-2011   left greater saphenous vein     . ESOPHAGOGASTRODUODENOSCOPY N/A 06/01/2015   Procedure: ESOPHAGOGASTRODUODENOSCOPY (EGD);  Surgeon: Rogene Houston, MD;  Location: [facility];  Service: Endoscopy;  Laterality: N/A;  255  . NISSEN FUNDOPLICATION    . REPLACEMENT TOTAL KNEE BILATERAL    . SEPTOPLASTY    . TONSILLECTOMY    . TUBAL LIGATION      Gynecologic History [id number]  No LMP recorded. Patient has had a hysterectomy. Contraception: status post hysterectomy Last Pap: a long time ago, s/p hysterectomy for menorrhagia/non-cancerous reasons, paps no longer indicated Last mammogram: Dec 2017. Results were: normal Last TCS: 2013, benign polyp, was told to return in 20yrs  Obstetric History OB History  Gravida Para Term Preterm AB Living  2 2 2  2  SAB TAB Ectopic Multiple Live Births          2    # Outcome Date GA Lbr Len/2nd Weight Sex Delivery Anes PTL Lv  2 Term 1997    M Vag-Spont   LIV  1 Term 17    F CS-LTranv   LIV      Current Medications Current Outpatient Prescriptions on File Prior to Visit  Medication Sig Dispense Refill  . albuterol (VENTOLIN HFA) 108 (90 BASE) MCG/ACT inhaler Inhale 2 puffs into the lungs every 6 (six) hours as needed. For shortness of breath    . amphetamine-dextroamphetamine  (ADDERALL) 20 MG tablet Take 20 mg by mouth 2 (two) times daily.    Marland Kitchen azelastine (ASTELIN) 137 MCG/SPRAY nasal spray 1 spray 2 (two) times daily.     . beclomethasone (QVAR) 80 MCG/ACT inhaler Inhale 2 puffs into the lungs 2 (two) times daily.    Marland Kitchen buPROPion (WELLBUTRIN SR) 150 MG 12 hr tablet Take 150 mg by mouth 2 (two) times daily.    . Cyanocobalamin (VITAMIN B-12 PO) Take by mouth daily.    Marland Kitchen EPIPEN 2-PAK 0.3 MG/0.3ML SOAJ injection as needed.   5  . estradiol (VIVELLE-DOT) 0.025 MG/24HR Place 1 patch onto the skin 2 (two) times a week. 24 patch 3  . fluticasone (FLONASE) 50 MCG/ACT nasal spray Place 1 spray into the nose 2 (two) times daily.    . Fluticasone Furoate-Vilanterol (BREO ELLIPTA IN) Inhale 1 puff into the lungs daily.    . furosemide (LASIX) 40 MG tablet Take 40 mg by mouth as needed.   12  . montelukast (SINGULAIR) 10 MG tablet Take 10 mg by mouth at bedtime.    . pantoprazole (PROTONIX) 40 MG tablet Take 1 tablet (40 mg total) by mouth daily. Take 30-60 min before first meal of the day (Patient taking differently: Take 40 mg by mouth 2 (two) times daily. Take 30-60 min before first meal of the day) 90 tablet 3  . rizatriptan (MAXALT) 10 MG tablet Take 10 mg by mouth as needed. May repeat in 2 hours if needed     . temazepam (RESTORIL) 15 MG capsule TAKE ONE CAPSULE BY MOUTH AT BEDTIME AS NEEDED FOR SLEEP 30 capsule 1   No current facility-administered medications on file prior to visit.     Review of Systems Patient denies any headaches, blurred vision, shortness of breath, chest pain, abdominal pain, problems with bowel movements, urination, or intercourse.  Objective:  BP 138/88 (BP Location: Left Arm, Patient Position: Sitting, Cuff Size: Normal)   Pulse 99   Ht 5\' 4"  (1.626 m)   Wt 177 lb (80.3 kg)   BMI 30.38 kg/m  Physical Exam  General:  Well developed, well nourished, no acute distress. She is alert and oriented x3. Skin:  Warm and dry Neck:  Midline  trachea, no thyromegaly or nodules Cardiovascular: Regular rate and rhythm, no murmur heard Lungs:  Effort normal, all lung fields clear to auscultation bilaterally Breasts:  No dominant palpable mass, retraction, or nipple discharge Abdomen:  Soft, non tender, no hepatosplenomegaly or masses Pelvic:  External genitalia is normal in appearance.  The vagina decreased color/rugae c/w postmenopausal status. The cervix, uterus, and ovaries are surgically absent.  Thin prep pap is not done. No abnormalities or tenderness noted. Extremities:  No swelling or varicosities noted Psych:  She has a normal mood and affect  Assessment:   Healthy well-woman exam Weight gain Postmenopausal on HRT  d/t vasomotor sx  Plan:  CBC, CMP, TSH, A1C, Lipid panel today, discussed if anything abnormal will let PCP manage Discussed strategies for weight loss, decreasing calories/carbs, increasing activity/exercise, can also look into Whole30 F/U 38yr for physical, or sooner if needed Mammogram Dec or sooner if problems Colonoscopy 5 more years for GI recommendation, or sooner if problems  Tawnya Crook CNM, [facility] 04/10/2017 8:48 AM

## 2017-04-10 NOTE — Patient Instructions (Signed)
Whole-30

## 2017-04-11 LAB — CBC
Hematocrit: 43.5 % (ref 34.0–46.6)
Hemoglobin: 14.8 g/dL (ref 11.1–15.9)
MCH: 30 pg (ref 26.6–33.0)
MCHC: 34 g/dL (ref 31.5–35.7)
MCV: 88 fL (ref 79–97)
PLATELETS: 351 10*3/uL (ref 150–379)
RBC: 4.93 x10E6/uL (ref 3.77–5.28)
RDW: 14.3 % (ref 12.3–15.4)
WBC: 8.9 10*3/uL (ref 3.4–10.8)

## 2017-04-11 LAB — LIPID PANEL
CHOLESTEROL TOTAL: 186 mg/dL (ref 100–199)
Chol/HDL Ratio: 2.9 ratio (ref 0.0–4.4)
HDL: 64 mg/dL (ref >39)
LDL Calculated: 94 mg/dL (ref 0–99)
TRIGLYCERIDES: 138 mg/dL (ref 0–149)
VLDL Cholesterol Cal: 28 mg/dL (ref 5–40)

## 2017-04-11 LAB — COMPREHENSIVE METABOLIC PANEL
ALK PHOS: 64 IU/L (ref 39–117)
ALT: 29 IU/L (ref 0–32)
AST: 25 IU/L (ref 0–40)
Albumin/Globulin Ratio: 1.6 (ref 1.2–2.2)
Albumin: 4.1 g/dL (ref 3.5–5.5)
BUN/Creatinine Ratio: 14 (ref 9–23)
BUN: 14 mg/dL (ref 6–24)
Bilirubin Total: 1 mg/dL (ref 0.0–1.2)
CHLORIDE: 103 mmol/L (ref 96–106)
CO2: 25 mmol/L (ref 18–29)
Calcium: 9.2 mg/dL (ref 8.7–10.2)
Creatinine, Ser: 0.99 mg/dL (ref 0.57–1.00)
GFR calc non Af Amer: 65 mL/min/{1.73_m2} (ref >59)
GFR, EST AFRICAN AMERICAN: 75 mL/min/{1.73_m2} (ref >59)
GLUCOSE: 96 mg/dL (ref 65–99)
Globulin, Total: 2.6 g/dL (ref 1.5–4.5)
Potassium: 4.6 mmol/L (ref 3.5–5.2)
Sodium: 142 mmol/L (ref 134–144)
TOTAL PROTEIN: 6.7 g/dL (ref 6.0–8.5)

## 2017-04-11 LAB — HEMOGLOBIN A1C
Est. average glucose Bld gHb Est-mCnc: 123 mg/dL
HEMOGLOBIN A1C: 5.9 % — AB (ref 4.8–5.6)

## 2017-04-11 LAB — TSH: TSH: 0.744 u[IU]/mL (ref 0.450–4.500)

## 2017-04-14 ENCOUNTER — Encounter: Payer: Self-pay | Admitting: Women's Health

## 2017-04-14 DIAGNOSIS — R7303 Prediabetes: Secondary | ICD-10-CM | POA: Insufficient documentation

## 2017-05-07 ENCOUNTER — Other Ambulatory Visit: Payer: Self-pay | Admitting: Adult Health

## 2017-08-06 ENCOUNTER — Other Ambulatory Visit: Payer: Self-pay | Admitting: Adult Health

## 2017-10-07 ENCOUNTER — Other Ambulatory Visit: Payer: Self-pay | Admitting: Adult Health

## 2017-11-06 ENCOUNTER — Other Ambulatory Visit (HOSPITAL_COMMUNITY): Payer: Self-pay | Admitting: Pulmonary Disease

## 2017-11-06 ENCOUNTER — Other Ambulatory Visit: Payer: Self-pay | Admitting: Pulmonary Disease

## 2017-11-06 DIAGNOSIS — R1011 Right upper quadrant pain: Secondary | ICD-10-CM

## 2017-11-10 ENCOUNTER — Ambulatory Visit (HOSPITAL_COMMUNITY)
Admission: RE | Admit: 2017-11-10 | Discharge: 2017-11-10 | Disposition: A | Discharge status: Home / Self Care | Payer: 59 | Source: Ambulatory Visit | Attending: Pulmonary Disease | Admitting: Pulmonary Disease

## 2017-11-10 DIAGNOSIS — R932 Abnormal findings on diagnostic imaging of liver and biliary tract: Secondary | ICD-10-CM | POA: Insufficient documentation

## 2017-11-10 DIAGNOSIS — R1011 Right upper quadrant pain: Secondary | ICD-10-CM | POA: Diagnosis not present

## 2017-11-20 ENCOUNTER — Other Ambulatory Visit (HOSPITAL_COMMUNITY): Payer: Self-pay | Admitting: Pulmonary Disease

## 2017-11-20 DIAGNOSIS — R109 Unspecified abdominal pain: Secondary | ICD-10-CM

## 2017-11-21 ENCOUNTER — Other Ambulatory Visit (HOSPITAL_COMMUNITY): Payer: Self-pay | Admitting: Pulmonary Disease

## 2017-11-21 DIAGNOSIS — R109 Unspecified abdominal pain: Secondary | ICD-10-CM

## 2017-11-21 DIAGNOSIS — R1011 Right upper quadrant pain: Secondary | ICD-10-CM

## 2017-11-24 ENCOUNTER — Encounter (HOSPITAL_COMMUNITY): Payer: 59

## 2017-11-24 ENCOUNTER — Encounter (HOSPITAL_COMMUNITY)
Admission: RE | Admit: 2017-11-24 | Discharge: 2017-11-24 | Disposition: A | Discharge status: Home / Self Care | Payer: 59 | Source: Ambulatory Visit | Attending: Pulmonary Disease | Admitting: Pulmonary Disease

## 2017-11-24 ENCOUNTER — Encounter (HOSPITAL_COMMUNITY): Payer: Self-pay

## 2017-11-24 ENCOUNTER — Ambulatory Visit (HOSPITAL_COMMUNITY)
Admission: RE | Admit: 2017-11-24 | Discharge: 2017-11-24 | Disposition: A | Discharge status: Home / Self Care | Payer: 59 | Source: Ambulatory Visit | Attending: Pulmonary Disease | Admitting: Pulmonary Disease

## 2017-11-24 DIAGNOSIS — R109 Unspecified abdominal pain: Secondary | ICD-10-CM | POA: Diagnosis present

## 2017-11-24 MED ORDER — TECHNETIUM TC 99M MEBROFENIN IV KIT
5.0000 | PACK | Freq: Once | INTRAVENOUS | Status: AC | PRN
  Administered 2017-11-24: 5 via INTRAVENOUS

## 2017-11-24 MED ORDER — SODIUM CHLORIDE 0.9% FLUSH
INTRAVENOUS | Status: AC
Start: 2017-11-24 — End: 2017-11-24
  Filled 2017-11-24: qty 180

## 2018-01-05 ENCOUNTER — Other Ambulatory Visit: Payer: Self-pay | Admitting: Adult Health

## 2018-01-08 ENCOUNTER — Ambulatory Visit (INDEPENDENT_AMBULATORY_CARE_PROVIDER_SITE_OTHER): Payer: 59 | Admitting: Adult Health

## 2018-01-08 ENCOUNTER — Encounter: Payer: Self-pay | Admitting: Adult Health

## 2018-01-08 VITALS — BP 124/70 | HR 78 | Ht 64.0 in | Wt 173.0 lb

## 2018-01-08 DIAGNOSIS — N644 Mastodynia: Secondary | ICD-10-CM | POA: Diagnosis not present

## 2018-01-08 NOTE — Progress Notes (Signed)
Subjective:     Patient ID: [name], female   DOB: December 23, 1965, 52 y.o.   MRN: [id number]  HPI [name] is a 52 year old white female in complaining of pain in left breast for about a week now. PCP is Dr [name].  Review of Systems Pain in left breast for about a week Reviewed past medical,surgical, social and family history. Reviewed medications and allergies.     Objective:   Physical Exam BP 124/70 (BP Location: Left Arm, Patient Position: Sitting, Cuff Size: Normal)   Pulse 78   Ht 5\' 4"  (1.626 m)   Wt 173 lb (78.5 kg)   BMI 29.70 kg/m     Skin warm and dry,  Breasts:no dominate palpable mass, retraction or nipple discharge on right, on left no retraction or nipple discharge, has thickness at 2 o'clock 8 FB from nipple with point tenderness over thickness. Will get diagnostic bilateral mammogram and [place].   Assessment:     1. Breast pain, left       Plan:     Bilateral diagnostic mammogram and right and left [place] if needed F/U prn

## 2018-01-20 ENCOUNTER — Ambulatory Visit (HOSPITAL_COMMUNITY)
Admission: RE | Admit: 2018-01-20 | Discharge: 2018-01-20 | Disposition: A | Discharge status: Home / Self Care | Payer: 59 | Source: Ambulatory Visit | Attending: Adult Health | Admitting: Adult Health

## 2018-01-20 DIAGNOSIS — R928 Other abnormal and inconclusive findings on diagnostic imaging of breast: Secondary | ICD-10-CM | POA: Insufficient documentation

## 2018-01-20 DIAGNOSIS — N644 Mastodynia: Secondary | ICD-10-CM

## 2018-02-08 ENCOUNTER — Other Ambulatory Visit: Payer: Self-pay | Admitting: Adult Health

## 2018-04-08 ENCOUNTER — Telehealth: Payer: Self-pay | Admitting: *Deleted

## 2018-04-08 MED ORDER — FLUCONAZOLE 150 MG PO TABS: ORAL_TABLET | ORAL | 2 refills | Status: DC

## 2018-04-08 MED ORDER — CIPROFLOXACIN HCL 500 MG PO TABS: 500.0000 mg | ORAL_TABLET | Freq: Two times a day (BID) | ORAL | 0 refills | Status: DC

## 2018-04-08 NOTE — Telephone Encounter (Signed)
Pt complaining of UTI symptoms, will rx cipro and diflucan,push fluids

## 2018-04-13 ENCOUNTER — Other Ambulatory Visit: Payer: 59 | Admitting: Adult Health

## 2018-05-13 ENCOUNTER — Encounter: Payer: Self-pay | Admitting: Adult Health

## 2018-05-13 ENCOUNTER — Ambulatory Visit (INDEPENDENT_AMBULATORY_CARE_PROVIDER_SITE_OTHER): Payer: 59 | Admitting: Adult Health

## 2018-05-13 VITALS — BP 137/83 | HR 105 | Ht 64.0 in | Wt 178.5 lb

## 2018-05-13 DIAGNOSIS — N941 Unspecified dyspareunia: Secondary | ICD-10-CM | POA: Diagnosis not present

## 2018-05-13 DIAGNOSIS — Z01411 Encounter for gynecological examination (general) (routine) with abnormal findings: Secondary | ICD-10-CM

## 2018-05-13 DIAGNOSIS — Z1211 Encounter for screening for malignant neoplasm of colon: Secondary | ICD-10-CM | POA: Diagnosis not present

## 2018-05-13 DIAGNOSIS — Z01419 Encounter for gynecological examination (general) (routine) without abnormal findings: Secondary | ICD-10-CM

## 2018-05-13 DIAGNOSIS — N898 Other specified noninflammatory disorders of vagina: Secondary | ICD-10-CM

## 2018-05-13 DIAGNOSIS — Z1212 Encounter for screening for malignant neoplasm of rectum: Secondary | ICD-10-CM | POA: Diagnosis not present

## 2018-05-13 DIAGNOSIS — R232 Flushing: Secondary | ICD-10-CM

## 2018-05-13 LAB — HEMOCCULT GUIAC POC 1CARD (OFFICE): FECAL OCCULT BLD: NEGATIVE

## 2018-05-13 MED ORDER — ZOLPIDEM TARTRATE 10 MG PO TABS: 10.0000 mg | ORAL_TABLET | Freq: Every evening | ORAL | 1 refills | Status: DC | PRN

## 2018-05-13 NOTE — Progress Notes (Signed)
Patient ID: [name], female   DOB: [birth year], 52 y.o.   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  PCP is Dr Luan Pulling.    Current Medications, Allergies, Past Medical History, Past Surgical History, Family History and Social History were reviewed in [employer] record.     Review of Systems: Patient denies any daily headaches, hearing loss, fatigue, blurred vision, shortness of breath, chest pain, abdominal pain, problems with bowel movements, urination, or intercourse(pain with sex, dry). No joint pain or mood swings. +hot flashes, does not always sleep well Has gained some weight since having the flu   Physical Exam:BP 137/83 (BP Location: Left Arm, Patient Position: Sitting, Cuff Size: Normal)   Pulse (!) 105   Ht 5\' 4"  (1.626 m)   Wt 178 lb 8 oz (81 kg)   BMI 30.64 kg/m  General:  Well developed, well nourished, no acute distress Skin:  Warm and dry Neck:  Midline trachea, normal thyroid, good ROM, no lymphadenopathy Lungs; Clear to auscultation bilaterally Breast:  No dominant palpable mass, retraction, or nipple discharge Cardiovascular: Regular rate and rhythm Abdomen:  Soft, non tender, no hepatosplenomegaly Pelvic:  External genitalia is normal in appearance, no lesions.  The vagina is normal in appearance for age, has loss of moisture and color and rugae. Urethra has no lesions or masses. The cervix and uterus are absent.  No adnexal masses or tenderness noted.Bladder is non tender, no masses felt. Rectal: Good sphincter tone, no polyps, or hemorrhoids felt.  Hemoccult negative. Extremities/musculoskeletal:  No swelling or varicosities noted, no clubbing or cyanosis Psych:  No mood changes, alert and cooperative,seems happy PHQ 2 score 0.  Impression:  1. Encounter for well woman exam with routine gynecological exam    2. Screening for colorectal cancer   3. Dyspareunia in female   4. Vaginal dryness      Plan: Meds ordered this encounter  Medications  . zolpidem (AMBIEN) 10 MG tablet    Sig: Take 1 tablet (10 mg total) by mouth at bedtime as needed for sleep.    Dispense:  30 tablet    Refill:  1    Order Specific Question:   Supervising Provider    Answer:   Elonda Husky, LUTHER H [[id number]]  Try coconut or olive oil with sex She says estrogen gives her migraines, and luvena did not really help that much  Physical in 1 year Mammogram yearly Try to increase exercise for weight loss Labs with PCP

## 2018-05-29 ENCOUNTER — Telehealth: Payer: Self-pay | Admitting: Adult Health

## 2018-05-29 MED ORDER — METRONIDAZOLE 500 MG PO TABS: 500.0000 mg | ORAL_TABLET | Freq: Two times a day (BID) | ORAL | 2 refills | Status: DC

## 2018-05-29 NOTE — Telephone Encounter (Signed)
Pt Wants flagyl refilled, using luvena, will do

## 2018-07-19 ENCOUNTER — Other Ambulatory Visit: Payer: Self-pay | Admitting: Adult Health

## 2018-08-11 ENCOUNTER — Other Ambulatory Visit: Payer: Self-pay | Admitting: Adult Health

## 2018-10-02 ENCOUNTER — Other Ambulatory Visit: Payer: Self-pay | Admitting: Adult Health

## 2018-11-09 ENCOUNTER — Ambulatory Visit (HOSPITAL_COMMUNITY)
Admission: RE | Admit: 2018-11-09 | Discharge: 2018-11-09 | Disposition: A | Discharge status: Home / Self Care | Payer: 59 | Source: Ambulatory Visit | Attending: Pulmonary Disease | Admitting: Pulmonary Disease

## 2018-11-09 ENCOUNTER — Other Ambulatory Visit (HOSPITAL_COMMUNITY): Payer: Self-pay | Admitting: Pulmonary Disease

## 2018-11-09 DIAGNOSIS — J189 Pneumonia, unspecified organism: Secondary | ICD-10-CM | POA: Diagnosis not present

## 2018-11-13 ENCOUNTER — Other Ambulatory Visit: Payer: Self-pay | Admitting: Adult Health

## 2018-12-21 ENCOUNTER — Other Ambulatory Visit: Payer: Self-pay | Admitting: Adult Health

## 2019-02-01 ENCOUNTER — Other Ambulatory Visit: Payer: Self-pay | Admitting: Adult Health

## 2019-02-19 ENCOUNTER — Encounter (INDEPENDENT_AMBULATORY_CARE_PROVIDER_SITE_OTHER): Payer: Self-pay | Admitting: *Deleted

## 2019-03-04 ENCOUNTER — Other Ambulatory Visit: Payer: Self-pay | Admitting: Adult Health

## 2019-03-26 ENCOUNTER — Other Ambulatory Visit: Payer: Self-pay | Admitting: Adult Health

## 2019-05-05 ENCOUNTER — Telehealth: Payer: Self-pay | Admitting: *Deleted

## 2019-05-05 ENCOUNTER — Other Ambulatory Visit: Payer: 59

## 2019-05-05 DIAGNOSIS — Z20822 Contact with and (suspected) exposure to covid-19: Secondary | ICD-10-CM

## 2019-05-05 NOTE — Telephone Encounter (Signed)
Dr. Luan Pulling called to refer pt and her husband for covid-19 testing.  Notified patient and they are scheduled for testing today at the [facility] in [place] at 10:45. Advised to wear a mask, stay in car with windows rolled up until ready to be tested. Verbal understanding noted.

## 2019-05-11 ENCOUNTER — Other Ambulatory Visit: Payer: Self-pay | Admitting: Adult Health

## 2019-05-11 LAB — NOVEL CORONAVIRUS, NAA: SARS-CoV-2, NAA: NOT DETECTED

## 2019-06-17 ENCOUNTER — Telehealth: Payer: Self-pay | Admitting: *Deleted

## 2019-06-17 MED ORDER — ZOLPIDEM TARTRATE 10 MG PO TABS: 10.0000 mg | ORAL_TABLET | Freq: Every evening | ORAL | 0 refills | Status: DC | PRN

## 2019-06-17 NOTE — Telephone Encounter (Signed)
Pt is requesting a refill on either Restoril or Ambien, whichever you think is best. She has been on Ambien and didn't know if a change would help.  Also, pt was tested for Covid and it was negative but did test + for [facility] Spotted Fever. Please advise. Thanks!! [place]

## 2019-06-17 NOTE — Telephone Encounter (Signed)
Left message that I refilled her Ambien

## 2019-07-14 ENCOUNTER — Encounter: Payer: Self-pay | Admitting: Family Medicine

## 2019-08-16 ENCOUNTER — Other Ambulatory Visit: Payer: Self-pay | Admitting: Adult Health

## 2019-08-17 ENCOUNTER — Other Ambulatory Visit: Payer: Self-pay | Admitting: *Deleted

## 2019-08-17 MED ORDER — ZOLPIDEM TARTRATE 10 MG PO TABS: 10.0000 mg | ORAL_TABLET | Freq: Every evening | ORAL | 0 refills | Status: DC | PRN

## 2019-09-14 ENCOUNTER — Other Ambulatory Visit: Payer: Self-pay

## 2019-09-14 ENCOUNTER — Ambulatory Visit (INDEPENDENT_AMBULATORY_CARE_PROVIDER_SITE_OTHER): Payer: 59 | Admitting: Family Medicine

## 2019-09-14 ENCOUNTER — Encounter: Payer: Self-pay | Admitting: Family Medicine

## 2019-09-14 VITALS — BP 144/69 | HR 70 | Temp 98.6°F | Ht 64.5 in | Wt 183.0 lb

## 2019-09-14 DIAGNOSIS — Z1239 Encounter for other screening for malignant neoplasm of breast: Secondary | ICD-10-CM | POA: Diagnosis not present

## 2019-09-14 DIAGNOSIS — F339 Major depressive disorder, recurrent, unspecified: Secondary | ICD-10-CM | POA: Diagnosis not present

## 2019-09-14 DIAGNOSIS — J45909 Unspecified asthma, uncomplicated: Secondary | ICD-10-CM | POA: Diagnosis not present

## 2019-09-14 DIAGNOSIS — K219 Gastro-esophageal reflux disease without esophagitis: Secondary | ICD-10-CM | POA: Diagnosis not present

## 2019-09-14 MED ORDER — DEXLANSOPRAZOLE 30 MG PO CPDR: 30.0000 mg | DELAYED_RELEASE_CAPSULE | Freq: Every day | ORAL | 5 refills | Status: DC

## 2019-09-14 NOTE — Progress Notes (Signed)
New Patient Office Visit  Subjective:  Patient ID: [name], female    DOB: 12-21-1965  Age: 53 y.o. MRN: [id number]  CC:  Chief Complaint  Patient presents with  . Establish Care  . Weight Loss    HPI [name] presents for asthma-inhalers used as directed. Environmental changes trigger asthma-well controlled currently  Past Medical History:  Diagnosis Date  . Allergy   . Asthma    dxed at age 15  . Cataracts, bilateral   . Constipation 10/29/2013  . Cystocele 10/29/2013  . Depression   . GERD (gastroesophageal reflux disease)   . Hemorrhoid 01/12/2015  . Herpes simplex virus (HSV) infection   . Migraine    last migraine:01/06/13  . Migraines 03/30/2013  . Rectocele 10/29/2013  . Restless leg syndrome   . Shortness of breath    SOB with exertion  . SUI (stress urinary incontinence, female)   . Vaginal dryness 04/05/2016  . Vaginal pain 04/05/2016  . Varicose veins     Past Surgical History:  Procedure Laterality Date  . ABDOMINAL HYSTERECTOMY    . BACK SURGERY     C5-6 decompression and fusion  . bilateral oopherectomy     with posterior repair; perineoplasty  . CATARACT EXTRACTION    . CATARACT EXTRACTION W/PHACO  07/13/2012   Procedure: CATARACT EXTRACTION PHACO AND INTRAOCULAR LENS PLACEMENT (IOC);  Surgeon: Williams Che, MD;  Location: [facility];  Service: Ophthalmology;  Laterality: Right;  CDE:1.33  . CATARACT EXTRACTION W/PHACO Left 01/11/2013   Procedure: CATARACT EXTRACTION PHACO AND INTRAOCULAR LENS PLACEMENT (IOC);  Surgeon: Williams Che, MD;  Location: [facility];  Service: Ophthalmology;  Laterality: Left;  CDE 0.00  . CESAREAN SECTION    . COLONOSCOPY  03/12/2012   Procedure: COLONOSCOPY;  Surgeon: Rogene Houston, MD;  [facility];  Service: Endoscopy;  Laterality: N/A;  1200  . ENDOVENOUS ABLATION SAPHENOUS VEIN W/ LASER  12-12-2011   left greater saphenous vein     . ESOPHAGOGASTRODUODENOSCOPY N/A 06/01/2015   Procedure:  ESOPHAGOGASTRODUODENOSCOPY (EGD);  Surgeon: Rogene Houston, MD;  [facility];  Service: Endoscopy;  Laterality: N/A;  255  . NISSEN FUNDOPLICATION    . REPLACEMENT TOTAL KNEE BILATERAL    . SEPTOPLASTY    . TONSILLECTOMY    . TUBAL LIGATION      Family History  Problem Relation Age of Onset  . Diabetes Mother   . Depression Mother   . Diabetes type II Mother   . Migraines Sister   . Other Sister        migranes  . Depression Sister   . Fibromyalgia Sister   . Hypertension Sister   . Hypertension Father   . Heart failure Father   . Asthma Maternal Grandmother   . Hypertension Brother   . Cancer Paternal Grandmother   . Asthma Child     Social History   Socioeconomic History  . Marital status: Married    Spouse name: Not on file  . Number of children: Not on file  . Years of education: Not on file  . Highest education level: Not on file  Occupational History  . Occupation: Programmer, multimedia: [employer]  Social Needs  . Financial resource strain: Not on file  . Food insecurity    Worry: Not on file    Inability: Not on file  . Transportation needs    Medical: Not on file  Non-medical: Not on file  Tobacco Use  . Smoking status: Former Smoker    Packs/day: 0.25    Years: 2.00    Pack years: 0.50    Types: Cigarettes    Quit date: 07/23/1981    Years since quitting: 38.1  . Smokeless tobacco: Never Used  Substance and Sexual Activity  . Alcohol use: Yes    Comment: occassional glass of wine or beer  . Drug use: No  . Sexual activity: Yes    Birth control/protection: Surgical    Comment: hyst  Lifestyle  . Physical activity    Days per week: Not on file    Minutes per session: Not on file  . Stress: Not on file  Relationships  . Social Herbalist on phone: Not on file    Gets together: Not on file    Attends religious service: Not on file    Active member of club or organization: Not on file    Attends meetings of  clubs or organizations: Not on file    Relationship status: Not on file  . Intimate partner violence    Fear of current or ex partner: Not on file    Emotionally abused: Not on file    Physically abused: Not on file    Forced sexual activity: Not on file  Other Topics Concern  . Not on file  Social History Narrative  . Not on file    ROS Review of Systems  Constitutional: Negative.   HENT: Positive for congestion and sinus pressure.   Eyes:       Glasses Cataracts bilat-surgery  Respiratory: Positive for wheezing.   Cardiovascular: Negative.   Gastrointestinal: Negative.   Endocrine:       Hot flashes  Genitourinary:       Incontinence-stress  Musculoskeletal: Positive for arthralgias and back pain.       Bilat knee replacements Chiropractor for adjustments  Skin: Negative.   Allergic/Immunologic: Positive for environmental allergies.  Neurological: Positive for headaches.       Migraines improved off estrogen  Hematological: Negative.   Psychiatric/Behavioral:       Depression    Objective:   Today's Vitals: BP (!) 144/69 (BP Location: Left Arm, Patient Position: Sitting, Cuff Size: Normal)   Pulse 70   Temp 98.6 F (37 C) (Oral)   Ht 5' 4.5" (1.638 m)   Wt 183 lb (83 kg)   SpO2 97%   BMI 30.93 kg/m   Physical Exam Constitutional:      Appearance: Normal appearance.  HENT:     Head: Normocephalic and atraumatic.     Right Ear: Tympanic membrane, ear canal and external ear normal.     Left Ear: Tympanic membrane, ear canal and external ear normal.     Nose: Nose normal.     Mouth/Throat:     Mouth: Mucous membranes are moist.  Eyes:     Conjunctiva/sclera: Conjunctivae normal.  Neck:     Musculoskeletal: Normal range of motion and neck supple.  Cardiovascular:     Rate and Rhythm: Normal rate and regular rhythm.     Pulses: Normal pulses.     Heart sounds: Normal heart sounds.  Pulmonary:     Effort: Pulmonary effort is normal.     Breath sounds:  Normal breath sounds.  Musculoskeletal: Normal range of motion.     Right lower leg: No edema.     Left lower leg: No edema.  Neurological:  General: No focal deficit present.     Mental Status: She is alert and oriented to person, place, and time.  Psychiatric:        Mood and Affect: Mood normal.        Behavior: Behavior normal.     Assessment & Plan:    Outpatient Encounter Medications as of 09/14/2019  Medication Sig  . buPROPion (WELLBUTRIN XL) 150 MG 24 hr tablet Take 150 mg by mouth daily.  . cholecalciferol (VITAMIN D3) 25 MCG (1000 UT) tablet Take 1,000 Units by mouth daily.  Marland Kitchen albuterol (VENTOLIN HFA) 108 (90 BASE) MCG/ACT inhaler Inhale 2 puffs into the lungs every 6 (six) hours as needed. For shortness of breath  . azelastine (ASTELIN) 137 MCG/SPRAY nasal spray 1 spray as needed.   . beclomethasone (QVAR) 80 MCG/ACT inhaler Inhale 2 puffs into the lungs 2 (two) times daily.  . fluconazole (DIFLUCAN) 150 MG tablet TAKE 1 TABLET BY MOUTH NOW, THEN REPEAT DOSE IN 3 DAYS  . fluticasone (FLONASE) 50 MCG/ACT nasal spray Place 1 spray into the nose daily.   . Fluticasone Furoate-Vilanterol (BREO ELLIPTA IN) Inhale 1 puff into the lungs daily.  . furosemide (LASIX) 40 MG tablet Take 40 mg by mouth as needed.   . montelukast (SINGULAIR) 10 MG tablet Take 10 mg by mouth at bedtime.  . predniSONE (DELTASONE) 10 MG tablet   . valACYclovir (VALTREX) 1000 MG tablet TAKE 1 TABLET BY MOUTH TWO  TIMES DAILY (Patient taking differently: TAKE 1 TABLET BY MOUTH TWO  TIMES DAILY PRN)  . zolpidem (AMBIEN) 10 MG tablet Take 1 tablet (10 mg total) by mouth at bedtime as needed. for sleep  . [DISCONTINUED] amphetamine-dextroamphetamine (ADDERALL) 20 MG tablet Take 20 mg by mouth daily.   . [DISCONTINUED] buPROPion (WELLBUTRIN SR) 150 MG 12 hr tablet Take 150 mg by mouth 2 (two) times daily.  . [DISCONTINUED] metroNIDAZOLE (FLAGYL) 500 MG tablet Take 1 tablet (500 mg total) by mouth 2 (two)  times daily. (Patient not taking: Reported on 09/14/2019)  . [DISCONTINUED] pantoprazole (PROTONIX) 40 MG tablet Take 1 tablet (40 mg total) by mouth daily. Take 30-60 min before first meal of the day (Patient not taking: Reported on 09/14/2019)   No facility-administered encounter medications on file as of 09/14/2019.    1. Encounter for screening for malignant neoplasm of breast, unspecified screening modality  - [facility] Digital Screening; Future 2. Moderate asthma, unspecified whether complicated, unspecified whether persistent Seen by Dr. Luan Pulling in the past-intermittent asthma-trigger with weather changes-currently stable on Breo, albuterol, qvar and singulair  3. Gastroesophageal reflux disease without esophagitis dexilant-stable-surgery previously  4. Depression, recurrent ([place]) wellbutrin-stable-taking new job on [employer] better about new responsibilities Follow-up:  6 months Alonia Hannah Beat, MD

## 2019-09-14 NOTE — Patient Instructions (Signed)

## 2019-10-12 ENCOUNTER — Telehealth: Payer: Self-pay

## 2019-10-12 DIAGNOSIS — F339 Major depressive disorder, recurrent, unspecified: Secondary | ICD-10-CM

## 2019-10-12 DIAGNOSIS — J45909 Unspecified asthma, uncomplicated: Secondary | ICD-10-CM

## 2019-10-12 MED ORDER — BREO ELLIPTA 200-25 MCG/INH IN AEPB: 1.0000 | INHALATION_SPRAY | Freq: Every day | RESPIRATORY_TRACT | 0 refills | Status: DC

## 2019-10-12 MED ORDER — BUPROPION HCL ER (XL) 150 MG PO TB24: 150.0000 mg | ORAL_TABLET | Freq: Every day | ORAL | 0 refills | Status: DC

## 2019-10-12 MED ORDER — BECLOMETHASONE DIPROPIONATE 80 MCG/ACT IN AERS: 2.0000 | INHALATION_SPRAY | Freq: Two times a day (BID) | RESPIRATORY_TRACT | 0 refills | Status: DC

## 2019-10-12 MED ORDER — DEXLANSOPRAZOLE 30 MG PO CPDR: 30.0000 mg | DELAYED_RELEASE_CAPSULE | Freq: Every day | ORAL | 5 refills | Status: DC

## 2019-10-12 NOTE — Telephone Encounter (Signed)
[name], CMA  

## 2019-10-27 ENCOUNTER — Telehealth: Payer: Self-pay

## 2019-10-27 ENCOUNTER — Telehealth: Payer: Self-pay | Admitting: Family Medicine

## 2019-10-27 DIAGNOSIS — F339 Major depressive disorder, recurrent, unspecified: Secondary | ICD-10-CM

## 2019-10-27 DIAGNOSIS — J45909 Unspecified asthma, uncomplicated: Secondary | ICD-10-CM

## 2019-10-27 MED ORDER — BUPROPION HCL ER (XL) 150 MG PO TB24: 150.0000 mg | ORAL_TABLET | Freq: Every day | ORAL | 0 refills | Status: DC

## 2019-10-27 MED ORDER — BREO ELLIPTA 200-25 MCG/INH IN AEPB: 1.0000 | INHALATION_SPRAY | Freq: Every day | RESPIRATORY_TRACT | 0 refills | Status: DC

## 2019-10-27 MED ORDER — BECLOMETHASONE DIPROPIONATE 80 MCG/ACT IN AERS: 2.0000 | INHALATION_SPRAY | Freq: Two times a day (BID) | RESPIRATORY_TRACT | 0 refills | Status: DC

## 2019-10-27 NOTE — Telephone Encounter (Signed)
Patient is calling and states she is needing the following medications sent to [facility] with 90 day supply.   fluticasone furoate-vilanterol (BREO ELLIPTA) 200-25 MCG/INH AEPB  buPROPion (WELLBUTRIN XL) 150 MG 24 hr tablet   she states she takes this 3x a day a total of 450 MG a day.  Dexilant 60mg  capsules 1x a day   Patient is also needing beclomethasone (QVAR) 80 MCG/ACT   but states this requires prior authorization.    [place], [place] [phone] (Phone) [phone] (Fax)

## 2019-10-27 NOTE — Telephone Encounter (Signed)
Done

## 2019-10-27 NOTE — Telephone Encounter (Signed)
LeighAnn Claudia Alvizo, CMA  

## 2019-11-01 ENCOUNTER — Ambulatory Visit (HOSPITAL_COMMUNITY)
Admission: RE | Admit: 2019-11-01 | Discharge: 2019-11-01 | Disposition: A | Discharge status: Home / Self Care | Payer: 59 | Source: Ambulatory Visit | Attending: Family Medicine | Admitting: Family Medicine

## 2019-11-01 ENCOUNTER — Telehealth: Payer: Self-pay | Admitting: Orthopedic Surgery

## 2019-11-01 ENCOUNTER — Other Ambulatory Visit: Payer: Self-pay

## 2019-11-01 DIAGNOSIS — Z1239 Encounter for other screening for malignant neoplasm of breast: Secondary | ICD-10-CM

## 2019-11-01 DIAGNOSIS — Z1231 Encounter for screening mammogram for malignant neoplasm of breast: Secondary | ICD-10-CM | POA: Insufficient documentation

## 2019-11-01 MED ORDER — CLINDAMYCIN HCL 150 MG PO CAPS: 600.0000 mg | ORAL_CAPSULE | Freq: Once | ORAL | 2 refills | Status: AC

## 2019-11-01 NOTE — Telephone Encounter (Signed)
Patient aware.

## 2019-11-01 NOTE — Telephone Encounter (Signed)
Patient called to inquire about pre-dental antibiotics; states has dental cleaning appointment in January. Patient's bilateral total knee replacement surgeries were in 2007. States can tolerate only Clindomyacin; if prescription issued, pharmacy is [place], [place].  [phone].

## 2019-11-01 NOTE — Telephone Encounter (Signed)
Sent in per protocol  

## 2019-11-04 ENCOUNTER — Other Ambulatory Visit: Payer: Self-pay | Admitting: Adult Health

## 2019-11-23 ENCOUNTER — Telehealth: Payer: Self-pay | Admitting: Family Medicine

## 2019-11-23 NOTE — Telephone Encounter (Signed)
Routing to Dr. Corum for advice ? 

## 2019-11-23 NOTE — Telephone Encounter (Signed)
[name] and Husband [name] are both aware

## 2019-11-23 NOTE — Telephone Encounter (Signed)
Testing 5-7 days post exposure Quarantine for 10 days post exposure For appointment for testing text COVID to [phone] or log onto [email or web address]  or call [phone]

## 2019-11-23 NOTE — Telephone Encounter (Signed)
Patient is calling and states both of her kids tested positive for covid and they were at her house on christmas day for a short period of time but did wear mask. She states she has no symptoms but would like to know if Dr. Holly Bodily will want her to go tested.

## 2019-11-24 ENCOUNTER — Other Ambulatory Visit: Payer: Self-pay

## 2019-11-24 ENCOUNTER — Other Ambulatory Visit: Payer: 59

## 2019-11-24 ENCOUNTER — Ambulatory Visit: Payer: 59 | Attending: Internal Medicine

## 2019-11-24 DIAGNOSIS — Z20828 Contact with and (suspected) exposure to other viral communicable diseases: Secondary | ICD-10-CM | POA: Insufficient documentation

## 2019-11-24 DIAGNOSIS — Z20822 Contact with and (suspected) exposure to covid-19: Secondary | ICD-10-CM

## 2019-11-25 LAB — NOVEL CORONAVIRUS, NAA: SARS-CoV-2, NAA: NOT DETECTED

## 2019-12-20 ENCOUNTER — Telehealth: Payer: Self-pay | Admitting: Family Medicine

## 2019-12-20 NOTE — Telephone Encounter (Signed)
Patient is calling and requesting a refill on dexlansoprazole (DEXILANT) 60 MG capsule    90 day supply to [facility] order pharmacy.    [place], [place] Phone:  [phone]  Fax:  [phone]

## 2019-12-21 ENCOUNTER — Telehealth: Payer: Self-pay

## 2019-12-21 DIAGNOSIS — J45909 Unspecified asthma, uncomplicated: Secondary | ICD-10-CM

## 2019-12-21 MED ORDER — DEXLANSOPRAZOLE 30 MG PO CPDR: 30.0000 mg | DELAYED_RELEASE_CAPSULE | Freq: Every day | ORAL | 5 refills | Status: DC

## 2019-12-21 NOTE — Telephone Encounter (Signed)
[name], CMA  

## 2019-12-21 NOTE — Telephone Encounter (Signed)
Done

## 2019-12-30 ENCOUNTER — Telehealth: Payer: Self-pay

## 2019-12-30 ENCOUNTER — Telehealth: Payer: Self-pay | Admitting: Family Medicine

## 2019-12-30 DIAGNOSIS — K219 Gastro-esophageal reflux disease without esophagitis: Secondary | ICD-10-CM

## 2019-12-30 DIAGNOSIS — J45909 Unspecified asthma, uncomplicated: Secondary | ICD-10-CM

## 2019-12-30 MED ORDER — DEXLANSOPRAZOLE 60 MG PO CPDR: 60.0000 mg | DELAYED_RELEASE_CAPSULE | Freq: Every day | ORAL | 0 refills | Status: DC

## 2019-12-30 NOTE — Telephone Encounter (Signed)
Patient is calling and states that she has been approved through December of 2022 for Dexlansoprazole 60 MG capsule   She states the pharmacy only received Dexlansoprazole 30 MG capsule  And it was for 30 days with no refills. Patient states it has to be a 90 day supply and she is needing 60mg  not the 30mg . She states she has been on 60mg .

## 2019-12-30 NOTE — Telephone Encounter (Signed)
[name], CMA  

## 2020-01-10 ENCOUNTER — Other Ambulatory Visit: Payer: Self-pay | Admitting: Family Medicine

## 2020-01-10 DIAGNOSIS — J45909 Unspecified asthma, uncomplicated: Secondary | ICD-10-CM

## 2020-02-03 ENCOUNTER — Other Ambulatory Visit: Payer: Self-pay | Admitting: Family Medicine

## 2020-02-03 DIAGNOSIS — F339 Major depressive disorder, recurrent, unspecified: Secondary | ICD-10-CM

## 2020-03-14 ENCOUNTER — Ambulatory Visit: Payer: 59 | Admitting: Family Medicine

## 2020-09-25 ENCOUNTER — Telehealth: Payer: Self-pay

## 2020-09-25 MED ORDER — FLUCONAZOLE 150 MG PO TABS: 150.0000 mg | ORAL_TABLET | Freq: Once | ORAL | 0 refills | Status: AC

## 2020-09-25 NOTE — Telephone Encounter (Signed)
Called patient to discuss symptoms. Pt reports recent abx use. She has itching and white clumpy discharge. Usually [facility] sends Diflucan with refills but she hasn't needed it so rx expired. She is scheduled for her visit in December.

## 2020-09-25 NOTE — Telephone Encounter (Signed)
Pt wanting a refill on the Diflucan Rx sent to the [facility] in [place]

## 2020-10-25 ENCOUNTER — Other Ambulatory Visit: Payer: 59 | Admitting: Adult Health

## 2020-10-30 ENCOUNTER — Encounter: Payer: Self-pay | Admitting: Adult Health

## 2020-10-30 ENCOUNTER — Ambulatory Visit (INDEPENDENT_AMBULATORY_CARE_PROVIDER_SITE_OTHER): Payer: 59 | Admitting: Adult Health

## 2020-10-30 ENCOUNTER — Other Ambulatory Visit: Payer: Self-pay

## 2020-10-30 VITALS — BP 145/90 | HR 103 | Ht 64.0 in | Wt 176.0 lb

## 2020-10-30 DIAGNOSIS — Z01419 Encounter for gynecological examination (general) (routine) without abnormal findings: Secondary | ICD-10-CM | POA: Diagnosis not present

## 2020-10-30 DIAGNOSIS — Z1211 Encounter for screening for malignant neoplasm of colon: Secondary | ICD-10-CM | POA: Diagnosis not present

## 2020-10-30 LAB — HEMOCCULT GUIAC POC 1CARD (OFFICE): Fecal Occult Blood, POC: NEGATIVE

## 2020-10-30 MED ORDER — VALACYCLOVIR HCL 1 G PO TABS: 1000.0000 mg | ORAL_TABLET | Freq: Two times a day (BID) | ORAL | 1 refills | Status: DC

## 2020-10-30 MED ORDER — FLUCONAZOLE 150 MG PO TABS: ORAL_TABLET | ORAL | 6 refills | Status: DC

## 2020-10-30 NOTE — Progress Notes (Signed)
Patient ID: [name], female   DOB: 02-02-1966, 54 y.o.   MRN: [id number] History of Present Illness: [name] is a 54 year old white female, married, sp hysterectomy in for well woman gyn exam. She works from home. She is on prednisone for asthma presently.  PCP is Dr Willey Blade.   Current Medications, Allergies, Past Medical History, Past Surgical History, Family History and Social History were reviewed in [employer] record.     Review of Systems:  Patient denies any headaches, hearing loss, fatigue, blurred vision, shortness of breath, chest pain, abdominal pain, problems with bowel movements, urination, or intercourse. No joint pain or mood swings.   Physical Exam:BP (!) 145/90 (BP Location: Left Arm, Patient Position: Sitting, Cuff Size: Normal)   Pulse (!) 103   Ht 5\' 4"  (1.626 m)   Wt 176 lb (79.8 kg)   BMI 30.21 kg/m  General:  Well developed, well nourished, no acute distress Skin:  Warm and dry Neck:  Midline trachea, normal thyroid, good ROM, no lymphadenopathy Lungs; Clear to auscultation bilaterally Breast:  No dominant palpable mass, retraction, or nipple discharge Cardiovascular: Regular rate and rhythm Abdomen:  Soft, non tender, no hepatosplenomegaly Pelvic:  External genitalia is normal in appearance, no lesions.  The vagina is pale, no lesions seen. Urethra has no lesions or masses. The cervix and uterus are absent. No adnexal masses or tenderness noted.Bladder is non tender, no masses felt. Rectal: Good sphincter tone, no polyps, or hemorrhoids felt.  Hemoccult negative. Extremities/musculoskeletal:  No swelling or varicosities noted, no clubbing or cyanosis Psych:  No mood changes, alert and cooperative,seems happy AA is 3 Fall risk is low PHQ 9 score is 0   [facility] - 10/30/20 1553      Pregnancy Intention Screening   Does the patient want to become pregnant in the next year? N/A    Does the patient's partner want to become pregnant in  the next year? N/A    Would the patient like to discuss contraceptive options today? N/A      Contraception Wrap Up   Current Method No Method - Other Reason   hyst   End Method No Method - Other Reason   hyst   Contraception Counseling Provided No         Examination chaperoned by Tish RN.  Impression and Plan: 1. Encounter for well woman exam with routine gynecological exam GYN physical in 2 years Mammogram yearly Colonoscopy per GI Labs with PCP Will refill diflucan and valtrex at her request   Meds ordered this encounter  Medications  . valACYclovir (VALTREX) 1000 MG tablet    Sig: Take 1 tablet (1,000 mg total) by mouth 2 (two) times daily.    Dispense:  90 tablet    Refill:  1    Order Specific Question:   Supervising Provider    Answer:   Elonda Husky, LUTHER H [[id number]]  . fluconazole (DIFLUCAN) 150 MG tablet    Sig: Take 1 and repeat 1 in 3 days if needed    Dispense:  2 tablet    Refill:  6    Order Specific Question:   Supervising Provider    Answer:   Elonda Husky, LUTHER H [[id number]]    2. Encounter for screening fecal occult blood testing

## 2020-11-30 ENCOUNTER — Other Ambulatory Visit (HOSPITAL_COMMUNITY): Payer: Self-pay | Admitting: Internal Medicine

## 2020-11-30 DIAGNOSIS — Z1231 Encounter for screening mammogram for malignant neoplasm of breast: Secondary | ICD-10-CM

## 2020-12-04 ENCOUNTER — Other Ambulatory Visit: Payer: Self-pay

## 2020-12-04 ENCOUNTER — Ambulatory Visit (HOSPITAL_COMMUNITY)
Admission: RE | Admit: 2020-12-04 | Discharge: 2020-12-04 | Disposition: A | Discharge status: Home / Self Care | Payer: 59 | Source: Ambulatory Visit | Attending: Internal Medicine | Admitting: Internal Medicine

## 2020-12-04 DIAGNOSIS — Z1231 Encounter for screening mammogram for malignant neoplasm of breast: Secondary | ICD-10-CM | POA: Insufficient documentation

## 2021-05-05 ENCOUNTER — Ambulatory Visit
Admission: EM | Admit: 2021-05-05 | Discharge: 2021-05-05 | Disposition: A | Discharge status: Home / Self Care | Payer: 59 | Attending: Family Medicine | Admitting: Family Medicine

## 2021-05-05 ENCOUNTER — Encounter: Payer: Self-pay | Admitting: Emergency Medicine

## 2021-05-05 ENCOUNTER — Other Ambulatory Visit: Payer: Self-pay

## 2021-05-05 DIAGNOSIS — K219 Gastro-esophageal reflux disease without esophagitis: Secondary | ICD-10-CM | POA: Insufficient documentation

## 2021-05-05 DIAGNOSIS — R1030 Lower abdominal pain, unspecified: Secondary | ICD-10-CM | POA: Diagnosis not present

## 2021-05-05 DIAGNOSIS — R109 Unspecified abdominal pain: Secondary | ICD-10-CM | POA: Insufficient documentation

## 2021-05-05 LAB — POCT URINALYSIS DIP (MANUAL ENTRY)
Bilirubin, UA: NEGATIVE
Glucose, UA: NEGATIVE mg/dL
Ketones, POC UA: NEGATIVE mg/dL
Nitrite, UA: NEGATIVE
Protein Ur, POC: NEGATIVE mg/dL
Spec Grav, UA: 1.015 (ref 1.010–1.025)
Urobilinogen, UA: 0.2 E.U./dL
pH, UA: 5 (ref 5.0–8.0)

## 2021-05-05 MED ORDER — KETOROLAC TROMETHAMINE 30 MG/ML IJ SOLN
30.0000 mg | Freq: Once | INTRAMUSCULAR | Status: AC
  Administered 2021-05-05: 30 mg via INTRAMUSCULAR

## 2021-05-05 NOTE — Discharge Instructions (Addendum)
Mikki Santee and Leroy Sea on [address] for pelvic floor stretching  May take muscle relaxer as needed for pain and pulling  May take AZO over the counter  We will culture your urine and will be in contact with any abnormal results  Follow up with this office or with primary care if symptoms are persisting.  Follow up in the ER for high fever, trouble swallowing, trouble breathing, other concerning symptoms.

## 2021-05-05 NOTE — ED Triage Notes (Signed)
Lower abd pain x a few weeks.  States pain got better and now has returned.

## 2021-05-06 LAB — URINE CULTURE: Culture: <10000 — AB

## 2021-05-08 ENCOUNTER — Ambulatory Visit: Payer: 59 | Admitting: Pulmonary Disease

## 2021-05-08 ENCOUNTER — Other Ambulatory Visit (HOSPITAL_COMMUNITY)
Admission: RE | Admit: 2021-05-08 | Discharge: 2021-05-08 | Disposition: A | Discharge status: Home / Self Care | Payer: 59 | Source: Intra-hospital | Attending: Pulmonary Disease | Admitting: Pulmonary Disease

## 2021-05-08 ENCOUNTER — Other Ambulatory Visit: Payer: Self-pay

## 2021-05-08 ENCOUNTER — Encounter: Payer: Self-pay | Admitting: Pulmonary Disease

## 2021-05-08 ENCOUNTER — Ambulatory Visit (HOSPITAL_COMMUNITY)
Admission: RE | Admit: 2021-05-08 | Discharge: 2021-05-08 | Disposition: A | Discharge status: Home / Self Care | Payer: 59 | Source: Ambulatory Visit | Attending: Pulmonary Disease | Admitting: Pulmonary Disease

## 2021-05-08 VITALS — BP 142/90 | HR 98 | Temp 97.3°F | Ht 63.0 in | Wt 184.2 lb

## 2021-05-08 DIAGNOSIS — J4550 Severe persistent asthma, uncomplicated: Secondary | ICD-10-CM | POA: Insufficient documentation

## 2021-05-08 LAB — CBC WITH DIFFERENTIAL/PLATELET
Abs Immature Granulocytes: 0.04 10*3/uL (ref 0.00–0.07)
Basophils Absolute: 0.1 10*3/uL (ref 0.0–0.1)
Basophils Relative: 1 %
Eosinophils Absolute: 0.5 10*3/uL (ref 0.0–0.5)
Eosinophils Relative: 7 %
HCT: 43 % (ref 36.0–46.0)
Hemoglobin: 14.1 g/dL (ref 12.0–15.0)
Immature Granulocytes: 1 %
Lymphocytes Relative: 29 %
Lymphs Abs: 2.3 10*3/uL (ref 0.7–4.0)
MCH: 30.1 pg (ref 26.0–34.0)
MCHC: 32.8 g/dL (ref 30.0–36.0)
MCV: 91.9 fL (ref 80.0–100.0)
Monocytes Absolute: 0.7 10*3/uL (ref 0.1–1.0)
Monocytes Relative: 9 %
Neutro Abs: 4.2 10*3/uL (ref 1.7–7.7)
Neutrophils Relative %: 53 %
Platelets: 294 10*3/uL (ref 150–400)
RBC: 4.68 MIL/uL (ref 3.87–5.11)
RDW: 13 % (ref 11.5–15.5)
WBC: 7.9 10*3/uL (ref 4.0–10.5)
nRBC: 0 % (ref 0.0–0.2)

## 2021-05-08 NOTE — Patient Instructions (Signed)
Will arrange for lab tests, chest xray and pulmonary function test.  Follow up in 7 to 8 weeks.

## 2021-05-08 NOTE — Progress Notes (Signed)
[facility]  Chief Complaint  Patient presents with   Consult    Asthma    Constitutional:  BP (!) 154/106 (BP Location: Left Arm, Cuff Size: Normal)   Pulse 98   Temp (!) 97.3 F (36.3 C) (Temporal)   Ht 5\' 3"  (1.6 m)   Wt 184 lb 3.2 oz (83.6 kg)   SpO2 95% Comment: Room air  BMI 32.63 kg/m   Past Medical History:  Cataract, Cystocele, Rectocele, Depression, GERD s/p Nissen fundoplication, HSV, Migraine headache, RLS, Varicose veins  Past Surgical History:  She  has a past surgical history that includes Septoplasty; Tubal ligation; Abdominal hysterectomy; Replacement total knee bilateral; Nissen fundoplication; Endovenous ablation saphenous vein w/ laser (12-12-2011); Colonoscopy (03/12/2012); Tonsillectomy; Cataract extraction w/PHACO (07/13/2012); Cataract extraction; Cataract extraction w/PHACO (Left, 01/11/2013); Cesarean section; bilateral oopherectomy; Esophagogastroduodenoscopy (N/A, 06/01/2015); and Back surgery.  Brief Summary:  [name] is a 55 y.o. female with minimal smoking history has asthma and allergic rhinitis.  She works as a Marine scientist.      Subjective:   She was seen previously by Dr. Melvyn Novas and Dr. Luan Pulling.  Previously seen by Dr. Ishmael Holter with allergy.  Was on allergy shots previously.  Reactions to dust, mildew, cats.  Allergy shots were not effective.  She has been on prednisone at least 4 times in the past year.  She is on ICS, LABA, and leukotriene inhibitor.  Also using nasal steroid and antihistamine sprays, and zyrtec.  Has dry cough with intermittent wheezing.  Depending on exposures she also gets runny nose.  Has noticed trouble with her breathing when trying to exercise.  No food allergies.  No problem with aspirin.  No history of nasal polyps.  Occasionally gets dry skin which she thinks might be eczema.  Sometimes gets cough and short of breath at night.  Uses albuterol several times per week.  Spring and  Winter are worse time of year for her allergies.  She is from [place].  She is a Marine scientist.  Currently working for [employer] as a Engineer, structural.  She has a Programmer, systems.  She smoked briefly as a teenager.  She had pneumonia in her 39's.  Her son and daughter had asthma - her daughter still needs to use an inhaler.  Labs from 03/23/21: Na 143, K 4.4, Creatinine 0.9, CO2 22, Ca 9.4, AST 22, ALT 20, ALP 75, WBC 7.3, Hb 14.2, PLT 337, Eos 0.6    Physical Exam:   Appearance - well kempt   ENMT - no sinus tenderness, no oral exudate, no LAN, Mallampati 3 airway, no stridor  Respiratory - equal breath sounds bilaterally, no wheezing or rales  CV - s1s2 regular rate and rhythm, no murmurs  Ext - no clubbing, no edema  Skin - no rashes  Psych - normal mood and affect   Pulmonary testing:  PFT 04/07/12 >> FEV1 2.65 (88%), FEV1% 78, TLC 5.03 (95%), DLCO 80%, +BD from FEF 25-75  Chest Imaging:  CT chest 07/22/13 >> scarring in RML/Lingula/LLL, mild areas of BTX  Sleep Tests:  PSG 09/25/12 >> AHI 3.1, SpO2 low 84%   Social History:  She  reports that she quit smoking about 39 years ago. Her smoking use included cigarettes. She has a 0.50 pack-year smoking history. She has never used smokeless tobacco. She reports current alcohol use. She reports that she does not use drugs.  Family History:  Her family history includes Asthma in her child  and maternal grandmother; Cancer in her paternal grandmother; Depression in her mother and sister; Diabetes in her mother; Diabetes type II in her mother; Fibromyalgia in her sister; Heart failure in her father; Hypertension in her brother, father, and sister; Migraines in her sister; Other in her sister.    Discussion:  She has persistent asthma and allergies.  She is on multiple control medications.  She has several exacerbations requiring prednisone in the past year.  She likely would be a good candidate for biologic agent to help control her severe,  persistent asthma.  Assessment/Plan:   Severe persistent asthma. - continue breo, singulair - prn albuterol - will arrange for chest xray, PFT, CBC with diff, and RAST with IgE - depending on test results will determine which biologic agent she would be best suited to try initially  Allergic rhinitis. - continue flonase, azelastine, zyrtec  History of mild regional bronchiectasis. - likely post infectious  - f/u chest xray - bronchial hygiene   Time Spent Involved in Patient Care on Day of Examination:  48 minutes  Follow up:   Patient Instructions  Will arrange for lab tests, chest xray and pulmonary function test.  Follow up in 7 to 8 weeks.  Medication List:   Allergies as of 05/08/2021       Reactions   Hydrocodone Itching   Oxycodone-Itching,    Aminophylline Rash   Nystatin Rash   Penicillins Rash   Sulfamethoxazole-trimethoprim Rash   Pt has taken before but DS causes rash. Single strength does not        Medication List        Accurate as of May 08, 2021  9:48 AM. If you have any questions, ask your nurse or doctor.          STOP taking these medications    cholecalciferol 25 MCG (1000 UNIT) tablet Commonly known as: VITAMIN D3 Stopped by: Chesley Mires, MD   fluconazole 150 MG tablet Commonly known as: DIFLUCAN Stopped by: Chesley Mires, MD       TAKE these medications    albuterol 108 (90 Base) MCG/ACT inhaler Commonly known as: VENTOLIN HFA Inhale 2 puffs into the lungs every 6 (six) hours as needed. For shortness of breath   azelastine 0.1 % nasal spray Commonly known as: ASTELIN 1 spray as needed.   Breo Ellipta 200-25 MCG/INH Aepb Generic drug: fluticasone furoate-vilanterol INHALE 1 PUFF INTO THE LUNGS DAILY   buPROPion 150 MG 24 hr tablet Commonly known as: WELLBUTRIN XL TAKE 3 TABLETS EVERY DAY What changed: See the new instructions.   cetirizine 10 MG tablet Commonly known as: ZYRTEC Take 10 mg by mouth daily.    dexlansoprazole 60 MG capsule Commonly known as: DEXILANT Take 1 capsule (60 mg total) by mouth daily.   fluticasone 50 MCG/ACT nasal spray Commonly known as: FLONASE Place 1 spray into the nose daily.   Lasix 20 MG tablet Generic drug: furosemide Take 20 mg by mouth. Takes 1/2 tablet PRN What changed: Another medication with the same name was removed. Continue taking this medication, and follow the directions you see here. Changed by: Chesley Mires, MD   methocarbamol 500 MG tablet Commonly known as: ROBAXIN   montelukast 10 MG tablet Commonly known as: SINGULAIR Take 10 mg by mouth at bedtime.   rizatriptan 10 MG disintegrating tablet Commonly known as: MAXALT-MLT   valACYclovir 1000 MG tablet Commonly known as: VALTREX Take 1 tablet (1,000 mg total) by mouth 2 (two) times daily.  Xiidra 5 % Soln Generic drug: Lifitegrast        Signature:  Chesley Mires, MD [place] Pager - [phone] 05/08/2021, 9:48 AM

## 2021-05-09 ENCOUNTER — Other Ambulatory Visit: Payer: Self-pay | Admitting: Pulmonary Disease

## 2021-05-11 ENCOUNTER — Other Ambulatory Visit: Payer: Self-pay

## 2021-05-11 ENCOUNTER — Telehealth: Payer: Self-pay | Admitting: Pulmonary Disease

## 2021-05-11 DIAGNOSIS — J4550 Severe persistent asthma, uncomplicated: Secondary | ICD-10-CM

## 2021-05-11 NOTE — Telephone Encounter (Signed)
Patient spoke with [facility] earlier today. Will close encounter.

## 2021-05-12 NOTE — ED Provider Notes (Addendum)
[place]   [id number] 05/05/21 Arrival Time: 1001  CC: ABDOMINAL PAIN  SUBJECTIVE:  [name] is a 55 y.o. female who presents with low abdominal pain intermittently for the last few weeks.  Reports that she has seen gynecology and would like to see them again, but they cannot get her in.  States that the pain got better, and then has returned. Denies a precipitating event, trauma, close contacts with similar symptoms, recent travel or antibiotic use. Reports/Denies abdominal cramping. Has not taken OTC medications for this. Denies alleviating or aggravating factors. Denies similar symptoms in the past. Last BM today.    Denies fever, chills, appetite changes, weight changes, nausea, vomiting, chest pain, SOB, diarrhea, constipation, hematochezia, melena, dysuria, difficulty urinating, increased frequency or urgency, flank pain, loss of bowel or bladder function, vaginal discharge, vaginal odor, vaginal bleeding, dyspareunia.     No LMP recorded. Patient has had a hysterectomy.  ROS: As per HPI.  All other pertinent ROS negative.     Past Medical History:  Diagnosis Date   Allergy    Asthma    dxed at age 70   Cataracts, bilateral    Constipation 10/29/2013   Cystocele 10/29/2013   Depression    GERD (gastroesophageal reflux disease)    Hemorrhoid 01/12/2015   Herpes simplex virus (HSV) infection    Migraine    last migraine:01/06/13   Migraines 03/30/2013   Rectocele 10/29/2013   Restless leg syndrome    Shortness of breath    SOB with exertion   SUI (stress urinary incontinence, female)    Vaginal dryness 04/05/2016   Vaginal pain 04/05/2016   Varicose veins    Past Surgical History:  Procedure Laterality Date   ABDOMINAL HYSTERECTOMY     BACK SURGERY     C5-6 decompression and fusion   bilateral oopherectomy     with posterior repair; perineoplasty   CATARACT EXTRACTION     CATARACT EXTRACTION W/PHACO  07/13/2012   Procedure: CATARACT EXTRACTION PHACO AND  INTRAOCULAR LENS PLACEMENT ([place]);  Surgeon: Williams Che, MD;  Location: [facility];  Service: Ophthalmology;  Laterality: Right;  CDE:1.33   CATARACT EXTRACTION W/PHACO Left 01/11/2013   Procedure: CATARACT EXTRACTION PHACO AND INTRAOCULAR LENS PLACEMENT ([facility]);  Surgeon: Williams Che, MD;  Location: [facility];  Service: Ophthalmology;  Laterality: Left;  CDE 0.00   CESAREAN SECTION     COLONOSCOPY  03/12/2012   Procedure: COLONOSCOPY;  Surgeon: Rogene Houston, MD;  Location: [facility];  Service: Endoscopy;  Laterality: N/A;  1200   ENDOVENOUS ABLATION SAPHENOUS VEIN W/ LASER  12-12-2011   left greater saphenous vein      ESOPHAGOGASTRODUODENOSCOPY N/A 06/01/2015   Procedure: ESOPHAGOGASTRODUODENOSCOPY ([facility]);  Surgeon: Rogene Houston, MD;  Location: [facility];  Service: Endoscopy;  Laterality: N/A;  568   NISSEN FUNDOPLICATION     REPLACEMENT TOTAL KNEE BILATERAL     SEPTOPLASTY     TONSILLECTOMY     TUBAL LIGATION     Allergies  Allergen Reactions   Hydrocodone Itching    Oxycodone-Itching,    Aminophylline Rash   Nystatin Rash   Penicillins Rash   Sulfamethoxazole-Trimethoprim Rash    Pt has taken before but DS causes rash. Single strength does not   No current facility-administered medications on file prior to encounter.   Current Outpatient Medications on File Prior to Encounter  Medication Sig Dispense Refill   albuterol (VENTOLIN HFA) 108 (90 Base) MCG/ACT inhaler  Inhale 2 puffs into the lungs every 6 (six) hours as needed. For shortness of breath     azelastine (ASTELIN) 137 MCG/SPRAY nasal spray 1 spray as needed.      BREO ELLIPTA 200-25 MCG/INH AEPB INHALE 1 PUFF INTO THE LUNGS DAILY 120 each 5   buPROPion (WELLBUTRIN XL) 150 MG 24 hr tablet TAKE 3 TABLETS EVERY DAY (Patient taking differently: 150 mg. 1 tablet bid) 240 tablet 0   dexlansoprazole (DEXILANT) 60 MG capsule Take 1 capsule (60 mg total) by mouth daily. 90 capsule 0   fluticasone (FLONASE) 50 MCG/ACT  nasal spray Place 1 spray into the nose daily.      methocarbamol (ROBAXIN) 500 MG tablet      montelukast (SINGULAIR) 10 MG tablet Take 10 mg by mouth at bedtime.     rizatriptan (MAXALT-MLT) 10 MG disintegrating tablet      valACYclovir (VALTREX) 1000 MG tablet Take 1 tablet (1,000 mg total) by mouth 2 (two) times daily. 90 tablet 1   XIIDRA 5 % SOLN      Social History   Socioeconomic History   Marital status: Married    Spouse name: Not on file   Number of children: Not on file   Years of education: Not on file   Highest education level: Not on file  Occupational History   Occupation: RN    Employer: [employer]  Tobacco Use   Smoking status: Former    Packs/day: 0.25    Years: 2.00    Pack years: 0.50    Types: Cigarettes    Quit date: 07/23/1981    Years since quitting: 39.8   Smokeless tobacco: Never  Vaping Use   Vaping Use: Never used  Substance and Sexual Activity   Alcohol use: Yes    Comment: occassional glass of wine or beer   Drug use: No   Sexual activity: Yes    Birth control/protection: Surgical    Comment: hyst  Other Topics Concern   Not on file  Social History Narrative   Not on file   Social Determinants of Health   Financial Resource Strain: Low Risk    Difficulty of Paying Living Expenses: Not hard at all  Food Insecurity: No Food Insecurity   Worried About Charity fundraiser in the Last Year: Never true   [place] in the Last Year: Never true  Transportation Needs: No Transportation Needs   Lack of Transportation (Medical): No   Lack of Transportation (Non-Medical): No  Physical Activity: Insufficiently Active   Days of Exercise per Week: 1 day   Minutes of Exercise per Session: 30 min  Stress: No Stress Concern Present   Feeling of Stress : Not at all  Social Connections: Moderately Integrated   Frequency of Communication with Friends and Family: More than three times a week   Frequency of Social Gatherings with  Friends and Family: More than three times a week   Attends Religious Services: More than 4 times per year   Active Member of [employer] or Organizations: No   Attends Archivist Meetings: Never   Marital Status: Married  Human resources officer Violence: Not At Risk   Fear of Current or Ex-Partner: No   Emotionally Abused: No   Physically Abused: No   Sexually Abused: No   Family History  Problem Relation Age of Onset   Diabetes Mother    Depression Mother    Diabetes type II Mother  Migraines Sister    Other Sister        migranes   Depression Sister    Fibromyalgia Sister    Hypertension Sister    Hypertension Father    Heart failure Father    Asthma Maternal Grandmother    Hypertension Brother    Cancer Paternal Grandmother    Asthma Child      OBJECTIVE:  Vitals:   05/05/21 1021  BP: 129/85  Pulse: (!) 102  Resp: 16  Temp: 98.8 F (37.1 C)  TempSrc: Oral  SpO2: 96%    General appearance: Alert; NAD HEENT: NCAT.  Oropharynx clear.  Lungs: clear to auscultation bilaterally without adventitious breath sounds Heart: regular rate and rhythm.  Radial pulses 2+ symmetrical bilaterally Abdomen: soft, non-distended; normal active bowel sounds; exquisitely tender in lower abdomen and pelvis; nontender at McBurney's point; negative Murphy's sign; negative rebound; no guarding Back: no CVA tenderness Extremities: no edema; symmetrical with no gross deformities Skin: warm and dry Neurologic: normal gait Psychological: alert and cooperative; normal mood and affect  LABS: No results found for this or any previous visit (from the past 24 hour(s)).  DIAGNOSTIC STUDIES: No results found.   ASSESSMENT & PLAN:  1. Lower abdominal pain   2. Abdominal pressure   3. Gastroesophageal reflux disease without esophagitis     Meds ordered this encounter  Medications   ketorolac (TORADOL) 30 MG/ML injection 30 mg    Toradol 30 mg IM given in office today Discussed that  this could also be uterine/bladder prolapse Has history of having rectocele repaired Urine today shows trace RBCs and small leuks Will culture to rule out infection Will be in contact with any abnormal results that require further treatment Discussed that ultrasound or CT would be a great next step to rule out anything dangerous  Discussed going to the ED today, she declines If you experience new or worsening symptoms return or go to ER such as fever, chills, nausea, vomiting, diarrhea, bloody or dark tarry stools, constipation, urinary symptoms, worsening abdominal discomfort, symptoms that do not improve with medications, inability to keep fluids down.  Reviewed expectations re: course of current medical issues. Questions answered. Outlined signs and symptoms indicating need for more acute intervention. Patient verbalized understanding. After Visit Summary given.   Faustino Congress, NP 05/12/21 [id number]    Faustino Congress, NP 05/12/21 [phone]

## 2021-05-13 LAB — MISC LABCORP TEST (SEND OUT): Labcorp test code: 62497

## 2021-05-22 ENCOUNTER — Encounter: Payer: Self-pay | Admitting: Adult Health

## 2021-05-22 ENCOUNTER — Other Ambulatory Visit: Payer: Self-pay

## 2021-05-22 ENCOUNTER — Ambulatory Visit: Payer: 59 | Admitting: Adult Health

## 2021-05-22 VITALS — BP 122/83 | HR 90 | Ht 64.0 in | Wt 181.0 lb

## 2021-05-22 DIAGNOSIS — R102 Pelvic and perineal pain: Secondary | ICD-10-CM | POA: Diagnosis not present

## 2021-05-22 NOTE — Patient Instructions (Signed)
Kegel Exercises  Kegel exercises can help strengthen your pelvic floor muscles. The pelvic floor is a group of muscles that support your rectum, small intestine, and bladder. In females, pelvic floor muscles also help support the womb (uterus). These muscles help you control the flow of urine and stool. Kegel exercises are painless and simple, and they do not require any equipment. Your provider may suggest Kegel exercises to: Improve bladder and bowel control. Improve sexual response. Improve weak pelvic floor muscles after surgery to remove the uterus (hysterectomy) or pregnancy (females). Improve weak pelvic floor muscles after prostate gland removal or surgery (males). Kegel exercises involve squeezing your pelvic floor muscles, which are the same muscles you squeeze when you try to stop the flow of urine or keep from passing gas. The exercises can be done while sitting, standing, or lying down, but itis best to vary your position. Exercises How to do Kegel exercises: Squeeze your pelvic floor muscles tight. You should feel a tight lift in your rectal area. If you are a female, you should also feel a tightness in your vaginal area. Keep your stomach, buttocks, and legs relaxed. Hold the muscles tight for up to 10 seconds. Breathe normally. Relax your muscles. Repeat as told by your health care provider. Repeat this exercise daily as told by your health care provider. Continue to do this exercise for at least 4-6 weeks, or for as long as told by your healthcare provider. You may be referred to a physical therapist who can help you learn more abouthow to do Kegel exercises. Depending on your condition, your health care provider may recommend: Varying how long you squeeze your muscles. Doing several sets of exercises every day. Doing exercises for several weeks. Making Kegel exercises a part of your regular exercise routine. This information is not intended to replace advice given to you by  your health care provider. Make sure you discuss any questions you have with your healthcare provider. Document Revised: 11/01/2020 Document Reviewed: 07/01/2018 Elsevier Patient Education  2022 Elsevier Inc.  

## 2021-05-22 NOTE — Progress Notes (Signed)
  Subjective:     Patient ID: [name], female   DOB: 1966/04/25, 55 y.o.   MRN: [id number]  HPI [name] is a 55 year old white female, married, sp hysterectomy, in for follow up on pelvic pain, felt like something was falling out, is better now. PCP is Dr Willey Blade.   Review of Systems Had left sided pelvic pain in early June, seen at Urgent Care and given Toradol and AZO and muscle relaxant and then took Zpack she had a home and feels better Denies any problems with urination or bowel movements, but seemed to hurt with sex  Reviewed past medical,surgical, social and family history. Reviewed medications and allergies.     Objective:   Physical Exam BP 122/83 (BP Location: Left Arm, Patient Position: Sitting, Cuff Size: Normal)   Pulse 90   Ht 5\' 4"  (1.626 m)   Wt 181 lb (82.1 kg)   BMI 31.07 kg/m     Skin warm and dry.Pelvic: external genitalia is normal in appearance no lesions, vagina: pale with fair moisture,urethra has no lesions or masses noted, cervix and uterus are absent,adnexa: no masses or tenderness noted. Bladder is non tender and no masses felt. On rectal exam has good support. Examination chaperoned by Celene Squibb LPN  [facility] - 96/29/52 1517       Pregnancy Intention Screening   Does the patient want to become pregnant in the next year? No    Does the patient's partner want to become pregnant in the next year? No    Would the patient like to discuss contraceptive options today? No      Contraception Wrap Up   Current Method Female Sterilization   hyst   End Method Female Sterilization   hyst   Contraception Counseling Provided No             Assessment:     1. Pelvic pain Call me if reoccurs will get CT, I told her I feel this may have been bowel related     Plan:    Can do Kegels to strengthen pelvic muscles  Follow up prn

## 2021-05-25 ENCOUNTER — Other Ambulatory Visit (HOSPITAL_COMMUNITY)
Admission: RE | Admit: 2021-05-25 | Discharge: 2021-05-25 | Disposition: A | Discharge status: Home / Self Care | Payer: 59 | Source: Ambulatory Visit | Attending: Pulmonary Disease | Admitting: Pulmonary Disease

## 2021-05-25 ENCOUNTER — Other Ambulatory Visit: Payer: Self-pay

## 2021-05-25 DIAGNOSIS — Z01812 Encounter for preprocedural laboratory examination: Secondary | ICD-10-CM | POA: Insufficient documentation

## 2021-05-25 DIAGNOSIS — Z20822 Contact with and (suspected) exposure to covid-19: Secondary | ICD-10-CM | POA: Diagnosis not present

## 2021-05-25 LAB — SARS CORONAVIRUS 2 (TAT 6-24 HRS): SARS Coronavirus 2: NEGATIVE

## 2021-05-29 ENCOUNTER — Other Ambulatory Visit: Payer: Self-pay

## 2021-05-29 ENCOUNTER — Ambulatory Visit (HOSPITAL_COMMUNITY)
Admission: RE | Admit: 2021-05-29 | Discharge: 2021-05-29 | Disposition: A | Discharge status: Home / Self Care | Payer: 59 | Source: Ambulatory Visit | Attending: Pulmonary Disease | Admitting: Pulmonary Disease

## 2021-05-29 DIAGNOSIS — J4550 Severe persistent asthma, uncomplicated: Secondary | ICD-10-CM

## 2021-05-29 LAB — PULMONARY FUNCTION TEST
DL/VA % pred: 93 %
DL/VA: 3.95 ml/min/mmHg/L
DLCO unc % pred: 77 %
DLCO unc: 15.88 ml/min/mmHg
FEF 25-75 Post: 1.66 L/sec
FEF 25-75 Pre: 1.33 L/sec
FEF2575-%Change-Post: 25 %
FEF2575-%Pred-Post: 67 %
FEF2575-%Pred-Pre: 53 %
FEV1-%Change-Post: 4 %
FEV1-%Pred-Post: 80 %
FEV1-%Pred-Pre: 77 %
FEV1-Post: 2.11 L
FEV1-Pre: 2.03 L
FEV1FVC-%Change-Post: 7 %
FEV1FVC-%Pred-Pre: 92 %
FEV6-%Change-Post: 0 %
FEV6-%Pred-Post: 82 %
FEV6-%Pred-Pre: 83 %
FEV6-Post: 2.7 L
FEV6-Pre: 2.72 L
FEV6FVC-%Change-Post: 1 %
FEV6FVC-%Pred-Post: 102 %
FEV6FVC-%Pred-Pre: 100 %
FVC-%Change-Post: -2 %
FVC-%Pred-Post: 80 %
FVC-%Pred-Pre: 82 %
FVC-Post: 2.71 L
FVC-Pre: 2.79 L
Post FEV1/FVC ratio: 78 %
Post FEV6/FVC ratio: 99 %
Pre FEV1/FVC ratio: 73 %
Pre FEV6/FVC Ratio: 97 %
RV % pred: 219 %
RV: 4.23 L
TLC % pred: 139 %
TLC: 7.04 L

## 2021-05-29 MED ORDER — ALBUTEROL SULFATE (2.5 MG/3ML) 0.083% IN NEBU
2.5000 mg | INHALATION_SOLUTION | Freq: Once | RESPIRATORY_TRACT | Status: AC
  Administered 2021-05-29: 2.5 mg via RESPIRATORY_TRACT

## 2021-05-29 MED ORDER — ALBUTEROL SULFATE HFA 108 (90 BASE) MCG/ACT IN AERS: 2.0000 | INHALATION_SPRAY | Freq: Four times a day (QID) | RESPIRATORY_TRACT | 3 refills | Status: DC | PRN

## 2021-06-21 ENCOUNTER — Telehealth: Payer: Self-pay | Admitting: Pulmonary Disease

## 2021-06-21 ENCOUNTER — Other Ambulatory Visit: Payer: Self-pay | Admitting: Adult Health

## 2021-06-21 MED ORDER — NITROFURANTOIN MONOHYD MACRO 100 MG PO CAPS: 100.0000 mg | ORAL_CAPSULE | Freq: Two times a day (BID) | ORAL | 0 refills | Status: AC

## 2021-06-21 NOTE — Progress Notes (Signed)
Rx macrobid  

## 2021-06-21 NOTE — Telephone Encounter (Signed)
Called and spoke with patient who is calling because she has an appt next week with Dr. Halford Chessman and stated they would be discussing possible start of biologics and she was wondering if an injection would be given that day. Informed her that if they decided to start a biologic they would do the paperwork at the appointment and we would then get it over to our pharmacy team and they would start the process and then touch base with her. Informed her that she would have to go to [facility] for her injections once she started them as we do not do them here at the office. She expressed understanding. Nothing further needed at this time.

## 2021-06-28 ENCOUNTER — Encounter: Payer: Self-pay | Admitting: Pulmonary Disease

## 2021-06-28 ENCOUNTER — Other Ambulatory Visit: Payer: Self-pay

## 2021-06-28 ENCOUNTER — Ambulatory Visit: Payer: 59 | Admitting: Pulmonary Disease

## 2021-06-28 VITALS — BP 142/102 | HR 96 | Temp 97.9°F | Ht 64.0 in | Wt 183.0 lb

## 2021-06-28 DIAGNOSIS — J4550 Severe persistent asthma, uncomplicated: Secondary | ICD-10-CM | POA: Diagnosis not present

## 2021-06-28 NOTE — Patient Instructions (Signed)
Will start the approval process for Fasenra   Follow up in 3 months

## 2021-06-28 NOTE — Progress Notes (Signed)
[facility]  Chief Complaint  Patient presents with   Follow-up    Patient is here to discuss possible biologics for asthma. Did PFT on 05/29/21. No concerns at this time     Constitutional:  BP (!) 142/102 (BP Location: Left Arm, Patient Position: Sitting, Cuff Size: Normal)   Pulse 96   Temp 97.9 F (36.6 C) (Oral)   Ht '5\' 4"'$  (1.626 m)   Wt 183 lb (83 kg)   SpO2 97%   BMI 31.41 kg/m   Past Medical History:  Cataract, Cystocele, Rectocele, Depression, GERD s/p Nissen fundoplication, HSV, Migraine headache, RLS, Varicose veins  Past Surgical History:  She  has a past surgical history that includes Septoplasty; Tubal ligation; Abdominal hysterectomy; Replacement total knee bilateral; Nissen fundoplication; Endovenous ablation saphenous vein w/ laser (12-12-2011); Colonoscopy (03/12/2012); Tonsillectomy; Cataract extraction w/PHACO (07/13/2012); Cataract extraction; Cataract extraction w/PHACO (Left, 01/11/2013); Cesarean section; bilateral oopherectomy; Esophagogastroduodenoscopy (N/A, 06/01/2015); and Back surgery.  Brief Summary:  [name] is a 55 y.o. female with minimal smoking history has asthma with eosinophilic phenotype and chronic rhinitis.  She works as a Marine scientist.      Subjective:   PFT showed mild obstruction, air trapping, and mild diffusion defect.  CBC from 05/08/21 showed eosinophils at 500.  Chest xray from 05/09/21 was normal.  She need prednisone again since I last saw her for asthma flare up (making 5th time on prednisone in last year).  Her blood pressure has been running high since she was on prednisone.  Not having cough, wheeze, chest congestion since being on prednisone.   Physical Exam:   Appearance - well kempt   ENMT - no sinus tenderness, no oral exudate, no LAN, Mallampati 3 airway, no stridor  Respiratory - equal breath sounds bilaterally, no wheezing or rales  CV - s1s2 regular rate and rhythm, no  murmurs  Ext - no clubbing, no edema  Skin - no rashes  Psych - normal mood and affect    Pulmonary testing:  PFT 04/07/12 >> FEV1 2.65 (88%), FEV1% 78, TLC 5.03 (95%), DLCO 80%, +BD from FEF 25-75 PFT 05/29/21 >> FEV1 2.11 (80%), FEV1% 78, TLC 7.04 (139%), DLCO 77%  Chest Imaging:  CT chest 07/22/13 >> scarring in RML/Lingula/LLL, mild areas of BTX  Sleep Tests:  PSG 09/25/12 >> AHI 3.1, SpO2 low 84%   Social History:  She  reports that she quit smoking about 39 years ago. Her smoking use included cigarettes. She has a 0.50 pack-year smoking history. She has never used smokeless tobacco. She reports current alcohol use. She reports that she does not use drugs.  Family History:  Her family history includes Asthma in her child and maternal grandmother; Cancer in her paternal grandmother; Depression in her mother and sister; Diabetes in her mother; Diabetes type II in her mother; Fibromyalgia in her sister; Heart failure in her father; Hypertension in her brother, father, and sister; Migraines in her sister; Other in her sister.    Discussion:  She has severe persistent asthma with eosinophilic phenotype with eosinophil level on CBC from 05/08/21 at 500.  She has been on prednisone 5 times in the past year for asthma exacerbations. She is on multiple maintenance therapies for asthma (high dose breo, montelukast).  I think she is an excellent candidate for benralizumab Berna Bue).  Assessment/Plan:   Severe persistent asthma with eosinophilic phenotype. - will start approval process for benralizumab (fasenra) - continue high dose breo and  singulair - prn albuterol  Allergic rhinitis. - reactions to dust, mildew, cats - previously tried allergy injections, but ineffective in controlling symptoms - continue flonase, azelastine, zyrtec, singulair   History of mild regional bronchiectasis. - likely post infectious  - bronchial hygiene  Hypertension. - more recent blood pressure  elevation might be related to recent prednisone use - she has follow up with PCP in one week to further assess  Time Spent Involved in Patient Care on Day of Examination:  34 minutes  Follow up:   Patient Instructions  Will start the approval process for Fasenra   Follow up in 3 months  Medication List:   Allergies as of 06/28/2021       Reactions   Hydrocodone Itching   Oxycodone-Itching,    Aminophylline Rash   Nystatin Rash   Penicillins Rash   Sulfamethoxazole-trimethoprim Rash   Pt has taken before but DS causes rash. Single strength does not        Medication List        Accurate as of June 28, 2021  9:30 AM. If you have any questions, ask your nurse or doctor.          albuterol 108 (90 Base) MCG/ACT inhaler Commonly known as: VENTOLIN HFA Inhale 2 puffs into the lungs every 6 (six) hours as needed. For shortness of breath   azelastine 0.1 % nasal spray Commonly known as: ASTELIN 1 spray as needed.   Breo Ellipta 200-25 MCG/INH Aepb Generic drug: fluticasone furoate-vilanterol INHALE 1 PUFF INTO THE LUNGS DAILY   buPROPion 150 MG 24 hr tablet Commonly known as: WELLBUTRIN XL TAKE 3 TABLETS EVERY DAY What changed: See the new instructions.   cetirizine 10 MG tablet Commonly known as: ZYRTEC Take 10 mg by mouth daily.   dexlansoprazole 60 MG capsule Commonly known as: DEXILANT Take 1 capsule (60 mg total) by mouth daily.   fluticasone 50 MCG/ACT nasal spray Commonly known as: FLONASE Place 1 spray into the nose daily.   furosemide 20 MG tablet Commonly known as: LASIX Take 20 mg by mouth. Takes 1/2 tablet PRN   methocarbamol 500 MG tablet Commonly known as: ROBAXIN   montelukast 10 MG tablet Commonly known as: SINGULAIR Take 10 mg by mouth at bedtime.   nitrofurantoin (macrocrystal-monohydrate) 100 MG capsule Commonly known as: Macrobid Take 1 capsule (100 mg total) by mouth 2 (two) times daily for 7 days.   rizatriptan 10 MG  disintegrating tablet Commonly known as: MAXALT-MLT   valACYclovir 1000 MG tablet Commonly known as: VALTREX Take 1 tablet (1,000 mg total) by mouth 2 (two) times daily. What changed:  how much to take when to take this reasons to take this   Xiidra 5 % Soln Generic drug: Lifitegrast        Signature:  Chesley Mires, MD [place] Pager - [phone] 06/28/2021, 9:30 AM

## 2021-07-17 ENCOUNTER — Telehealth: Payer: Self-pay

## 2021-07-17 DIAGNOSIS — J4550 Severe persistent asthma, uncomplicated: Secondary | ICD-10-CM

## 2021-07-17 NOTE — Telephone Encounter (Signed)
Received signed provider portion for [employer] Access 360 paperwork. Will work on obtaining patient portion.  Submitted a Prior Authorization request to [facility] for [facility] via CoverMyMeds. Will update once we receive a response.   Key: [id number]

## 2021-07-18 ENCOUNTER — Other Ambulatory Visit (HOSPITAL_COMMUNITY): Payer: Self-pay

## 2021-07-18 NOTE — Telephone Encounter (Signed)
Submitted Patient Assistance Application to  [place]  for [facility] along with provider portion, PA and income documents. Will update patient when we receive a response.  Fax# [phone] Phone# [phone]

## 2021-07-18 NOTE — Telephone Encounter (Signed)
Received notification from [facility] regarding a prior authorization for [facility]. Authorization has been APPROVED from 07/18/2021 to 07/18/2023.   Per test claim, copay for initial loading dose is $1,291.03  Patient can fill through [facility]: [phone]   Authorization # [id number] Phone # [phone]   Signed patient and provider forms on hand. Will await faxed confirmation of PA Approval before sending in PAP paperwork.

## 2021-07-20 NOTE — Telephone Encounter (Signed)
Received faxed verification that application has been received. A rep will be reaching out regarding next steps once the initial request is completed.  Access 360 Patient ID: [id number]

## 2021-07-24 ENCOUNTER — Other Ambulatory Visit (HOSPITAL_COMMUNITY): Payer: Self-pay

## 2021-07-24 MED ORDER — FASENRA PEN 30 MG/ML ~~LOC~~ SOAJ
SUBCUTANEOUS | 0 refills | Status: DC
  Filled 2021-07-24: qty 1, 28d supply, fill #0

## 2021-07-24 NOTE — Telephone Encounter (Signed)
Patient scheduled for Fasenra new start on 07/26/21. Spoke with patient, nothing further needed.  Knox Saliva, PharmD, MPH, BCPS Clinical Pharmacist (Rheumatology and Pulmonology)

## 2021-07-24 NOTE — Telephone Encounter (Signed)
Devki, please see pt email and respond to pt, thanks!!  Manning Charity F  P [facility] (supporting Chesley Mires, MD) 6 hours ago (9:32 AM)   Hello, I got a letter from my insurance saying they have provided reauthorization for me to get the injections.  Will your office be reaching out soon to schedule the first one?  I know I will have to come to [facility] for it and want make sure I can the time off I will need. Thank you,

## 2021-07-24 NOTE — Telephone Encounter (Signed)
Received fax from [place] stating that patient has been successfully enrolled into the [facility], pt is eligible for $0 copay and $100 per injection for administration costs up to $13,000 per calendar year. Copay card added in to [facility] and fax sent to scan center.  Copay Card info: RxBIN [id number] RxPCN [id number] [id number] ID [id number]  Routing to [facility] to proceed with

## 2021-07-24 NOTE — Telephone Encounter (Signed)
Patient scheduled for Berna Bue new start on 07/26/21  Rx sent to [facility] to be couriered to clinic prior to appt  Routing to [place] to help assist with coordination to clinic.  Knox Saliva, PharmD, MPH, BCPS Clinical Pharmacist (Rheumatology and Pulmonology)

## 2021-07-25 ENCOUNTER — Other Ambulatory Visit (HOSPITAL_COMMUNITY): Payer: Self-pay

## 2021-07-25 NOTE — Telephone Encounter (Signed)
[facility] updated with instructions to ship to [facility] clinic by 9/1 if possible, if not then [facility] will personally pick up. Sent email to Specialty Pharmacy pool also explaining situation. Rx processed in [facility] with $0 copay.

## 2021-07-26 ENCOUNTER — Other Ambulatory Visit: Payer: Self-pay

## 2021-07-26 ENCOUNTER — Ambulatory Visit: Payer: 59 | Admitting: Pharmacist

## 2021-07-26 ENCOUNTER — Other Ambulatory Visit (HOSPITAL_COMMUNITY): Payer: Self-pay

## 2021-07-26 DIAGNOSIS — Z79899 Other long term (current) drug therapy: Secondary | ICD-10-CM

## 2021-07-26 DIAGNOSIS — Z7189 Other specified counseling: Secondary | ICD-10-CM

## 2021-07-26 DIAGNOSIS — J4550 Severe persistent asthma, uncomplicated: Secondary | ICD-10-CM

## 2021-07-26 MED ORDER — FASENRA PEN 30 MG/ML ~~LOC~~ SOAJ
30.0000 mg | SUBCUTANEOUS | 1 refills | Status: DC
Start: 2021-11-15 — End: 2021-08-09
  Filled 2021-07-26: qty 1, 56d supply, fill #0

## 2021-07-26 MED ORDER — FASENRA PEN 30 MG/ML ~~LOC~~ SOAJ
SUBCUTANEOUS | 1 refills | Status: DC
  Filled 2021-07-26: qty 1, fill #0

## 2021-07-26 NOTE — Patient Instructions (Addendum)
Your next Fasenra dose is due on 9/29, 10/27, and every 8 weeks thereafter (starting 12/22)  [place]. CONTINUE Flonase CONTINUE montelukast (Singulair)  Your prescription will be shipped from [facility]. Their phone number is [phone] They will call to schedule shipment and confirm address in about 2-3 weeks. They will mail your medication to your home in a cooler. Store the medication in your fridge!  Your copay should be affordable. If you call the pharmacy and it is not affordable, please double-check that they are billing through your copay card as secondary coverage. That copay card information is: RxBIN [id number] RxPCN [id number] [id number] ID [id number]  You will need to be seen by your provider in 3 to 4 months to assess how Berna Bue is working for you. Please ensure you have a follow-up appointment scheduled with Dr. Halford Chessman. Call our clinic if you need to make this appointment.  How to manage an injection site reaction: Remember the 5 C's: COUNTER - leave on the counter at least 30 minutes but up to overnight to bring medication to room temperature. This may help prevent stinging COLD - place something cold (like an ice gel pack or cold water bottle) on the injection site just before cleansing with alcohol. This may help reduce pain CLARITIN - use Claritin (generic name is loratadine) for the first two weeks of treatment or the day of, the day before, and the day after injecting. This will help to minimize injection site reactions CORTISONE CREAM - apply if injection site is irritated and itching CALL ME - if injection site reaction is bigger than the size of your fist, looks infected, blisters, or if you develop hives   Anaphylactic Reactions  Anaphylactic reactions can occur up to 24 hours after administration.  What are the signs and symptoms of anaphylaxis?  Symptoms can vary from a mild skin reaction to more severe reactions  including: Wheezing, shortness of breath, cough, chest tightness or trouble breathing Low blood pressure, dizziness, fainting, rapid of weak heartbeat, anxiety, or feeling of "impending doom" Flushing, itching, hives, or feeling warm Swelling of the throat or tongue, throat tightness, hoarse voice, or trouble swallowing  Some of these symptoms require immediate treatment, as they can be life threatening.  If you experience severe symptoms which are bolded above use your Epipen and call 911 for immediate emergency care

## 2021-07-26 NOTE — Progress Notes (Signed)
HPI Patient presents today to [facility] to see pharmacy team for [facility] new start.  Past medical history includes: eosinophilic phenotypic asthma and chronic rhinitis, GERD, history of depression, migraines, cataracts, RLS. 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  She works remotely for work and yet continues to have uncontrolled asthma.  She has needed 3 prednisone tapers this year. She reports that she has been using rescue inhaler more frequently.  Respiratory Medications Current regimen: Breo Ellipta 200/25 mcg (1 puff once daily), montelukast '10mg'$  nightly, Flonase nasal spray once daily   Patient reports no known adherence challenges. Pays close to $200 for 3 month supply of her inhaler  OBJECTIVE Allergies  Allergen Reactions   Hydrocodone Itching    Oxycodone-Itching,    Aminophylline Rash   Nystatin Rash   Penicillins Rash   Sulfamethoxazole-Trimethoprim Rash    Pt has taken before but DS causes rash. Single strength does not    Outpatient Encounter Medications as of 07/26/2021  Medication Sig   albuterol (VENTOLIN HFA) 108 (90 Base) MCG/ACT inhaler Inhale 2 puffs into the lungs every 6 (six) hours as needed. For shortness of breath   azelastine (ASTELIN) 137 MCG/SPRAY nasal spray 1 spray as needed.    Benralizumab (FASENRA PEN) 30 MG/ML SOAJ Inject '30mg'$  into the skin at Week 0.  Deliver to pulm. Appt is on 07/26/21   BREO ELLIPTA 200-25 MCG/INH AEPB INHALE 1 PUFF INTO THE LUNGS DAILY   buPROPion (WELLBUTRIN XL) 150 MG 24 hr tablet TAKE 3 TABLETS EVERY DAY (Patient taking differently: 150 mg. 1 tablet bid)   cetirizine (ZYRTEC) 10 MG tablet Take 10 mg by mouth daily.   dexlansoprazole (DEXILANT) 60 MG capsule Take 1 capsule (60 mg total) by mouth daily.   fluticasone (FLONASE) 50 MCG/ACT nasal spray Place 1 spray into the nose daily.    furosemide (LASIX) 20 MG tablet Take 20 mg by mouth. Takes 1/2 tablet PRN    methocarbamol (ROBAXIN) 500 MG tablet    montelukast (SINGULAIR) 10 MG tablet Take 10 mg by mouth at bedtime.   rizatriptan (MAXALT-MLT) 10 MG disintegrating tablet    valACYclovir (VALTREX) 1000 MG tablet Take 1 tablet (1,000 mg total) by mouth 2 (two) times daily. (Patient taking differently: Take 500 mg by mouth as needed.)   XIIDRA 5 % SOLN    No facility-administered encounter medications on file as of 07/26/2021.     Immunization History  Administered Date(s) Administered   Influenza Split 08/25/2013   Influenza,inj,Quad PF,6+ Mos 09/19/2018   Moderna Sars-Covid-2 Vaccination 12/21/2019, 01/18/2020, 10/13/2020, 06/03/2021   Pneumococcal Conjugate-13 08/31/2015   Zoster Recombinat (Shingrix) 09/19/2018, 01/09/2019     PFTs PFT Results Latest Ref Rng & Units 05/29/2021  FVC-Pre L 2.79  FVC-Predicted Pre % 82  FVC-Post L 2.71  FVC-Predicted Post % 80  Pre FEV1/FVC % % 73  Post FEV1/FCV % % 78  FEV1-Pre L 2.03  FEV1-Predicted Pre % 77  FEV1-Post L 2.11  DLCO uncorrected ml/min/mmHg 15.88  DLCO UNC% % 77  DLVA Predicted % 93  TLC L 7.04  TLC % Predicted % 139  RV % Predicted % 219     Eosinophils Most recent blood eosinophil count was 500 cells/microL taken on 05/08/21.   IgE: not drawn   Assessment   Biologics training for benralizumab Berna Bue)  Goals of therapy: Mechanism of action: humanized afucosylated, monoclonal antibody (IgG1, kappa) that binds to the alpha subunit of  the interleukin-5 receptor. IL-5 is the major cytokine responsible for the growth and differentiation, recruitment, activation, and survival of eosinophils (a cell type associated with inflammation and an important component in the pathogenesis of asthma) Reviewed that Berna Bue is add-on medication and patient must continue maintenance inhaler regimen. Response to therapy: may take 4 months to determine efficacy. Discussed that patients generally feel improvement sooner than 4 months.  Side  effects: antibody development (13%), headache (8%), pharyngitis (5%), injection site reaction (2.2%)  Dose: 30 mg subcutaneously at Week 0, Week 4, Week 8, then '30mg'$  every 8 weeks thereafter  Administration/Storage:   Reviewed administration sites of thigh or abdomen (at least 2-3 inches away from abdomen). Reviewed the upper arm is only appropriate if caregiver is administering injection  Do not shake the pen/syringe as this could lead to product foaming or precipitation. \ Reviewed storage of medication in refrigerator. Reviewed that Berna Bue can be stored at room temperature in unopened carton for up to 14 days.  Side effects: headache (19%), injection site reaction (7-15%), antibody development (6%), backache (5%), fatigue (5%)  Access: Approval of Fasenra through: insurance Patient enrolled into copay card program to help with copay assistance.  Patient self-administered in the right thigh using: Name: Fasenra '30mg'$ /mL autoinjector pen [place]: [id number] Lot: [id number] Exp: 01/2023  She was monitored for 1 hour. No adverse reaction or concerns from patients.  Patient is aware of signs and symptoms of anaphylactic reaction and how to manage  Medication Reconciliation  A drug regimen assessment was performed, including review of allergies, interactions, disease-state management, dosing and immunization history. Medications were reviewed with the patient, including name, instructions, indication, goals of therapy, potential side effects, importance of adherence, and safe use.  Drug interaction(s): none noted  Medication list reviewed for any changes and additions  Immunizations  Patient is indicated for the influenzae, pneumonia, and shingles vaccinations (UTD - can receive influenza vaccine now) Patient has received 4 COVID19 vaccines. She states she is UTD on Tdap, but has to find immunization record. She has been advised to bring record to office visit with Dr.  Halford Chessman.  PLAN Continue Fasenra '30mg'$  at Week 4 on 9/29 and Week 8 on 10/27. Then continue Fasenra '30mg'$  every 8 weeks thereafter starting on 12/22. Rx sent to: [facility]: [phone] . Patient provided with pharmacy phone number and advised that pharmacy will call to schedule shipment to home in approximately 2-3 weeks. Patient provided with copay card information to provide to pharmacy if quoted copay exceeds $5 per month. Continue maintenance asthma regimen of: Breo Ellipta 200/25 mcg (1 puff once daily), montelukast '10mg'$  nightly, Flonase nasal spray once daily  F/u with Dr. Halford Chessman in 3-4 months at [facility] office to assess how effective Berna Bue is.  All questions encouraged and answered.  Instructed patient to reach out with any further questions or concerns.  Thank you for allowing pharmacy to participate in this patient's care.  This appointment required 90 minutes of patient care (this includes precharting, chart review, review of results, face-to-face care, etc.).   Knox Saliva, PharmD, MPH, BCPS Clinical Pharmacist (Rheumatology and Pulmonology)

## 2021-08-01 ENCOUNTER — Telehealth: Payer: Self-pay | Admitting: Pharmacist

## 2021-08-01 NOTE — Telephone Encounter (Signed)
Received call from patient who states that starting on Monday, 07/30/21, she started having itching on her back. She received her first Fasenra dose in clinic on 07/26/21 and was monitored for 1 hour without issue. She states she has a rash on her back but was out in the woods on Sunday and may have been exposed to a bug or plant outdoors and was unaware.  She has some very small raised bumps and itching on legs and back but not throughout body. She states that it is not hives. No reaction or concerns at the injection site itself.  She has tried using diphenhydramine cream. I suggested icing area liberally and taking oral diphenhydramine tonight. She has been advised that diphenhydramine will cause drowsiness and to take when she will not be driving or operating machinery.  She is amenable to taking second dose if the reaction resolves within the next few days. For her second dose, I recommended she take loratadine the day before her injection, the day of her injection, and the day after her injection. However if rash reoccurs, then we will likely have to switch therapies.  She is aware to reach back out to me if the rash worsens. She has my direct office number.  Knox Saliva, PharmD, MPH, BCPS Clinical Pharmacist (Rheumatology and Pulmonology)

## 2021-08-02 DIAGNOSIS — J45909 Unspecified asthma, uncomplicated: Secondary | ICD-10-CM

## 2021-08-02 MED ORDER — FLUTICASONE FUROATE-VILANTEROL 200-25 MCG/INH IN AEPB: 1.0000 | INHALATION_SPRAY | Freq: Every day | RESPIRATORY_TRACT | 3 refills | Status: DC

## 2021-08-02 NOTE — Telephone Encounter (Signed)
Noted  

## 2021-08-09 ENCOUNTER — Other Ambulatory Visit (HOSPITAL_COMMUNITY): Payer: Self-pay

## 2021-08-09 ENCOUNTER — Other Ambulatory Visit: Payer: Self-pay | Admitting: Pharmacist

## 2021-08-09 DIAGNOSIS — J4550 Severe persistent asthma, uncomplicated: Secondary | ICD-10-CM

## 2021-08-09 MED ORDER — FASENRA PEN 30 MG/ML ~~LOC~~ SOAJ: 30.0000 mg | SUBCUTANEOUS | 1 refills | Status: DC

## 2021-08-09 MED ORDER — FASENRA PEN 30 MG/ML ~~LOC~~ SOAJ: SUBCUTANEOUS | 1 refills | Status: DC

## 2021-08-09 NOTE — Telephone Encounter (Signed)
Received call from patient regarding [name] stating that [facility] called her to schedule shipment of Fasenra. Unsure how they got prescription. Ran test claim for [name] today and it seems she only has 2 retail (non [employer] fills).   In regards to redness and itchiness that she had reported during previous call, she states that it has been improving and believes it may have been something outside.  Rx for Week 4, Week 8 loading dose sent to [facility] as well as separate rx for maintenance rx  Knox Saliva, PharmD, MPH, BCPS Clinical Pharmacist (Rheumatology and Pulmonology)

## 2021-08-20 NOTE — Telephone Encounter (Signed)
Received notification from [employer] that patient's Fasenra shipment is scheduled to ship from [place] on 08/16/21  Knox Saliva, PharmD, MPH, BCPS Clinical Pharmacist (Rheumatology and Pulmonology)

## 2021-08-25 ENCOUNTER — Other Ambulatory Visit: Payer: Self-pay | Admitting: Adult Health

## 2021-08-27 ENCOUNTER — Telehealth: Payer: Self-pay | Admitting: Orthopedic Surgery

## 2021-08-27 MED ORDER — CLINDAMYCIN HCL 150 MG PO CAPS: 600.0000 mg | ORAL_CAPSULE | Freq: Once | ORAL | 2 refills | Status: AC

## 2021-08-27 NOTE — Telephone Encounter (Signed)
Call/voice message from patient, with question about her upcoming dental appointment Thursday, 08/30/21. Due to patient's history of bilateral knee replacement surgeries, she is asking if Dr Aline Brochure would need to again order pre-dental antibiotic. Last had ordered:  (CLEOCIN) 150 MG capsule 4 capsule       If so, [place]  - please advise - patient's ph# [phone]

## 2021-08-30 ENCOUNTER — Other Ambulatory Visit: Payer: Self-pay | Admitting: Adult Health

## 2021-09-13 ENCOUNTER — Other Ambulatory Visit: Payer: Self-pay

## 2021-09-13 MED ORDER — PREDNISONE 10 MG PO TABS: ORAL_TABLET | ORAL | 0 refills | Status: AC

## 2021-09-13 NOTE — Telephone Encounter (Addendum)
Primary Pulmonologist: Sood  Last office visit and with whom: 06/28/2021 What do we see them for (pulmonary problems): Asthma Last OV assessment/plan: see below   Was appointment offered to patient (explain)?  No    Reason for call:   Hello.  I'm just reaching out because, with the weather change we have had I'm having more SOB and wheezing and having to use my rescue inhaler more frequently.  Was wondering what I should do.  A short course of steroids will normally correct it. But I do not have any and wanted to get your thoughts.  Patient is more SOB with exertion than usual and wheezing. Has not tried anything over the counter. Has used nebulizer.   Dr. Halford Chessman is off today. Dr Annamaria Boots please advise      Allergies  Allergen Reactions   Hydrocodone Itching    Oxycodone-Itching,    Aminophylline Rash   Nystatin Rash   Penicillins Rash   Sulfamethoxazole-Trimethoprim Rash    Pt has taken before but DS causes rash. Single strength does not    Immunization History  Administered Date(s) Administered   Influenza Split 08/25/2013   Influenza,inj,Quad PF,6+ Mos 09/19/2018   Moderna Sars-Covid-2 Vaccination 12/21/2019, 01/18/2020, 10/13/2020, 06/03/2021   Pneumococcal Conjugate-13 08/31/2015   Zoster Recombinat (Shingrix) 09/19/2018, 01/09/2019    Assessment/Plan:    Severe persistent asthma with eosinophilic phenotype. - will start approval process for benralizumab (fasenra) - continue high dose breo and singulair - prn albuterol   Allergic rhinitis. - reactions to dust, mildew, cats - previously tried allergy injections, but ineffective in controlling symptoms - continue flonase, azelastine, zyrtec, singulair    History of mild regional bronchiectasis. - likely post infectious  - bronchial hygiene   Hypertension. - more recent blood pressure elevation might be related to recent prednisone use - she has follow up with PCP in one week to further assess

## 2021-09-13 NOTE — Telephone Encounter (Signed)
Prednisone 10 mg, # 20, 4 X 2 DAYS, 3 X 2 DAYS, 2 X 2 DAYS, 1 X 2 DAYS  

## 2021-10-23 ENCOUNTER — Other Ambulatory Visit: Payer: Self-pay | Admitting: Adult Health

## 2021-11-28 ENCOUNTER — Encounter: Payer: Self-pay | Admitting: Pulmonary Disease

## 2021-11-28 ENCOUNTER — Ambulatory Visit: Payer: 59 | Admitting: Pulmonary Disease

## 2021-11-28 ENCOUNTER — Other Ambulatory Visit: Payer: Self-pay

## 2021-11-28 VITALS — BP 130/90 | HR 78 | Temp 98.2°F | Ht 64.0 in | Wt 187.0 lb

## 2021-11-28 DIAGNOSIS — J4550 Severe persistent asthma, uncomplicated: Secondary | ICD-10-CM

## 2021-11-28 MED ORDER — ALBUTEROL SULFATE (2.5 MG/3ML) 0.083% IN NEBU: 2.5000 mg | INHALATION_SOLUTION | Freq: Four times a day (QID) | RESPIRATORY_TRACT | 5 refills | Status: DC | PRN

## 2021-11-28 MED ORDER — PREDNISONE 10 MG PO TABS: ORAL_TABLET | ORAL | 0 refills | Status: AC

## 2021-11-28 NOTE — Patient Instructions (Signed)
You can try stepping down your asthma and allergy medications in the following sequence: azelastine, flonase, zyrtec, singulair.  Give yourself 2 to 3 weeks between each change before trying to cut out another medication.  Follow up in 6 months.

## 2021-11-28 NOTE — Progress Notes (Signed)
[facility]  Chief Complaint  Patient presents with   Follow-up    Patient has started with Berna Bue. Has no concerns at this time.     Past Surgical History:  She  has a past surgical history that includes Septoplasty; Tubal ligation; Abdominal hysterectomy; Replacement total knee bilateral; Nissen fundoplication; Endovenous ablation saphenous vein w/ laser (12-12-2011); Colonoscopy (03/12/2012); Tonsillectomy; Cataract extraction w/PHACO (07/13/2012); Cataract extraction; Cataract extraction w/PHACO (Left, 01/11/2013); Cesarean section; bilateral oopherectomy; Esophagogastroduodenoscopy (N/A, 06/01/2015); and Back surgery.   Past Medical History:  Cataract, Cystocele, Rectocele, Depression, GERD s/p Nissen fundoplication, HSV, Migraine headache, RLS, Varicose veins  Constitutional:  BP 130/90 (BP Location: Left Arm, Patient Position: Sitting, Cuff Size: Normal)    Pulse 78    Temp 98.2 F (36.8 C) (Oral)    Ht 5\' 4"  (1.626 m)    Wt 187 lb (84.8 kg)    SpO2 98%    BMI 32.10 kg/m   Brief Summary:  [name] is a 56 y.o. female with minimal smoking history has asthma with eosinophilic phenotype and chronic rhinitis.  She works as a Marine scientist.      Subjective:   She started fasenra on 07/26/21.    She had episode of wheezing in October and was treated with a course of prednisone.  She has been doing better since.  Hasn't need prednisone as frequently as before, but she still likes to keep some available in case she has a flare up.  She hasn't needed albuterol much.  Still gets sneezing and runny nose, but symptoms not settling into her chest.    She has trouble with weather changes and this makes it hard for her to exercise outside.    Physical Exam:   Appearance - well kempt   ENMT - no sinus tenderness, no oral exudate, no LAN, Mallampati 3 airway, no stridor  Respiratory - equal breath sounds bilaterally, no wheezing or rales  CV -  s1s2 regular rate and rhythm, no murmurs  Ext - no clubbing, no edema  Skin - no rashes  Psych - normal mood and affect     Pulmonary testing:  PFT 04/07/12 >> FEV1 2.65 (88%), FEV1% 78, TLC 5.03 (95%), DLCO 80%, +BD from FEF 25-75 PFT 05/29/21 >> FEV1 2.11 (80%), FEV1% 78, TLC 7.04 (139%), DLCO 77%  Chest Imaging:  CT chest 07/22/13 >> scarring in RML/Lingula/LLL, mild areas of BTX  Sleep Tests:  PSG 09/25/12 >> AHI 3.1, SpO2 low 84%   Social History:  She  reports that she quit smoking about 40 years ago. Her smoking use included cigarettes. She has a 0.50 pack-year smoking history. She has never used smokeless tobacco. She reports current alcohol use. She reports that she does not use drugs.  Family History:  Her family history includes Asthma in her child and maternal grandmother; Cancer in her paternal grandmother; Depression in her mother and sister; Diabetes in her mother; Diabetes type II in her mother; Fibromyalgia in her sister; Heart failure in her father; Hypertension in her brother, father, and sister; Migraines in her sister; Other in her sister.     Assessment/Plan:   Severe persistent asthma with eosinophilic phenotype. - started benralizumab (fasenra) 07/26/21 - continue high dose breo and singulair - prn albuterol - reviewed process of stepping down her medications as tolerated - she keeps a stash of prednisone on hand, and knows to contact the office if she feels the need to start prednisone again  Allergic rhinitis. - reactions to dust, mildew, cats - previously tried allergy injections, but ineffective in controlling symptoms - discussed process of stepping down azelastine, flonase, zyrtec, and singulair as tolerated - discussed importance of limiting use of nasal decongestants  History of mild regional bronchiectasis. - likely post infectious  - bronchial hygiene  Time Spent Involved in Patient Care on Day of Examination:  32 minutes  Follow up:    Patient Instructions  You can try stepping down your asthma and allergy medications in the following sequence: azelastine, flonase, zyrtec, singulair.  Give yourself 2 to 3 weeks between each change before trying to cut out another medication.  Follow up in 6 months.  Medication List:   Allergies as of 11/28/2021       Reactions   Hydrocodone Itching   Oxycodone-Itching,    Aminophylline Rash   Nystatin Rash   Penicillins Rash   Sulfamethoxazole-trimethoprim Rash   Pt has taken before but DS causes rash. Single strength does not        Medication List        Accurate as of November 28, 2021  9:23 AM. If you have any questions, ask your nurse or doctor.          albuterol 108 (90 Base) MCG/ACT inhaler Commonly known as: VENTOLIN HFA Inhale 2 puffs into the lungs every 6 (six) hours as needed. For shortness of breath What changed: Another medication with the same name was added. Make sure you understand how and when to take each. Changed by: Chesley Mires, MD   albuterol (2.5 MG/3ML) 0.083% nebulizer solution Commonly known as: PROVENTIL Take 3 mLs (2.5 mg total) by nebulization every 6 (six) hours as needed for wheezing or shortness of breath. What changed: You were already taking a medication with the same name, and this prescription was added. Make sure you understand how and when to take each. Changed by: Chesley Mires, MD   azelastine 0.1 % nasal spray Commonly known as: ASTELIN 1 spray as needed.   buPROPion 150 MG 24 hr tablet Commonly known as: WELLBUTRIN XL TAKE 3 TABLETS EVERY DAY What changed: See the new instructions.   cetirizine 10 MG tablet Commonly known as: ZYRTEC Take 10 mg by mouth daily.   dexlansoprazole 60 MG capsule Commonly known as: DEXILANT Take 1 capsule (60 mg total) by mouth daily.   Fasenra Pen 30 MG/ML Soaj Generic drug: Benralizumab Inject 30mg  into the skin at Week 4 (08/23/21) and Week 8 (09/20/21) then every 8 weeks  thereafter   Fasenra Pen 30 MG/ML Soaj Generic drug: Benralizumab Inject 1 mL (30 mg total) into the skin every 8 (eight) weeks.   fluconazole 150 MG tablet Commonly known as: DIFLUCAN TAKE ONE TABLET BY MOUTH AS A ONE-TIME DOSE MAY REPEAT IN 3 DAYS IF NEEDED   fluticasone 50 MCG/ACT nasal spray Commonly known as: FLONASE Place 1 spray into the nose daily.   fluticasone furoate-vilanterol 200-25 MCG/INH Aepb Commonly known as: Breo Ellipta Inhale 1 puff into the lungs daily.   furosemide 20 MG tablet Commonly known as: LASIX Take 20 mg by mouth. Takes 1/2 tablet PRN   methocarbamol 500 MG tablet Commonly known as: ROBAXIN   montelukast 10 MG tablet Commonly known as: SINGULAIR Take 10 mg by mouth at bedtime.   predniSONE 10 MG tablet Commonly known as: DELTASONE Take 3 tablets (30 mg total) by mouth daily with breakfast for 2 days, THEN 2 tablets (20 mg total) daily with breakfast  for 2 days, THEN 1 tablet (10 mg total) daily with breakfast for 2 days. Start taking on: November 28, 2021 Started by: Chesley Mires, MD   rizatriptan 10 MG disintegrating tablet Commonly known as: MAXALT-MLT   valACYclovir 1000 MG tablet Commonly known as: VALTREX TAKE 1 TABLET TWICE DAILY   Xiidra 5 % Soln Generic drug: Lifitegrast        Signature:  Chesley Mires, MD [place] Pager - [phone] 11/28/2021, 9:23 AM

## 2022-01-15 ENCOUNTER — Other Ambulatory Visit: Payer: Self-pay

## 2022-01-15 ENCOUNTER — Other Ambulatory Visit (HOSPITAL_COMMUNITY): Payer: Self-pay | Admitting: Internal Medicine

## 2022-01-15 ENCOUNTER — Ambulatory Visit (HOSPITAL_COMMUNITY)
Admission: RE | Admit: 2022-01-15 | Discharge: 2022-01-15 | Disposition: A | Discharge status: Home / Self Care | Payer: 59 | Source: Ambulatory Visit | Attending: Internal Medicine | Admitting: Internal Medicine

## 2022-01-15 DIAGNOSIS — R0781 Pleurodynia: Secondary | ICD-10-CM

## 2022-02-08 ENCOUNTER — Encounter (INDEPENDENT_AMBULATORY_CARE_PROVIDER_SITE_OTHER): Payer: Self-pay | Admitting: *Deleted

## 2022-02-16 ENCOUNTER — Other Ambulatory Visit: Payer: Self-pay | Admitting: Adult Health

## 2022-02-27 ENCOUNTER — Encounter (INDEPENDENT_AMBULATORY_CARE_PROVIDER_SITE_OTHER): Payer: Self-pay | Admitting: *Deleted

## 2022-02-27 ENCOUNTER — Telehealth (INDEPENDENT_AMBULATORY_CARE_PROVIDER_SITE_OTHER): Payer: Self-pay | Admitting: *Deleted

## 2022-02-27 MED ORDER — PEG 3350-KCL-NA BICARB-NACL 420 G PO SOLR: 4000.0000 mL | Freq: Once | ORAL | 0 refills | Status: AC

## 2022-02-27 NOTE — Telephone Encounter (Signed)
Patient needs trilyte 

## 2022-03-07 ENCOUNTER — Other Ambulatory Visit (INDEPENDENT_AMBULATORY_CARE_PROVIDER_SITE_OTHER): Payer: Self-pay

## 2022-03-07 DIAGNOSIS — Z1211 Encounter for screening for malignant neoplasm of colon: Secondary | ICD-10-CM

## 2022-03-11 ENCOUNTER — Other Ambulatory Visit (HOSPITAL_COMMUNITY): Payer: Self-pay | Admitting: Internal Medicine

## 2022-03-11 DIAGNOSIS — Z1231 Encounter for screening mammogram for malignant neoplasm of breast: Secondary | ICD-10-CM

## 2022-03-18 ENCOUNTER — Ambulatory Visit (HOSPITAL_COMMUNITY)
Admission: RE | Admit: 2022-03-18 | Discharge: 2022-03-18 | Disposition: A | Discharge status: Home / Self Care | Payer: 59 | Source: Ambulatory Visit | Attending: Internal Medicine | Admitting: Internal Medicine

## 2022-03-18 DIAGNOSIS — Z1231 Encounter for screening mammogram for malignant neoplasm of breast: Secondary | ICD-10-CM | POA: Diagnosis present

## 2022-03-20 ENCOUNTER — Telehealth (INDEPENDENT_AMBULATORY_CARE_PROVIDER_SITE_OTHER): Payer: Self-pay | Admitting: *Deleted

## 2022-03-20 ENCOUNTER — Other Ambulatory Visit (HOSPITAL_COMMUNITY): Payer: Self-pay | Admitting: Internal Medicine

## 2022-03-20 DIAGNOSIS — R928 Other abnormal and inconclusive findings on diagnostic imaging of breast: Secondary | ICD-10-CM

## 2022-03-20 NOTE — Telephone Encounter (Signed)
Referring MD/PCP: fagan ? ?Procedure: tcs ? ?Reason/Indication:  screening ? ?Has patient had this procedure before?  Yes, 02/2012 ? If so, when, by whom and where?   ? ?Is there a family history of colon cancer?  no ? Who?  What age when diagnosed?   ? ?Is patient diabetic? If yes, Type 1 or Type 2   no ?     ?Does patient have prosthetic heart valve or mechanical valve?  no ? ?Do you have a pacemaker/defibrillator?  no ? ?Has patient ever had endocarditis/atrial fibrillation? no ? ?Does patient use oxygen? no ? ?Has patient had joint replacement within last 12 months?  no ? ?Is patient constipated or do they take laxatives? yes ? ?Does patient have a history of alcohol/drug use?  no ? ?Have you had a stroke/heart attack last 6 mths? no ? ?Do you take medicine for weight loss?  no ? ?For female patients,: have you had a hysterectomy yes ?                     are you post menopausal  ?                     do you still have your menstrual cycle no ? ?Is patient on blood thinner such as Coumadin, Plavix and/or Aspirin? no ? ?Medications: welbutrin 150 mg bid, preo daily, flonas daily, losartan 50 mg daily, montelukast 10 mg daily,  ? ?Allergies: pcn, double strength bactrim ? ?Medication Adjustment per Dr Rehman/Dr Jenetta Downer  ? ?Procedure date & time: 04/18/22 ? ? ?

## 2022-03-26 ENCOUNTER — Ambulatory Visit (HOSPITAL_COMMUNITY)
Admission: RE | Admit: 2022-03-26 | Discharge: 2022-03-26 | Disposition: A | Discharge status: Home / Self Care | Payer: 59 | Source: Ambulatory Visit | Attending: Internal Medicine | Admitting: Internal Medicine

## 2022-03-26 DIAGNOSIS — R928 Other abnormal and inconclusive findings on diagnostic imaging of breast: Secondary | ICD-10-CM | POA: Diagnosis present

## 2022-04-08 ENCOUNTER — Other Ambulatory Visit (INDEPENDENT_AMBULATORY_CARE_PROVIDER_SITE_OTHER): Payer: Self-pay

## 2022-04-08 DIAGNOSIS — Z01812 Encounter for preprocedural laboratory examination: Secondary | ICD-10-CM

## 2022-04-08 DIAGNOSIS — R188 Other ascites: Secondary | ICD-10-CM

## 2022-04-18 ENCOUNTER — Ambulatory Visit (HOSPITAL_COMMUNITY)
Admission: RE | Admit: 2022-04-18 | Discharge: 2022-04-18 | Disposition: A | Discharge status: Home / Self Care | Payer: 59 | Source: Ambulatory Visit | Attending: Internal Medicine | Admitting: Internal Medicine

## 2022-04-18 ENCOUNTER — Encounter (HOSPITAL_COMMUNITY)
Admission: RE | Disposition: A | Discharge status: Home / Self Care | Payer: Self-pay | Source: Ambulatory Visit | Attending: Internal Medicine

## 2022-04-18 ENCOUNTER — Ambulatory Visit (HOSPITAL_BASED_OUTPATIENT_CLINIC_OR_DEPARTMENT_OTHER): Payer: 59 | Admitting: Anesthesiology

## 2022-04-18 ENCOUNTER — Encounter (INDEPENDENT_AMBULATORY_CARE_PROVIDER_SITE_OTHER): Payer: Self-pay | Admitting: *Deleted

## 2022-04-18 ENCOUNTER — Encounter (HOSPITAL_COMMUNITY): Payer: Self-pay | Admitting: Internal Medicine

## 2022-04-18 ENCOUNTER — Ambulatory Visit (HOSPITAL_COMMUNITY): Payer: 59 | Admitting: Anesthesiology

## 2022-04-18 ENCOUNTER — Other Ambulatory Visit: Payer: Self-pay

## 2022-04-18 DIAGNOSIS — Z1211 Encounter for screening for malignant neoplasm of colon: Secondary | ICD-10-CM | POA: Insufficient documentation

## 2022-04-18 DIAGNOSIS — K644 Residual hemorrhoidal skin tags: Secondary | ICD-10-CM | POA: Insufficient documentation

## 2022-04-18 DIAGNOSIS — R519 Headache, unspecified: Secondary | ICD-10-CM | POA: Diagnosis not present

## 2022-04-18 DIAGNOSIS — M1990 Unspecified osteoarthritis, unspecified site: Secondary | ICD-10-CM | POA: Insufficient documentation

## 2022-04-18 DIAGNOSIS — K6389 Other specified diseases of intestine: Secondary | ICD-10-CM | POA: Diagnosis not present

## 2022-04-18 DIAGNOSIS — Z87891 Personal history of nicotine dependence: Secondary | ICD-10-CM | POA: Diagnosis not present

## 2022-04-18 DIAGNOSIS — R0602 Shortness of breath: Secondary | ICD-10-CM | POA: Insufficient documentation

## 2022-04-18 DIAGNOSIS — Z86010 Personal history of colonic polyps: Secondary | ICD-10-CM

## 2022-04-18 DIAGNOSIS — F32A Depression, unspecified: Secondary | ICD-10-CM | POA: Insufficient documentation

## 2022-04-18 DIAGNOSIS — J45909 Unspecified asthma, uncomplicated: Secondary | ICD-10-CM | POA: Insufficient documentation

## 2022-04-18 DIAGNOSIS — K219 Gastro-esophageal reflux disease without esophagitis: Secondary | ICD-10-CM | POA: Diagnosis not present

## 2022-04-18 HISTORY — PX: COLONOSCOPY WITH PROPOFOL: SHX5780

## 2022-04-18 LAB — HM COLONOSCOPY

## 2022-04-18 SURGERY — COLONOSCOPY WITH PROPOFOL
Anesthesia: General

## 2022-04-18 MED ORDER — PROPOFOL 10 MG/ML IV BOLUS
INTRAVENOUS | Status: DC | PRN
  Administered 2022-04-18 (×3): 20 mg via INTRAVENOUS
  Administered 2022-04-18: 100 mg via INTRAVENOUS

## 2022-04-18 MED ORDER — LIDOCAINE HCL (CARDIAC) PF 50 MG/5ML IV SOSY
PREFILLED_SYRINGE | INTRAVENOUS | Status: DC | PRN
Start: 2022-04-18 — End: 2022-04-18
  Administered 2022-04-18: 100 mg via INTRAVENOUS

## 2022-04-18 MED ORDER — PROPOFOL 500 MG/50ML IV EMUL
INTRAVENOUS | Status: DC | PRN
Start: 2022-04-18 — End: 2022-04-18
  Administered 2022-04-18: 75 ug/kg/min via INTRAVENOUS

## 2022-04-18 MED ORDER — LACTATED RINGERS IV SOLN: INTRAVENOUS | Status: DC

## 2022-04-18 NOTE — Op Note (Signed)
[facility] Patient Name: [name] Procedure Date: 04/18/2022 7:15 AM MRN: [id number] Date of Birth: 25-May-1966 Attending MD: Hildred Laser , MD CSN: [id number] Age: 56 Admit Type: Outpatient Procedure:                Colonoscopy Indications:              High risk colon cancer surveillance: Personal                            history of colonic polyps Providers:                Hildred Laser, MD, Caprice Kluver, Rosina Lowenstein, RN Referring MD:             Asencion Noble, MD Medicines:                Propofol per Anesthesia Complications:            No immediate complications. Estimated Blood Loss:     Estimated blood loss: none. Procedure:                Pre-Anesthesia Assessment:                           - Prior to the procedure, a History and Physical                            was performed, and patient medications and                            allergies were reviewed. The patient's tolerance of                            previous anesthesia was also reviewed. The risks                            and benefits of the procedure and the sedation                            options and risks were discussed with the patient.                            All questions were answered, and informed consent                            was obtained. Prior Anticoagulants: The patient has                            taken no previous anticoagulant or antiplatelet                            agents. ASA Grade Assessment: II - A patient with                            mild systemic disease. After reviewing the risks  and benefits, the patient was deemed in                            satisfactory condition to undergo the procedure.                           After obtaining informed consent, the colonoscope                            was passed under direct vision. Throughout the                            procedure, the patient's blood pressure, pulse, and                             oxygen saturations were monitored continuously. The                            [id number]-HQ190L ([id number]) scope was introduced through                            the anus and advanced to the the cecum, identified                            by appendiceal orifice and ileocecal valve. The                            colonoscopy was performed without difficulty. The                            patient tolerated the procedure well. The quality                            of the bowel preparation was good. The ileocecal                            valve, appendiceal orifice, and rectum were                            photographed. Scope In: 7:35:35 AM Scope Out: 7:50:59 AM Scope Withdrawal Time: 0 hours 9 minutes 20 seconds  Total Procedure Duration: 0 hours 15 minutes 24 seconds  Findings:      The perianal and digital rectal examinations were normal.      A diffuse area of severe melanosis was found in the entire colon.      The exam was otherwise normal throughout the examined colon.      External hemorrhoids were found during retroflexion. The hemorrhoids       were small. Impression:               - Melanosis in the colon.                           - External hemorrhoids.                           -  No specimens collected. Moderate Sedation:      Per Anesthesia Care Recommendation:           - Patient has a contact number available for                            emergencies. The signs and symptoms of potential                            delayed complications were discussed with the                            patient. Return to normal activities tomorrow.                            Written discharge instructions were provided to the                            patient.                           - High fiber diet today.                           - Continue present medications.                           - Repeat colonoscopy in 10 years. Procedure Code(s):        --- Professional ---                            [phone], Colonoscopy, flexible; diagnostic, including                            collection of specimen(s) by brushing or washing,                            when performed (separate procedure) Diagnosis Code(s):        --- Professional ---                           Z86.010, Personal history of colonic polyps                           K63.89, Other specified diseases of intestine                           K64.4, Residual hemorrhoidal skin tags CPT copyright 2019 [employer] Association. All rights reserved. The codes documented in this report are preliminary and upon coder review may  be revised to meet current compliance requirements. Hildred Laser, MD Hildred Laser, MD 04/18/2022 7:58:30 AM This report has been signed electronically. Number of Addenda: 0

## 2022-04-18 NOTE — Anesthesia Procedure Notes (Signed)
Date/Time: 04/18/2022 7:29 AM Performed by: Vista Deck, CRNA Pre-anesthesia Checklist: Patient identified, Emergency Drugs available, Suction available, Timeout performed and Patient being monitored Patient Re-evaluated:Patient Re-evaluated prior to induction Oxygen Delivery Method: Nasal Cannula

## 2022-04-18 NOTE — Transfer of Care (Signed)
Immediate Anesthesia Transfer of Care Note  Patient: [name]  Procedure(s) Performed: COLONOSCOPY WITH PROPOFOL  Patient Location: Endoscopy Unit  Anesthesia Type:General  Level of Consciousness: awake and patient cooperative  Airway & Oxygen Therapy: Patient Spontanous Breathing  Post-op Assessment: Report given to RN and Post -op Vital signs reviewed and stable  Post vital signs: Reviewed and stable  Last Vitals:  Vitals Value Taken Time  BP 94/64 0757  Temp 97.6 755  Pulse 81 0757  Resp 20 0757  SpO2 98 0757    Last Pain:  Vitals:   04/18/22 0730  TempSrc:   PainSc: 0-No pain      Patients Stated Pain Goal: 7 (77/82/42 [id number])  Complications: No notable events documented.

## 2022-04-18 NOTE — Discharge Instructions (Signed)
Resume usual medications as before. High-fiber diet. No driving for 24 hours. Next colonoscopy in 10 years.

## 2022-04-18 NOTE — Anesthesia Postprocedure Evaluation (Signed)
Anesthesia Post Note  Patient: [name]  Procedure(s) Performed: COLONOSCOPY WITH PROPOFOL  Patient location during evaluation: Phase II Anesthesia Type: General Level of consciousness: awake and alert and oriented Pain management: pain level controlled Vital Signs Assessment: post-procedure vital signs reviewed and stable Respiratory status: spontaneous breathing, nonlabored ventilation and respiratory function stable Cardiovascular status: blood pressure returned to baseline and stable Postop Assessment: no apparent nausea or vomiting Anesthetic complications: no   No notable events documented.   Last Vitals:  Vitals:   04/18/22 0659 04/18/22 0757  BP: 115/76 94/64  Pulse: 88 81  Resp: 18 20  Temp: 36.5 C 36.4 C  SpO2: 99% 98%    Last Pain:  Vitals:   04/18/22 0757  TempSrc: Oral  PainSc: 0-No pain                 [name]

## 2022-04-18 NOTE — Anesthesia Preprocedure Evaluation (Addendum)
Anesthesia Evaluation  Patient identified by MRN, date of birth, ID band Patient awake    Reviewed: Allergy & Precautions, NPO status , Patient's Chart, lab work & pertinent test results  Airway Mallampati: II  TM Distance: >3 FB Neck ROM: Full    Dental  (+) Dental Advisory Given   Pulmonary shortness of breath and with exertion, asthma , former smoker,    Pulmonary exam normal breath sounds clear to auscultation       Cardiovascular negative cardio ROS Normal cardiovascular exam Rhythm:Regular Rate:Normal     Neuro/Psych  Headaches, PSYCHIATRIC DISORDERS Depression  Neuromuscular disease    GI/Hepatic Neg liver ROS, GERD  Medicated and Poorly Controlled,  Endo/Other  negative endocrine ROS  Renal/GU negative Renal ROS  negative genitourinary   Musculoskeletal  (+) Arthritis , Osteoarthritis,    Abdominal   Peds negative pediatric ROS (+)  Hematology negative hematology ROS (+)   Anesthesia Other Findings   Reproductive/Obstetrics negative OB ROS                            Anesthesia Physical Anesthesia Plan  ASA: 2  Anesthesia Plan: General   Post-op Pain Management: Minimal or no pain anticipated   Induction: Intravenous  PONV Risk Score and Plan: Propofol infusion  Airway Management Planned: Nasal Cannula and Natural Airway  Additional Equipment:   Intra-op Plan:   Post-operative Plan:   Informed Consent: I have reviewed the patients History and Physical, chart, labs and discussed the procedure including the risks, benefits and alternatives for the proposed anesthesia with the patient or authorized representative who has indicated his/her understanding and acceptance.     Dental advisory given  Plan Discussed with: CRNA and Surgeon  Anesthesia Plan Comments:        Anesthesia Quick Evaluation

## 2022-04-18 NOTE — H&P (Signed)
[name] is an 56 y.o. female.   Chief Complaint: Patient is here for colonoscopy HPI: Patient is 56 year old Caucasian female who is here for surveillance colonoscopy.  Last exam was in April 2013 with removal of single tubular adenoma.  Recommendation was 7 years.  She has no complaints.  She denies recent change in bowel habits abdominal pain or rectal bleeding.  She feels prep has been poor hemorrhoids.  She has good appetite her weight has been stable.  She does not take aspirin or anticoagulants. Family history is negative for colon cancer.  Past Medical History:  Diagnosis Date   Allergy    Asthma    dxed at age 64   Cataracts, bilateral    Constipation 10/29/2013   Cystocele 10/29/2013   Depression    GERD (gastroesophageal reflux disease)    Hemorrhoid 01/12/2015   Herpes simplex virus (HSV) infection    Migraine    last migraine:01/06/13   Migraines 03/30/2013   Rectocele 10/29/2013   Restless leg syndrome    Shortness of breath    SOB with exertion   SUI (stress urinary incontinence, female)    Vaginal dryness 04/05/2016   Vaginal pain 04/05/2016   Varicose veins     Past Surgical History:  Procedure Laterality Date   ABDOMINAL HYSTERECTOMY     BACK SURGERY     C5-6 decompression and fusion   bilateral oopherectomy     with posterior repair; perineoplasty   CATARACT EXTRACTION     CATARACT EXTRACTION W/PHACO  07/13/2012   Procedure: CATARACT EXTRACTION PHACO AND INTRAOCULAR LENS PLACEMENT ([place]);  Surgeon: Williams Che, MD;  Location: [facility];  Service: Ophthalmology;  Laterality: Right;  CDE:1.33   CATARACT EXTRACTION W/PHACO Left 01/11/2013   Procedure: CATARACT EXTRACTION PHACO AND INTRAOCULAR LENS PLACEMENT ([facility]);  Surgeon: Williams Che, MD;  Location: [facility];  Service: Ophthalmology;  Laterality: Left;  CDE 0.00   CESAREAN SECTION     COLONOSCOPY  03/12/2012   Procedure: COLONOSCOPY;  Surgeon: Rogene Houston, MD;  Location: [facility];  Service:  Endoscopy;  Laterality: N/A;  1200   ENDOVENOUS ABLATION SAPHENOUS VEIN W/ LASER  12-12-2011   left greater saphenous vein      ESOPHAGOGASTRODUODENOSCOPY N/A 06/01/2015   Procedure: ESOPHAGOGASTRODUODENOSCOPY (EGD);  Surgeon: Rogene Houston, MD;  Location: [facility];  Service: Endoscopy;  Laterality: N/A;  696   NISSEN FUNDOPLICATION     REPLACEMENT TOTAL KNEE BILATERAL     SEPTOPLASTY     TONSILLECTOMY     TUBAL LIGATION      Family History  Problem Relation Age of Onset   Diabetes Mother    Depression Mother    Diabetes type II Mother    Migraines Sister    Other Sister        migranes   Depression Sister    Fibromyalgia Sister    Hypertension Sister    Hypertension Father    Heart failure Father    Asthma Maternal Grandmother    Hypertension Brother    Cancer Paternal Grandmother    Asthma Child    Social History:  reports that she quit smoking about 40 years ago. Her smoking use included cigarettes. She has a 0.50 pack-year smoking history. She has never used smokeless tobacco. She reports current alcohol use. She reports that she does not use drugs.  Allergies:  Allergies  Allergen Reactions   Hydrocodone Itching    Oxycodone-Itching,    Aminophylline  Rash   Bactrim [Sulfamethoxazole-Trimethoprim] Rash    Pt has taken before but DS causes rash. Single strength does not   Nystatin Rash   Penicillins Rash    Medications Prior to Admission  Medication Sig Dispense Refill   albuterol (PROVENTIL) (2.5 MG/3ML) 0.083% nebulizer solution Take 3 mLs (2.5 mg total) by nebulization every 6 (six) hours as needed for wheezing or shortness of breath. 75 mL 5   albuterol (VENTOLIN HFA) 108 (90 Base) MCG/ACT inhaler Inhale 2 puffs into the lungs every 6 (six) hours as needed. For shortness of breath 8 g 3   Benralizumab (FASENRA PEN) 30 MG/ML SOAJ Inject 1 mL (30 mg total) into the skin every 8 (eight) weeks. 1 mL 1   buPROPion (WELLBUTRIN SR) 150 MG 12 hr tablet Take 150 mg  by mouth 2 (two) times daily.     Calcium Carb-Cholecalciferol (CALCIUM 600 + D PO) Take 1 tablet by mouth daily.     cetirizine (ZYRTEC) 10 MG tablet Take 10 mg by mouth daily.     dexlansoprazole (DEXILANT) 60 MG capsule Take 1 capsule (60 mg total) by mouth daily. 90 capsule 0   fluticasone (FLONASE) 50 MCG/ACT nasal spray Place 1 spray into the nose daily.      fluticasone furoate-vilanterol (BREO ELLIPTA) 200-25 MCG/INH AEPB Inhale 1 puff into the lungs daily. 180 each 3   losartan (COZAAR) 50 MG tablet Take 50 mg by mouth daily.     methocarbamol (ROBAXIN) 500 MG tablet Take 500 mg by mouth every 8 (eight) hours as needed for muscle spasms.     montelukast (SINGULAIR) 10 MG tablet Take 10 mg by mouth at bedtime.     rizatriptan (MAXALT-MLT) 10 MG disintegrating tablet Take 10 mg by mouth as needed for migraine.     valACYclovir (VALTREX) 500 MG tablet Take 500 mg by mouth daily as needed (outbreaks).     XIIDRA 5 % SOLN Place 1 drop into both eyes in the morning and at bedtime.      No results found for this or any previous visit (from the past 48 hour(s)). No results found.  Review of Systems  Blood pressure 115/76, pulse 88, temperature 97.7 F (36.5 C), temperature source Oral, resp. rate 18, height '5\' 3"'$  (1.6 m), weight 74.4 kg, SpO2 99 %. Physical Exam HENT:     Mouth/Throat:     Mouth: Mucous membranes are moist.     Pharynx: Oropharynx is clear.  Eyes:     General: No scleral icterus.    Conjunctiva/sclera: Conjunctivae normal.  Cardiovascular:     Rate and Rhythm: Normal rate and regular rhythm.     Heart sounds: Normal heart sounds. No murmur heard. Pulmonary:     Effort: Pulmonary effort is normal.     Breath sounds: Normal breath sounds.  Abdominal:     General: There is no distension.     Palpations: Abdomen is soft. There is no mass.     Tenderness: There is no abdominal tenderness.  Musculoskeletal:        General: No swelling.     Cervical back: Neck  supple.  Lymphadenopathy:     Cervical: No cervical adenopathy.  Skin:    General: Skin is warm and dry.  Neurological:     Mental Status: She is alert.     Assessment/Plan  History of colonic tubular adenoma Surveillance colonoscopy.  Hildred Laser, MD 04/18/2022, 7:23 AM

## 2022-04-25 ENCOUNTER — Encounter (HOSPITAL_COMMUNITY): Payer: Self-pay | Admitting: Internal Medicine

## 2022-06-06 ENCOUNTER — Ambulatory Visit: Payer: BC Managed Care – PPO | Admitting: Pulmonary Disease

## 2022-06-06 ENCOUNTER — Encounter: Payer: Self-pay | Admitting: Pulmonary Disease

## 2022-06-06 VITALS — BP 128/82 | HR 72 | Temp 98.0°F | Ht 63.0 in | Wt 165.8 lb

## 2022-06-06 DIAGNOSIS — J4550 Severe persistent asthma, uncomplicated: Secondary | ICD-10-CM

## 2022-06-06 MED ORDER — PREDNISONE 10 MG PO TABS: ORAL_TABLET | ORAL | 0 refills | Status: AC

## 2022-06-06 NOTE — Patient Instructions (Signed)
Follow up in 6 months 

## 2022-06-06 NOTE — Progress Notes (Signed)
[facility]  Chief Complaint  Patient presents with   Follow-up    Asthma has improved since last ov.     Past Surgical History:  She  has a past surgical history that includes Septoplasty; Tubal ligation; Abdominal hysterectomy; Replacement total knee bilateral; Nissen fundoplication; Endovenous ablation saphenous vein w/ laser (12-12-2011); Colonoscopy (03/12/2012); Tonsillectomy; Cataract extraction w/PHACO (07/13/2012); Cataract extraction; Cataract extraction w/PHACO (Left, 01/11/2013); Cesarean section; bilateral oopherectomy; Esophagogastroduodenoscopy (N/A, 06/01/2015); Back surgery; and Colonoscopy with propofol (N/A, 04/18/2022).   Past Medical History:  Cataract, Cystocele, Rectocele, Depression, GERD s/p Nissen fundoplication, HSV, Migraine headache, RLS, Varicose veins  Constitutional:  BP 128/82 (BP Location: Left Arm, Patient Position: Sitting)   Pulse 72   Temp 98 F (36.7 C) (Temporal)   Ht '5\' 3"'$  (1.6 m)   Wt 165 lb 12.8 oz (75.2 kg)   SpO2 98% Comment: ra  BMI 29.37 kg/m   Brief Summary:  [name] is a 56 y.o. female with minimal smoking history has asthma with eosinophilic phenotype and chronic rhinitis.  She works as a Marine scientist.      Subjective:   She broke her 6th and 7th Lt ribs in February.  She had an episode of bronchitis then.  She had to use her stash of prednisone.  It took about 2 months for her chest discomfort to resolve.  More recently she has more sneezing.  She started using azelastine again.  Not having cough, wheeze, or sputum.  Sleeping okay.  No skin rashes.  Breathing doesn't limit her activity.    She was been working with [employer].  She has lost 22 lbs since her last visit in January.  Physical Exam:   Appearance - well kempt   ENMT - no sinus tenderness, no oral exudate, no LAN, Mallampati 3 airway, no stridor  Respiratory - equal breath sounds bilaterally, no wheezing or  rales  CV - s1s2 regular rate and rhythm, no murmurs  Ext - no clubbing, no edema  Skin - no rashes  Psych - normal mood and affect     Pulmonary testing:  PFT 04/07/12 >> FEV1 2.65 (88%), FEV1% 78, TLC 5.03 (95%), DLCO 80%, +BD from FEF 25-75 PFT 05/29/21 >> FEV1 2.11 (80%), FEV1% 78, TLC 7.04 (139%), DLCO 77%  Chest Imaging:  CT chest 07/22/13 >> scarring in RML/Lingula/LLL, mild areas of BTX  Sleep Tests:  PSG 09/25/12 >> AHI 3.1, SpO2 low 84%   Social History:  She  reports that she quit smoking about 40 years ago. Her smoking use included cigarettes. She has a 0.50 pack-year smoking history. She has never used smokeless tobacco. She reports current alcohol use. She reports that she does not use drugs.  Family History:  Her family history includes Asthma in her child and maternal grandmother; Cancer in her paternal grandmother; Depression in her mother and sister; Diabetes in her mother; Diabetes type II in her mother; Fibromyalgia in her sister; Heart failure in her father; Hypertension in her brother, father, and sister; Migraines in her sister; Other in her sister.     Assessment/Plan:   Severe persistent asthma with eosinophilic phenotype. - started benralizumab (fasenra) 07/26/21; this has helped control her symptoms and reduce frequency of exacerbations; she is to continue benralizumab - continue breo 200 one puff daily, and singulair 10 mg daily - prn albuterol - she keeps a stash of prednisone on hand, and knows to contact the office if she feels  the need to start prednisone again  Allergic rhinitis. - reactions to dust, mildew, cats - previously tried allergy injections, but ineffective in controlling symptoms - continue flonase, zyrtec, and singulair - prn azelastine  History of mild regional bronchiectasis. - likely post infectious  - bronchial hygiene  Time Spent Involved in Patient Care on Day of Examination:  27 minutes  Follow up:   Patient  Instructions  Follow up in 6 months  Medication List:   Allergies as of 06/06/2022       Reactions   Hydrocodone Itching   Oxycodone-Itching,    Aminophylline Rash   Bactrim [sulfamethoxazole-trimethoprim] Rash   Pt has taken before but DS causes rash. Single strength does not   Nystatin Rash   Penicillins Rash        Medication List        Accurate as of June 06, 2022  8:52 AM. If you have any questions, ask your nurse or doctor.          albuterol 108 (90 Base) MCG/ACT inhaler Commonly known as: VENTOLIN HFA Inhale 2 puffs into the lungs every 6 (six) hours as needed. For shortness of breath   albuterol (2.5 MG/3ML) 0.083% nebulizer solution Commonly known as: PROVENTIL Take 3 mLs (2.5 mg total) by nebulization every 6 (six) hours as needed for wheezing or shortness of breath.   buPROPion 150 MG 12 hr tablet Commonly known as: WELLBUTRIN SR Take 150 mg by mouth 2 (two) times daily.   CALCIUM 600 + D PO Take 1 tablet by mouth daily.   cetirizine 10 MG tablet Commonly known as: ZYRTEC Take 10 mg by mouth daily.   dexlansoprazole 60 MG capsule Commonly known as: DEXILANT Take 1 capsule (60 mg total) by mouth daily.   Fasenra Pen 30 MG/ML Soaj Generic drug: Benralizumab Inject 1 mL (30 mg total) into the skin every 8 (eight) weeks.   fluticasone 50 MCG/ACT nasal spray Commonly known as: FLONASE Place 1 spray into the nose daily.   fluticasone furoate-vilanterol 200-25 MCG/INH Aepb Commonly known as: Breo Ellipta Inhale 1 puff into the lungs daily.   losartan 50 MG tablet Commonly known as: COZAAR Take 50 mg by mouth daily.   methocarbamol 500 MG tablet Commonly known as: ROBAXIN Take 500 mg by mouth every 8 (eight) hours as needed for muscle spasms.   montelukast 10 MG tablet Commonly known as: SINGULAIR Take 10 mg by mouth at bedtime.   predniSONE 10 MG tablet Commonly known as: DELTASONE Take 3 tablets (30 mg total) by mouth daily with  breakfast for 2 days, THEN 2 tablets (20 mg total) daily with breakfast for 2 days, THEN 1 tablet (10 mg total) daily with breakfast for 2 days. Start taking on: June 06, 2022 Started by: Chesley Mires, MD   rizatriptan 10 MG disintegrating tablet Commonly known as: MAXALT-MLT Take 10 mg by mouth as needed for migraine.   valACYclovir 500 MG tablet Commonly known as: VALTREX Take 500 mg by mouth daily as needed (outbreaks).   Xiidra 5 % Soln Generic drug: Lifitegrast Place 1 drop into both eyes in the morning and at bedtime.        Signature:  Chesley Mires, MD [place] Pager - [phone] 06/06/2022, 8:52 AM

## 2022-07-30 ENCOUNTER — Encounter: Payer: Self-pay | Admitting: Pulmonary Disease

## 2022-07-30 MED ORDER — PREDNISONE 10 MG PO TABS: ORAL_TABLET | ORAL | 0 refills | Status: AC

## 2022-07-30 NOTE — Telephone Encounter (Signed)
  I had to use the prednisone prescription I was given it was a 6 day taper 3-2-1.  I finished it Sat,   Sunday evening I started getting  short of breath again  wheezing and coughing.  I took a covid test it was negative.   I'm just wondering if I need a longer course of prednisone.     I have been taking treatments 2-3 times a day as well.   This is normally a rough time of year for me due to the hot humidity weather.  Let me know what  I need to do and if I need more steroids than can be called to [facility] in [place]."  Patient also denies coughing anything up and states she had a temp of 100.5 on Saturday.   Dr. Halford Chessman please advise

## 2022-07-30 NOTE — Telephone Encounter (Signed)
Can send script for prednisone 10 mg pill >> 2 pills daily for 3 days, 1 pill daily for 3 days, 1/2 pill daily for 4 days.

## 2022-07-31 ENCOUNTER — Telehealth: Payer: Self-pay | Admitting: Pulmonary Disease

## 2022-07-31 MED ORDER — PAXLOVID (300/100) 20 X 150 MG & 10 X 100MG PO TBPK: 1.0000 | ORAL_TABLET | Freq: Two times a day (BID) | ORAL | 0 refills | Status: AC

## 2022-07-31 NOTE — Addendum Note (Signed)
Addended by: Fritzi Mandes D on: 07/31/2022 09:12 AM   Modules accepted: Orders

## 2022-07-31 NOTE — Telephone Encounter (Signed)
Patient sent another message stating she took another covid test and is positive and wants to know if she should take anything else/ do anything different. She has picked up the prednisone and wants to know if she should take it as prescribed still.   Please advise.

## 2022-07-31 NOTE — Telephone Encounter (Signed)
Can send script for paxlovid bid for 5 days.  She should take course of prednisone also.  Please schedule video visit with me or one of the NPs later this week to assess status.

## 2022-07-31 NOTE — Telephone Encounter (Signed)
[name] with [facility] needed verbal on Paxlovid, ok to dispense as 30, taking 3 twice.  Written for 10.  Dr. Richrd Humbles for 30.  [name] aware.

## 2022-08-02 ENCOUNTER — Encounter: Payer: Self-pay | Admitting: Pulmonary Disease

## 2022-08-02 ENCOUNTER — Telehealth (INDEPENDENT_AMBULATORY_CARE_PROVIDER_SITE_OTHER): Payer: BC Managed Care – PPO | Admitting: Pulmonary Disease

## 2022-08-02 DIAGNOSIS — J4550 Severe persistent asthma, uncomplicated: Secondary | ICD-10-CM | POA: Diagnosis not present

## 2022-08-02 DIAGNOSIS — U071 COVID-19: Secondary | ICD-10-CM

## 2022-08-02 DIAGNOSIS — J208 Acute bronchitis due to other specified organisms: Secondary | ICD-10-CM

## 2022-08-02 DIAGNOSIS — J4551 Severe persistent asthma with (acute) exacerbation: Secondary | ICD-10-CM

## 2022-08-02 NOTE — Progress Notes (Signed)
[facility]  Chief Complaint  Patient presents with   Wheezing    Past Surgical History:  She  has a past surgical history that includes Septoplasty; Tubal ligation; Abdominal hysterectomy; Replacement total knee bilateral; Nissen fundoplication; Endovenous ablation saphenous vein w/ laser (12-12-2011); Colonoscopy (03/12/2012); Tonsillectomy; Cataract extraction w/PHACO (07/13/2012); Cataract extraction; Cataract extraction w/PHACO (Left, 01/11/2013); Cesarean section; bilateral oopherectomy; Esophagogastroduodenoscopy (N/A, 06/01/2015); Back surgery; and Colonoscopy with propofol (N/A, 04/18/2022).   Past Medical History:  Cataract, Cystocele, Rectocele, Depression, GERD s/p Nissen fundoplication, HSV, Migraine headache, RLS, Varicose veins  Constitutional:  There were no vitals taken for this visit.  Brief Summary:  [name] is a 56 y.o. female with minimal smoking history has asthma with eosinophilic phenotype and chronic rhinitis.  She works as a Marine scientist.      Subjective:   Virtual Visit via Video Note  I connected with [name] on 08/02/22 at  2:00 PM EDT by a video enabled telemedicine application and verified that I am speaking with the correct person using two identifiers.  Location: Patient: home Provider: medical office   I discussed the limitations of evaluation and management by telemedicine and the availability of in person appointments. The patient expressed understanding and agreed to proceed.  History of Present Illness:  Her grandson attends daycare.  He developed a fever and was found to have COVID.   She started getting a cough and wheeze.  She had a headache and sore throat.  She did a COVID test that was negative.  She called to office and got prednisone.  She did COVID test again and this time was positive.  Started on paxlovid.  Headache and sore throat better.  Not having fever.  Still has cough and  chest rattling, but not as bad.  Not wheezing, and breathing feels more comfortable.  She isn't using nebulizer as much as she was earlier in the week.   Physical Exam:   Alert, pleasant, no distress, speaking in full sentences     Pulmonary testing:  PFT 04/07/12 >> FEV1 2.65 (88%), FEV1% 78, TLC 5.03 (95%), DLCO 80%, +BD from FEF 25-75 PFT 05/29/21 >> FEV1 2.11 (80%), FEV1% 78, TLC 7.04 (139%), DLCO 77%  Chest Imaging:  CT chest 07/22/13 >> scarring in RML/Lingula/LLL, mild areas of BTX  Sleep Tests:  PSG 09/25/12 >> AHI 3.1, SpO2 low 84%   Social History:  She  reports that she quit smoking about 41 years ago. Her smoking use included cigarettes. She has a 0.50 pack-year smoking history. She has never used smokeless tobacco. She reports current alcohol use. She reports that she does not use drugs.  Family History:  Her family history includes Asthma in her child and maternal grandmother; Cancer in her paternal grandmother; Depression in her mother and sister; Diabetes in her mother; Diabetes type II in her mother; Fibromyalgia in her sister; Heart failure in her father; Hypertension in her brother, father, and sister; Migraines in her sister; Other in her sister.     Assessment/Plan:   Acute asthma exacerbation after COVID 19 infection. - clinically improving - don't think she needs imaging studies at this time - complete prednisone taper and paxlovid course  Severe persistent asthma with eosinophilic phenotype. - started benralizumab (fasenra) 07/26/21; this has helped control her symptoms and reduce frequency of exacerbations; she is to continue benralizumab - continue breo 200 one puff daily, and singulair 10 mg daily - prn albuterol - she  keeps a stash of prednisone on hand, and knows to contact the office if she feels the need to start prednisone again  Allergic rhinitis. - reactions to dust, mildew, cats - previously tried allergy injections, but ineffective in  controlling symptoms - continue flonase, zyrtec, and singulair - prn azelastine  History of mild regional bronchiectasis. - likely post infectious  - bronchial hygiene  Time Spent Involved in Patient Care on Day of Examination:   I discussed the assessment and treatment plan with the patient. The patient was provided an opportunity to ask questions and all were answered. The patient agreed with the plan and demonstrated an understanding of the instructions.   The patient was advised to call back or seek an in-person evaluation if the symptoms worsen or if the condition fails to improve as anticipated.  I provided 26 minutes of non-face-to-face time during this encounter.   Follow up:   Patient Instructions  Follow up in 4 to 5 months  Medication List:   Allergies as of 08/02/2022       Reactions   Hydrocodone Itching   Oxycodone-Itching,    Aminophylline Rash   Bactrim [sulfamethoxazole-trimethoprim] Rash   Pt has taken before but DS causes rash. Single strength does not   Nystatin Rash   Penicillins Rash        Medication List        Accurate as of August 02, 2022  2:15 PM. If you have any questions, ask your nurse or doctor.          albuterol 108 (90 Base) MCG/ACT inhaler Commonly known as: VENTOLIN HFA Inhale 2 puffs into the lungs every 6 (six) hours as needed. For shortness of breath   albuterol (2.5 MG/3ML) 0.083% nebulizer solution Commonly known as: PROVENTIL Take 3 mLs (2.5 mg total) by nebulization every 6 (six) hours as needed for wheezing or shortness of breath.   buPROPion 150 MG 12 hr tablet Commonly known as: WELLBUTRIN SR Take 150 mg by mouth 2 (two) times daily.   CALCIUM 600 + D PO Take 1 tablet by mouth daily.   cetirizine 10 MG tablet Commonly known as: ZYRTEC Take 10 mg by mouth daily.   dexlansoprazole 60 MG capsule Commonly known as: DEXILANT Take 1 capsule (60 mg total) by mouth daily.   Fasenra Pen 30 MG/ML Soaj Generic  drug: Benralizumab Inject 1 mL (30 mg total) into the skin every 8 (eight) weeks.   fluticasone 50 MCG/ACT nasal spray Commonly known as: FLONASE Place 1 spray into the nose daily.   fluticasone furoate-vilanterol 200-25 MCG/INH Aepb Commonly known as: Breo Ellipta Inhale 1 puff into the lungs daily.   losartan 50 MG tablet Commonly known as: COZAAR Take 50 mg by mouth daily.   methocarbamol 500 MG tablet Commonly known as: ROBAXIN Take 500 mg by mouth every 8 (eight) hours as needed for muscle spasms.   montelukast 10 MG tablet Commonly known as: SINGULAIR Take 10 mg by mouth at bedtime.   Paxlovid (300/100) 20 x 150 MG & 10 x '100MG'$  Tbpk Generic drug: nirmatrelvir & ritonavir Take 1 tablet by mouth 2 (two) times daily for 5 days.   predniSONE 10 MG tablet Commonly known as: DELTASONE Take 2 tablets (20 mg total) by mouth daily with breakfast for 3 days, THEN 1 tablet (10 mg total) daily with breakfast for 3 days, THEN 0.5 tablets (5 mg total) daily with breakfast for 4 days. Start taking on: July 30, 2022  rizatriptan 10 MG disintegrating tablet Commonly known as: MAXALT-MLT Take 10 mg by mouth as needed for migraine.   valACYclovir 500 MG tablet Commonly known as: VALTREX Take 500 mg by mouth daily as needed (outbreaks).   Xiidra 5 % Soln Generic drug: Lifitegrast Place 1 drop into both eyes in the morning and at bedtime.        Signature:  Chesley Mires, MD [place] Pager - [phone] 08/02/2022, 2:15 PM

## 2022-08-02 NOTE — Patient Instructions (Signed)
Follow up in 4 to 5 months

## 2022-08-07 MED ORDER — FASENRA PEN 30 MG/ML ~~LOC~~ SOAJ: 30.0000 mg | SUBCUTANEOUS | 1 refills | Status: DC

## 2022-08-07 NOTE — Addendum Note (Signed)
Addended by: Dessie Coma on: 08/07/2022 04:50 PM   Modules accepted: Orders

## 2022-08-09 ENCOUNTER — Other Ambulatory Visit: Payer: Self-pay | Admitting: Adult Health

## 2022-08-29 ENCOUNTER — Encounter: Payer: Self-pay | Admitting: Pulmonary Disease

## 2022-08-29 MED ORDER — PREDNISONE 10 MG PO TABS: ORAL_TABLET | ORAL | 0 refills | Status: AC

## 2022-08-29 NOTE — Telephone Encounter (Signed)
Hello, My grandson going to daycare is not a good thing!  He has RSV now.  Nd of course im mot old enough yet to have gotten the vaccine. I'm not sure when he was exposed. But I saw him Tuesday.  I'm going to the beach  on  Sat. And was wondering if it would a good idea to have a round of prednisone on hand just in case.  Let me know and I use [facility] in [place] if it needs to be sent in Thanks, Manning Charity  Dr. Halford Chessman please advise

## 2022-08-29 NOTE — Telephone Encounter (Signed)
Okay to send script for prednisone 10 mg pill >> 3 pills daily for 2 days, 2 pills daily for 2 days, 1 pill daily for 2 days.

## 2022-10-12 ENCOUNTER — Other Ambulatory Visit: Payer: Self-pay | Admitting: Adult Health

## 2022-11-11 ENCOUNTER — Other Ambulatory Visit: Payer: Self-pay | Admitting: Pulmonary Disease

## 2022-11-11 DIAGNOSIS — J4550 Severe persistent asthma, uncomplicated: Secondary | ICD-10-CM

## 2022-11-15 ENCOUNTER — Encounter: Payer: Self-pay | Admitting: Pulmonary Disease

## 2022-11-19 NOTE — Telephone Encounter (Signed)
  Hello,  I got a [facility]  concerning my RX for [place] [facility] [employer].  They need a new prescription .   I believe they will  need information to get it approved  as the current approval expires at the end of December. .  I will need my next dose before my follow-up appt. Thank you Manning Charity  Dr. Halford Chessman please advise, is it ok for [place] to refill patients fasenra?

## 2022-11-21 ENCOUNTER — Telehealth: Payer: Self-pay | Admitting: Pharmacist

## 2022-11-21 NOTE — Telephone Encounter (Signed)
Rx team/[facility]  please advise, can you assist [place] with getting patients fasenra refilled?

## 2022-11-21 NOTE — Telephone Encounter (Signed)
Submitted a Prior Authorization request to [facility] for [facility] via CoverMyMeds. Will update once we receive a response.  Key: [id number]

## 2022-11-21 NOTE — Telephone Encounter (Signed)
Okay to refill fasenra.  Pharmacy team might need to assist with any paperwork that is needed.

## 2022-11-22 NOTE — Telephone Encounter (Signed)
Received respones from [facility]. Authorization for Berna Bue is already active from 07/18/2021 through 05/25/2023.  Case # [id number]  Knox Saliva, PharmD, MPH, BCPS, CPP Clinical Pharmacist (Rheumatology and Pulmonology)

## 2023-01-02 IMAGING — MG MM DIGITAL SCREENING BILAT W/ TOMO AND CAD
6 of 10 series · 6 of 30 positions shown · non-contrast
Comparison: Previous exam(s).

CLINICAL DATA: Screening.

EXAM:
DIGITAL SCREENING BILATERAL MAMMOGRAM WITH TOMOSYNTHESIS AND CAD
TECHNIQUE: Bilateral screening digital craniocaudal and mediolateral oblique
mammograms were obtained. Bilateral screening digital breast
tomosynthesis was performed. The images were evaluated with
computer-aided detection.

[L CC synth-2D]
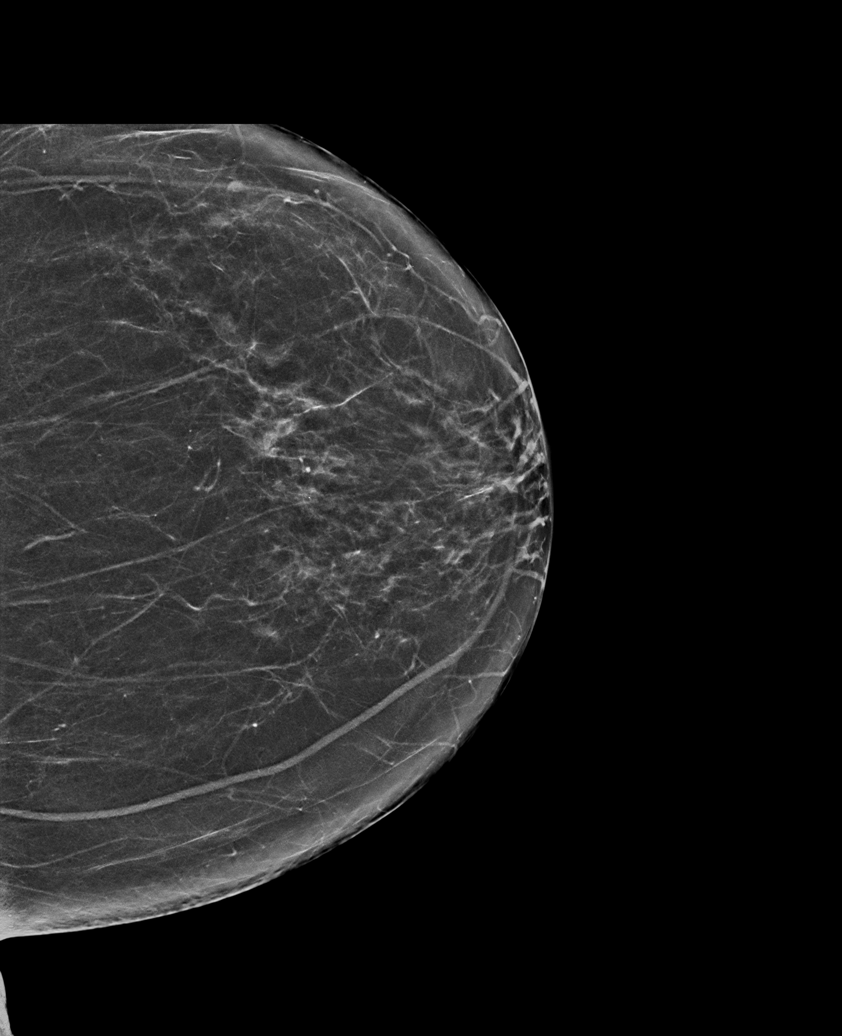

[R MLO synth-2D]
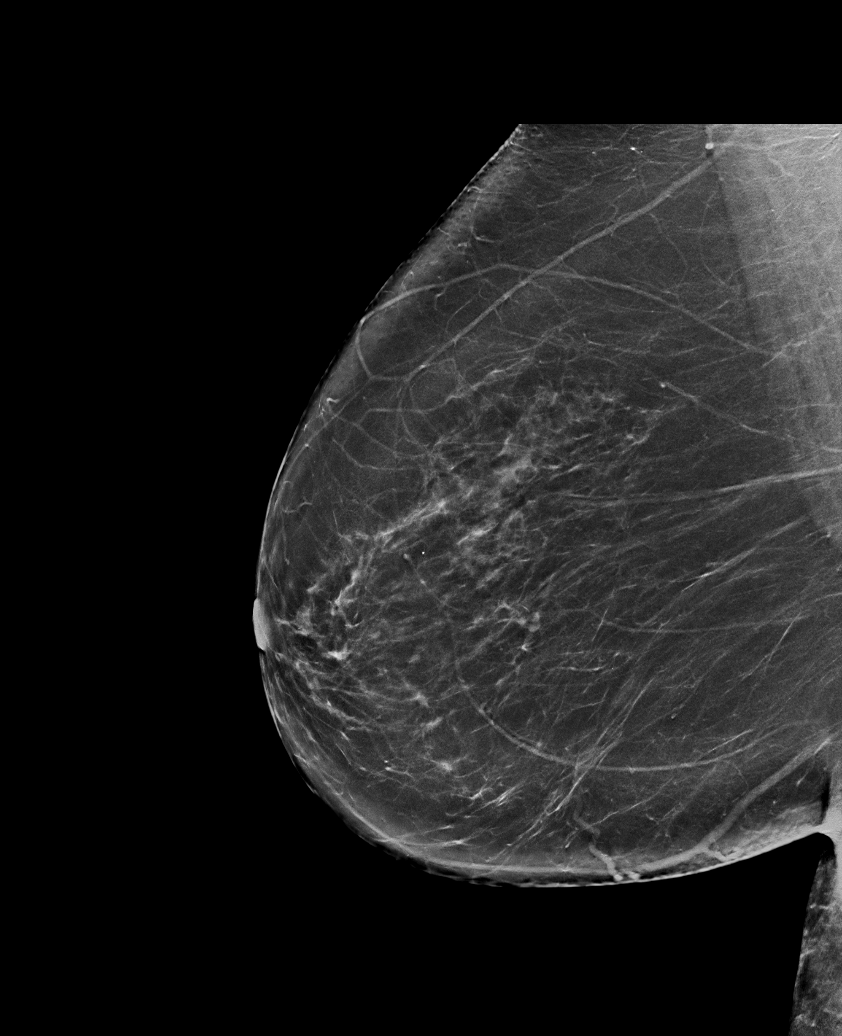

[L MLO synth-2D (1 of 2)]
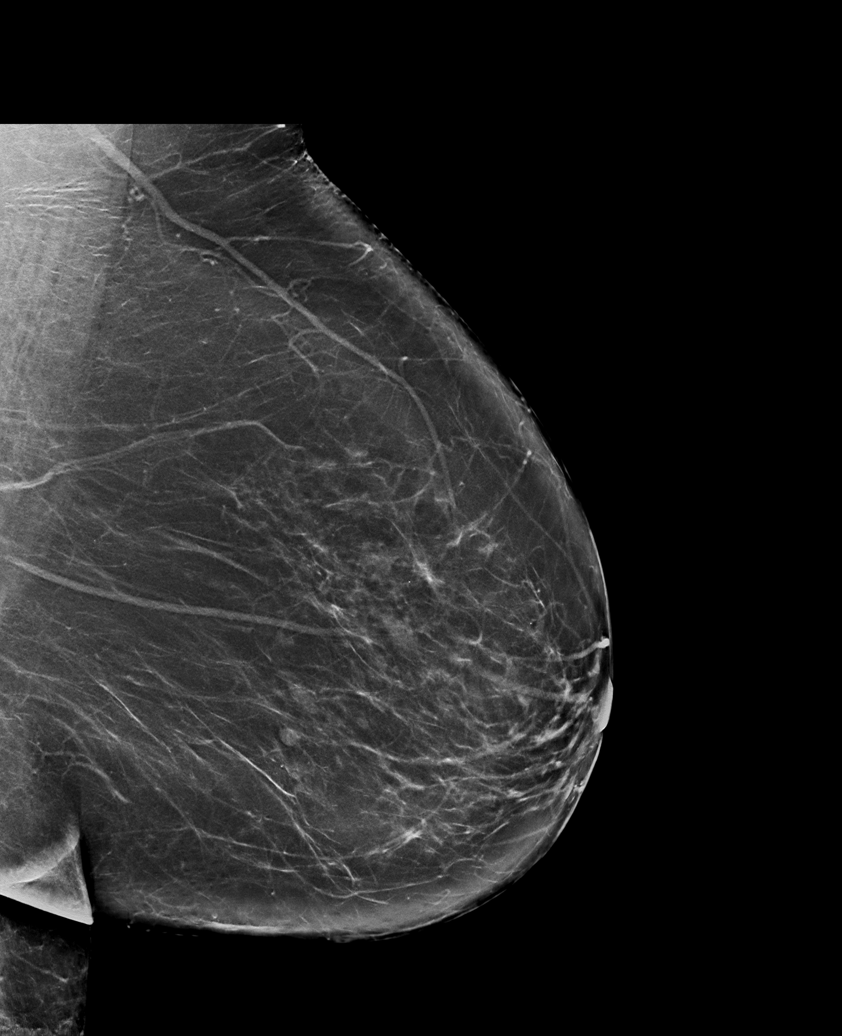

[R CC synth-2D]
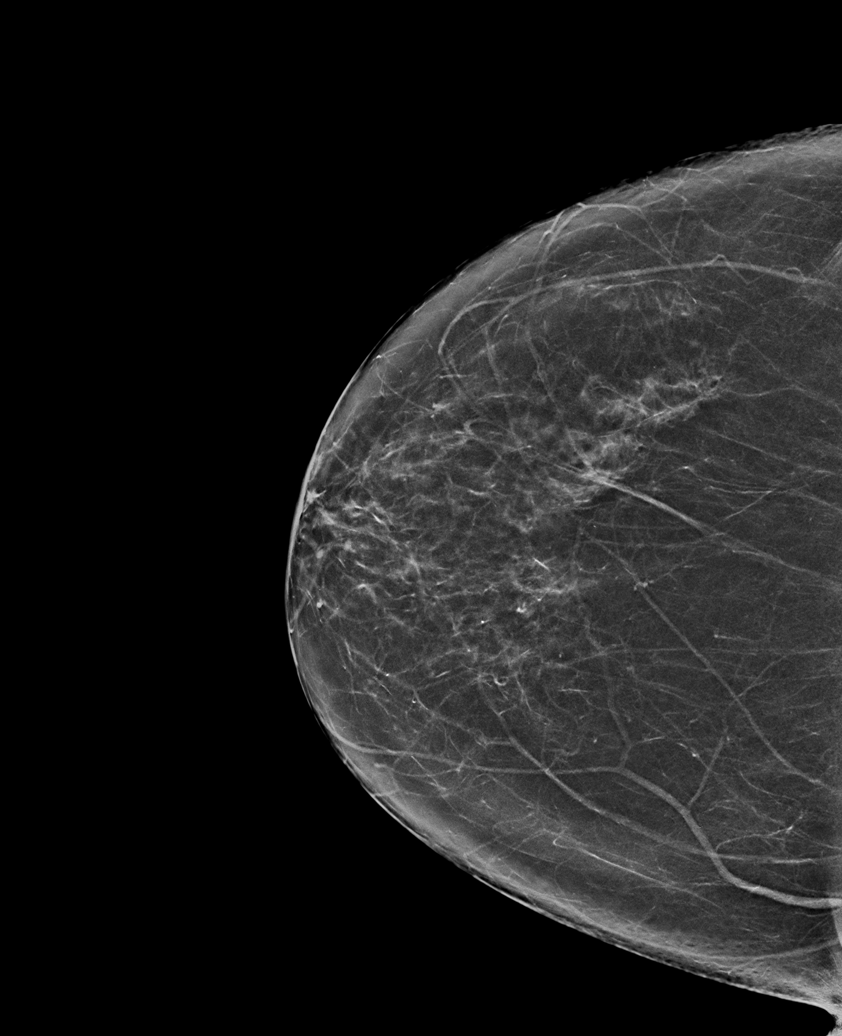

[L MLO synth-2D (2 of 2)]
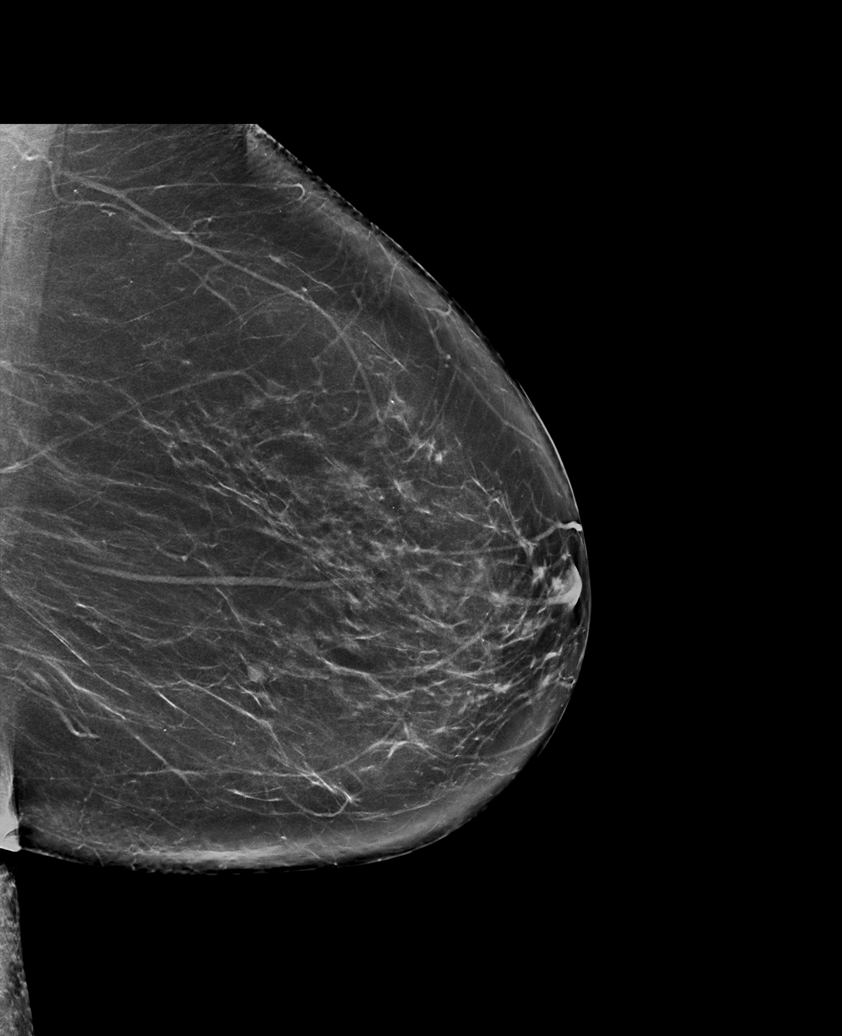

[L MLO tomo · tomo slice 41/80.0]
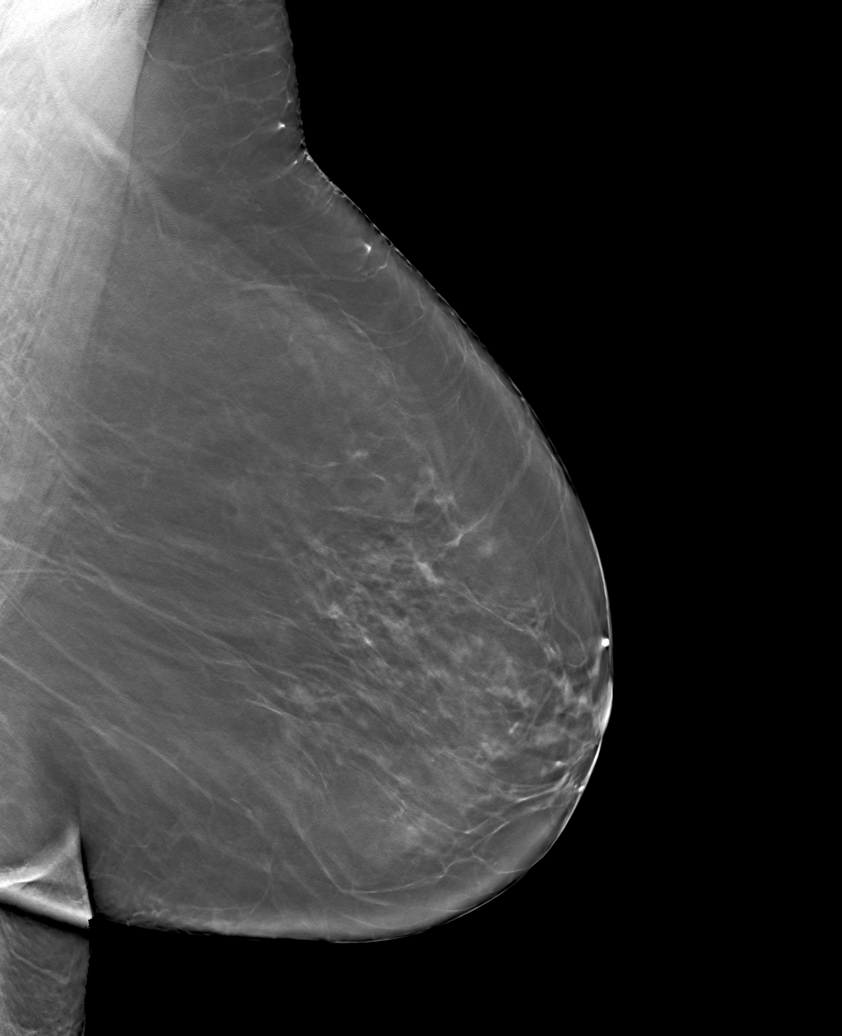

[6 of 30 positions shown; findings below may reference images not displayed]

ACR Breast Density Category b: There are scattered areas of
fibroglandular density.
FINDINGS: In the left breast, a possible mass warrants further evaluation. In
the right breast, no findings suspicious for malignancy.
IMPRESSION: Further evaluation is suggested for a possible mass in the left
breast.

RECOMMENDATION:
Diagnostic mammogram and possibly ultrasound of the left breast.
(Code:[id number]-X-44M)

The patient will be contacted regarding the findings, and additional
imaging will be scheduled.

BI-RADS CATEGORY  0: Incomplete. Need additional imaging evaluation
and/or prior mammograms for comparison.

## 2023-01-14 ENCOUNTER — Encounter: Payer: Self-pay | Admitting: Pulmonary Disease

## 2023-01-14 ENCOUNTER — Ambulatory Visit: Payer: BC Managed Care – PPO | Admitting: Pulmonary Disease

## 2023-01-14 VITALS — BP 122/68 | HR 100 | Temp 98.1°F | Ht 63.0 in | Wt 175.6 lb

## 2023-01-14 DIAGNOSIS — J479 Bronchiectasis, uncomplicated: Secondary | ICD-10-CM | POA: Diagnosis not present

## 2023-01-14 DIAGNOSIS — J4550 Severe persistent asthma, uncomplicated: Secondary | ICD-10-CM | POA: Diagnosis not present

## 2023-01-14 DIAGNOSIS — J301 Allergic rhinitis due to pollen: Secondary | ICD-10-CM | POA: Diagnosis not present

## 2023-01-14 NOTE — Progress Notes (Signed)
[facility]  Chief Complaint  Patient presents with   Follow-up    Breathing doing well not feeling well today     Past Surgical History:  She  has a past surgical history that includes Septoplasty; Tubal ligation; Abdominal hysterectomy; Replacement total knee bilateral; Nissen fundoplication; Endovenous ablation saphenous vein w/ laser (12-12-2011); Colonoscopy (03/12/2012); Tonsillectomy; Cataract extraction w/PHACO (07/13/2012); Cataract extraction; Cataract extraction w/PHACO (Left, 01/11/2013); Cesarean section; bilateral oopherectomy; Esophagogastroduodenoscopy (N/A, 06/01/2015); Back surgery; and Colonoscopy with propofol (N/A, 04/18/2022).   Past Medical History:  Cataract, Cystocele, Rectocele, Depression, GERD s/p Nissen fundoplication, HSV, Migraine headache, RLS, Varicose veins  Constitutional:  BP 122/68   Pulse 100   Temp 98.1 F (36.7 C) (Oral)   Ht 5' 3"$  (1.6 m)   Wt 175 lb 9.6 oz (79.7 kg)   SpO2 97% Comment: ra  BMI 31.11 kg/m   Brief Summary:  [name] is a 57 y.o. female with minimal smoking history has asthma with eosinophilic phenotype and chronic rhinitis.  She works as a Marine scientist.      Subjective:   She had COVID in September and had an asthma exacerbation from this.  She then got RSV in October, and needed a round of prednisone.  She has been feeling fatigued since.  She has intermittent GI symptoms.  Feels like her sinuses are acting up again.  She started using flonase and azelastine again.  Has mild scratch throat.  No fever.  Hasn't settled into her chest.   Physical Exam:   Appearance - well kempt   ENMT - no sinus tenderness, no oral exudate, no LAN, Mallampati 2 airway, no stridor  Respiratory - equal breath sounds bilaterally, no wheezing or rales  CV - s1s2 regular rate and rhythm, no murmurs  Ext - no clubbing, no edema  Skin - no rashes  Psych - normal mood and affect     Pulmonary  testing:  PFT 04/07/12 >> FEV1 2.65 (88%), FEV1% 78, TLC 5.03 (95%), DLCO 80%, +BD from FEF 25-75 PFT 05/29/21 >> FEV1 2.11 (80%), FEV1% 78, TLC 7.04 (139%), DLCO 77%  Chest Imaging:  CT chest 07/22/13 >> scarring in RML/Lingula/LLL, mild areas of BTX  Sleep Tests:  PSG 09/25/12 >> AHI 3.1, SpO2 low 84%   Social History:  She  reports that she quit smoking about 41 years ago. Her smoking use included cigarettes. She has a 0.50 pack-year smoking history. She has never used smokeless tobacco. She reports current alcohol use. She reports that she does not use drugs.  Family History:  Her family history includes Asthma in her child and maternal grandmother; Cancer in her paternal grandmother; Depression in her mother and sister; Diabetes in her mother; Diabetes type II in her mother; Fibromyalgia in her sister; Heart failure in her father; Hypertension in her brother, father, and sister; Migraines in her sister; Other in her sister.     Assessment/Plan:   Severe persistent asthma with eosinophilic phenotype. - started benralizumab (fasenra) 07/26/21; this has helped control her symptoms and reduce frequency of exacerbations; she is to continue benralizumab - continue breo 200 one puff daily, and singulair 10 mg nightly - prn albuterol - she keeps a stash of prednisone on hand, and knows to contact the office if she feels the need to start prednisone again  Allergic rhinitis. - reactions to dust, mildew, cats - previously tried allergy injections, but ineffective in controlling symptoms - continue flonase, azelastine, zyrtec, and  singulair - don't think she needs antibiotics or prednisone at this time >> discussed symptoms to monitor for and advised her to contact the office if her symptoms progress  History of mild regional bronchiectasis. - likely post infectious  - bronchial hygiene  History of COVID 19 infection. - from September 2023 - she received COVID vaccines, but not most  recent booster from Fall 2023 - discussed symptoms that would be consistent with long COVID  Time Spent Involved in Patient Care on Day of Examination:  35 minutes  Follow up:   Patient Instructions  Follow up in 6 months  Medication List:   Allergies as of 01/14/2023       Reactions   Hydrocodone Itching   Oxycodone-Itching,    Aminophylline Rash   Bactrim [sulfamethoxazole-trimethoprim] Rash   Pt has taken before but DS causes rash. Single strength does not   Nystatin Rash   Penicillins Rash        Medication List        Accurate as of January 14, 2023  8:48 AM. If you have any questions, ask your nurse or doctor.          STOP taking these medications    fluconazole 150 MG tablet Commonly known as: DIFLUCAN Stopped by: Chesley Mires, MD       TAKE these medications    albuterol 108 (90 Base) MCG/ACT inhaler Commonly known as: VENTOLIN HFA Inhale 2 puffs into the lungs every 6 (six) hours as needed. For shortness of breath   albuterol (2.5 MG/3ML) 0.083% nebulizer solution Commonly known as: PROVENTIL Take 3 mLs (2.5 mg total) by nebulization every 6 (six) hours as needed for wheezing or shortness of breath.   buPROPion 150 MG 12 hr tablet Commonly known as: WELLBUTRIN SR Take 150 mg by mouth 2 (two) times daily.   CALCIUM 600 + D PO Take 1 tablet by mouth daily.   cetirizine 10 MG tablet Commonly known as: ZYRTEC Take 10 mg by mouth daily.   dexlansoprazole 60 MG capsule Commonly known as: DEXILANT Take 1 capsule (60 mg total) by mouth daily.   Fasenra Pen 30 MG/ML Soaj Generic drug: Benralizumab INJECT 30MG SUBCUTANEOUSLY EVERY 8 WEEKS   fluticasone 50 MCG/ACT nasal spray Commonly known as: FLONASE Place 1 spray into the nose daily.   fluticasone furoate-vilanterol 200-25 MCG/INH Aepb Commonly known as: Breo Ellipta Inhale 1 puff into the lungs daily.   losartan 50 MG tablet Commonly known as: COZAAR Take 50 mg by mouth daily.    methocarbamol 500 MG tablet Commonly known as: ROBAXIN Take 500 mg by mouth every 8 (eight) hours as needed for muscle spasms.   montelukast 10 MG tablet Commonly known as: SINGULAIR Take 10 mg by mouth at bedtime.   rizatriptan 10 MG disintegrating tablet Commonly known as: MAXALT-MLT Take 10 mg by mouth as needed for migraine.   valACYclovir 500 MG tablet Commonly known as: VALTREX Take 500 mg by mouth daily as needed (outbreaks).   Xiidra 5 % Soln Generic drug: Lifitegrast Place 1 drop into both eyes in the morning and at bedtime.        Signature:  Chesley Mires, MD [place] Pager - [phone] 01/14/2023, 8:48 AM

## 2023-01-14 NOTE — Patient Instructions (Signed)
Follow up in 6 months 

## 2023-01-15 ENCOUNTER — Encounter: Payer: Self-pay | Admitting: Pulmonary Disease

## 2023-01-15 MED ORDER — AZITHROMYCIN 250 MG PO TABS: ORAL_TABLET | ORAL | 0 refills | Status: AC

## 2023-01-15 NOTE — Telephone Encounter (Signed)
  Hello.   Since I was in your office yesterday.  I have developed a fever (as high  as 102.5) and this morning  I have a cough  and shortness of breath.  Just reaching out. If you can call me in something that would be great.  Thank you  [name]

## 2023-01-15 NOTE — Telephone Encounter (Signed)
Please send script for Zpak.

## 2023-01-23 ENCOUNTER — Encounter: Payer: Self-pay | Admitting: Radiology

## 2023-01-27 ENCOUNTER — Other Ambulatory Visit: Payer: Self-pay | Admitting: Adult Health

## 2023-01-27 ENCOUNTER — Encounter: Payer: Self-pay | Admitting: Pulmonary Disease

## 2023-01-27 MED ORDER — PREDNISONE 10 MG PO TABS: ORAL_TABLET | ORAL | 0 refills | Status: AC

## 2023-01-27 NOTE — Telephone Encounter (Signed)
Please let her know I have sent script for prednisone.  She should call and schedule ROV if not improving.

## 2023-01-27 NOTE — Telephone Encounter (Signed)
Hello, I started  wheezing yesterday along with allergy symptoms.  I have had to use my rescue inhaler several times since yesterday   I'm wondering  if I need a short course of prednisone.  Patient also states she has some SOB/ denies cough  Please advise.

## 2023-02-05 NOTE — Telephone Encounter (Signed)
  Primary Pulmonologist: Dr. Halford Chessman  Last office visit and with whom: 01/14/2023 Dr. Halford Chessman  What do we see them for (pulmonary problems): Severe persistent asthma wo complication/ non seasonal allergic rhinitis due to pollen/ bronchiectasis  Last OV assessment/plan: see below   Was appointment offered to patient (explain)?  No available appts in [facility] office    Reason for call: From Mychart Message:  "Hello, I finished the prednisone and the z-pack prior to that.  I'm still wheezing and now I have a loose sounding cough, but not brining anything up. Get SOB walking around in the house.  I have  been taking  a neb treatment 1-2 times a day.  No fever that I'm aware of and home covid test  was negative.   Let me know if I need to do anything  else. Thank you"   (examples of things to ask: : When did symptoms start? Fever? Cough? Productive? Color to sputum? More sputum than usual? Wheezing? Have you needed increased oxygen? Are you taking your respiratory medications? What over the counter measures have you tried?)  Allergies  Allergen Reactions   Hydrocodone Itching    Oxycodone-Itching,    Aminophylline Rash   Bactrim [Sulfamethoxazole-Trimethoprim] Rash    Pt has taken before but DS causes rash. Single strength does not   Nystatin Rash   Penicillins Rash    Immunization History  Administered Date(s) Administered   Influenza Split 08/25/2013   Influenza,inj,Quad PF,6+ Mos 09/19/2018, 09/30/2021   Moderna Sars-Covid-2 Vaccination 12/21/2019, 01/18/2020, 10/13/2020, 06/03/2021, 09/30/2021   Pneumococcal Conjugate-13 08/31/2015   Zoster Recombinat (Shingrix) 09/19/2018, 01/09/2019    Assessment/Plan:    Severe persistent asthma with eosinophilic phenotype. - started benralizumab (fasenra) 07/26/21; this has helped control her symptoms and reduce frequency of exacerbations; she is to continue benralizumab - continue breo 200 one puff daily, and singulair 10 mg nightly - prn albuterol -  she keeps a stash of prednisone on hand, and knows to contact the office if she feels the need to start prednisone again   Allergic rhinitis. - reactions to dust, mildew, cats - previously tried allergy injections, but ineffective in controlling symptoms - continue flonase, azelastine, zyrtec, and singulair - don't think she needs antibiotics or prednisone at this time >> discussed symptoms to monitor for and advised her to contact the office if her symptoms progress   History of mild regional bronchiectasis. - likely post infectious  - bronchial hygiene   History of COVID 19 infection. - from September 2023 - she received COVID vaccines, but not most recent booster from Fall 2023 - discussed symptoms that wo

## 2023-02-24 ENCOUNTER — Other Ambulatory Visit: Payer: Self-pay | Admitting: Pulmonary Disease

## 2023-02-24 DIAGNOSIS — J4550 Severe persistent asthma, uncomplicated: Secondary | ICD-10-CM

## 2023-02-25 NOTE — Telephone Encounter (Signed)
Refill sent for [facility] to [facility]: [phone]   Dose: 30 mg SQ every 8 weeks  Last OV: 01/14/2023 Provider: Dr. Halford Chessman  Next OV: 6 months (not yet scheduled)  Knox Saliva, PharmD, MPH, BCPS Clinical Pharmacist (Rheumatology and Pulmonology)

## 2023-03-13 ENCOUNTER — Encounter: Payer: Self-pay | Admitting: Pulmonary Disease

## 2023-03-13 NOTE — Telephone Encounter (Signed)
Okay to send script for prednisone 10 mg pill >> 3 pills daily for 2 days, 2 pills daily for 2 days, 1 pill daily for 2 days. 

## 2023-03-18 ENCOUNTER — Other Ambulatory Visit: Payer: Self-pay

## 2023-03-18 MED ORDER — PREDNISONE 10 MG PO TABS: ORAL_TABLET | ORAL | 0 refills | Status: AC

## 2023-04-16 ENCOUNTER — Other Ambulatory Visit (HOSPITAL_COMMUNITY): Payer: Self-pay | Admitting: Adult Health

## 2023-04-16 DIAGNOSIS — Z1231 Encounter for screening mammogram for malignant neoplasm of breast: Secondary | ICD-10-CM

## 2023-04-28 ENCOUNTER — Ambulatory Visit (HOSPITAL_COMMUNITY)
Admission: RE | Admit: 2023-04-28 | Discharge: 2023-04-28 | Disposition: A | Discharge status: Home / Self Care | Payer: BC Managed Care – PPO | Source: Ambulatory Visit | Attending: Adult Health | Admitting: Adult Health

## 2023-04-28 DIAGNOSIS — Z1231 Encounter for screening mammogram for malignant neoplasm of breast: Secondary | ICD-10-CM | POA: Diagnosis present

## 2023-05-19 ENCOUNTER — Other Ambulatory Visit: Payer: Self-pay | Admitting: Adult Health

## 2023-05-19 MED ORDER — NITROFURANTOIN MONOHYD MACRO 100 MG PO CAPS: 100.0000 mg | ORAL_CAPSULE | Freq: Two times a day (BID) | ORAL | 0 refills | Status: DC

## 2023-05-19 NOTE — Progress Notes (Signed)
Rx sent for macrobid

## 2023-05-22 ENCOUNTER — Encounter: Payer: Self-pay | Admitting: Pulmonary Disease

## 2023-05-28 ENCOUNTER — Other Ambulatory Visit: Payer: Self-pay

## 2023-05-28 MED ORDER — ALBUTEROL SULFATE HFA 108 (90 BASE) MCG/ACT IN AERS: 2.0000 | INHALATION_SPRAY | Freq: Four times a day (QID) | RESPIRATORY_TRACT | 3 refills | Status: DC | PRN

## 2023-07-09 ENCOUNTER — Encounter: Payer: Self-pay | Admitting: Pulmonary Disease

## 2023-07-09 ENCOUNTER — Ambulatory Visit: Payer: BC Managed Care – PPO | Admitting: Pulmonary Disease

## 2023-07-09 VITALS — BP 121/87 | HR 102 | Ht 63.0 in | Wt 180.0 lb

## 2023-07-09 DIAGNOSIS — J4550 Severe persistent asthma, uncomplicated: Secondary | ICD-10-CM

## 2023-07-09 MED ORDER — FLUTICASONE FUROATE-VILANTEROL 100-25 MCG/ACT IN AEPB: 1.0000 | INHALATION_SPRAY | Freq: Every day | RESPIRATORY_TRACT | 0 refills | Status: DC

## 2023-07-09 NOTE — Progress Notes (Signed)
[facility]  Chief Complaint  Patient presents with   Severe persistent asthma without complication    Past Surgical History:  She  has a past surgical history that includes Septoplasty; Tubal ligation; Abdominal hysterectomy; Replacement total knee bilateral; Nissen fundoplication; Endovenous ablation saphenous vein w/ laser (12-12-2011); Colonoscopy (03/12/2012); Tonsillectomy; Cataract extraction w/PHACO (07/13/2012); Cataract extraction; Cataract extraction w/PHACO (Left, 01/11/2013); Cesarean section; bilateral oopherectomy; Esophagogastroduodenoscopy (N/A, 06/01/2015); Back surgery; and Colonoscopy with propofol (N/A, 04/18/2022).   Past Medical History:  Cataract, Cystocele, Rectocele, Depression, GERD s/p Nissen fundoplication, HSV, Migraine headache, RLS, Varicose veins  Constitutional:  BP 121/87   Pulse (!) 102   Ht 5\' 3"  (1.6 m)   Wt 180 lb (81.6 kg)   SpO2 95%   BMI 31.89 kg/m   Brief Summary:  [name] is a 57 y.o. female with minimal smoking history has asthma with eosinophilic phenotype and chronic rhinitis.  She works as a Engineer, civil (consulting).      Subjective:   Breathing has been okay.  Doesn't need albuterol much.  Not having cough, wheeze, or sputum.  Gets winded walking up a hill, but okay on level ground.  Sleeping okay.  No skin rashes.  Sinus okay.  Hasn't needed flonase.  Physical Exam:   Appearance - well kempt   ENMT - no sinus tenderness, no oral exudate, no LAN, Mallampati 2 airway, no stridor  Respiratory - equal breath sounds bilaterally, no wheezing or rales  CV - s1s2 regular rate and rhythm, no murmurs  Ext - no clubbing, no edema  Skin - no rashes  Psych - normal mood and affect     Pulmonary testing:  PFT 04/07/12 >> FEV1 2.65 (88%), FEV1% 78, TLC 5.03 (95%), DLCO 80%, +BD from FEF 25-75 PFT 05/29/21 >> FEV1 2.11 (80%), FEV1% 78, TLC 7.04 (139%), DLCO 77%  Chest Imaging:  CT chest 07/22/13 >>  scarring in RML/Lingula/LLL, mild areas of BTX  Sleep Tests:  PSG 09/25/12 >> AHI 3.1, SpO2 low 84%   Social History:  She  reports that she quit smoking about 41 years ago. Her smoking use included cigarettes. She started smoking about 43 years ago. She has a 0.5 pack-year smoking history. She has never used smokeless tobacco. She reports current alcohol use. She reports that she does not use drugs.  Family History:  Her family history includes Asthma in her child and maternal grandmother; Cancer in her paternal grandmother; Depression in her mother and sister; Diabetes in her mother; Diabetes type II in her mother; Fibromyalgia in her sister; Heart failure in her father; Hypertension in her brother, father, and sister; Migraines in her sister; Other in her sister.     Assessment/Plan:   Severe persistent asthma with eosinophilic phenotype. - started benralizumab (fasenra) 07/26/21; this has helped control her symptoms and reduce frequency of exacerbations; she is to continue benralizumab - will try changing to breo 100 one puff daily; she will call for a refill of this or if she wants to switch back to breo 200 one puff daily - continue singulair 10 mg nightly - prn albuterol - she keeps a stash of prednisone on hand, and knows to contact the office if she feels the need to start prednisone again  Allergic rhinitis. - reactions to dust, mildew, cats - previously tried allergy injections, but ineffective in controlling symptoms - continue flonase, azelastine, zyrtec, and singulair  History of mild regional bronchiectasis. - likely post infectious  -  bronchial hygiene   Time Spent Involved in Patient Care on Day of Examination:  27 minutes  Follow up:   Patient Instructions  Breo 100 one puff daily, and rinse your mouth after each use.  Call for a refill of this if your breathing stays okay.  Otherwise, call for a refill of higher breo dose.  Follow up in 6  months.  Medication List:   Allergies as of 07/09/2023       Reactions   Hydrocodone Itching   Oxycodone-Itching,    Aminophylline Rash   Bactrim [sulfamethoxazole-trimethoprim] Rash   Pt has taken before but DS causes rash. Single strength does not   Nystatin Rash   Penicillins Rash        Medication List        Accurate as of July 09, 2023  3:14 PM. If you have any questions, ask your nurse or doctor.          STOP taking these medications    fluconazole 150 MG tablet Commonly known as: DIFLUCAN Stopped by:     fluticasone furoate-vilanterol 200-25 MCG/INH Aepb Commonly known as: Breo Ellipta Replaced by: fluticasone furoate-vilanterol 100-25 MCG/ACT Aepb Stopped by: Coralyn Helling   nitrofurantoin (macrocrystal-monohydrate) 100 MG capsule Commonly known as: MACROBID Stopped by: Coralyn Helling       TAKE these medications    albuterol (2.5 MG/3ML) 0.083% nebulizer solution Commonly known as: PROVENTIL Take 3 mLs (2.5 mg total) by nebulization every 6 (six) hours as needed for wheezing or shortness of breath.   albuterol 108 (90 Base) MCG/ACT inhaler Commonly known as: VENTOLIN HFA Inhale 2 puffs into the lungs every 6 (six) hours as needed. For shortness of breath   buPROPion 150 MG 24 hr tablet Commonly known as: WELLBUTRIN XL Take 450 mg by mouth daily. What changed: Another medication with the same name was removed. Continue taking this medication, and follow the directions you see here. Changed by: Coralyn Helling   CALCIUM 600 + D PO Take 1 tablet by mouth daily.   cetirizine 10 MG tablet Commonly known as: ZYRTEC Take 10 mg by mouth daily.   dexlansoprazole 60 MG capsule Commonly known as: DEXILANT Take 1 capsule (60 mg total) by mouth daily.   Fasenra Pen 30 MG/ML prefilled autoinjector Generic drug: benralizumab INJECT 30MG  SUBCUTANEOUSLY EVERY 8 WEEKS   fluticasone 50 MCG/ACT nasal spray Commonly known as: FLONASE Place 1  spray into the nose daily.   fluticasone furoate-vilanterol 100-25 MCG/ACT Aepb Commonly known as: Breo Ellipta Inhale 1 puff into the lungs daily. Replaces: fluticasone furoate-vilanterol 200-25 MCG/INH Aepb Started by: Coralyn Helling   losartan 50 MG tablet Commonly known as: COZAAR Take 50 mg by mouth daily.   methocarbamol 500 MG tablet Commonly known as: ROBAXIN Take 500 mg by mouth every 8 (eight) hours as needed for muscle spasms.   montelukast 10 MG tablet Commonly known as: SINGULAIR Take 10 mg by mouth at bedtime.   pantoprazole 40 MG tablet Commonly known as: PROTONIX Take 40 mg by mouth daily.   rizatriptan 10 MG disintegrating tablet Commonly known as: MAXALT-MLT Take 10 mg by mouth as needed for migraine.   valACYclovir 500 MG tablet Commonly known as: VALTREX Take 500 mg by mouth daily as needed (outbreaks).   Xiidra 5 % Soln Generic drug: Lifitegrast Place 1 drop into both eyes in the morning and at bedtime.        Signature:  Coralyn Helling, MD [facility] Pulmonary/Critical Care Pager - [phone] 07/09/2023, 3:14 PM

## 2023-07-09 NOTE — Patient Instructions (Signed)
Breo 100 one puff daily, and rinse your mouth after each use.  Call for a refill of this if your breathing stays okay.  Otherwise, call for a refill of higher breo dose.  Follow up in 6 months.

## 2023-08-05 ENCOUNTER — Encounter: Payer: Self-pay | Admitting: Pulmonary Disease

## 2023-08-05 MED ORDER — PREDNISONE 10 MG PO TABS: ORAL_TABLET | ORAL | 0 refills | Status: AC

## 2023-08-11 ENCOUNTER — Other Ambulatory Visit: Payer: Self-pay | Admitting: Pulmonary Disease

## 2023-08-11 DIAGNOSIS — J4550 Severe persistent asthma, uncomplicated: Secondary | ICD-10-CM

## 2023-08-28 ENCOUNTER — Other Ambulatory Visit: Payer: Self-pay

## 2023-08-28 MED ORDER — FLUTICASONE FUROATE-VILANTEROL 200-25 MCG/ACT IN AEPB: 1.0000 | INHALATION_SPRAY | Freq: Every day | RESPIRATORY_TRACT | 1 refills | Status: DC

## 2023-08-28 MED ORDER — FLUTICASONE FUROATE-VILANTEROL 200-25 MCG/ACT IN AEPB: 1.0000 | INHALATION_SPRAY | Freq: Every day | RESPIRATORY_TRACT | 0 refills | Status: DC

## 2023-09-08 ENCOUNTER — Other Ambulatory Visit: Payer: Self-pay | Admitting: Adult Health

## 2023-09-08 MED ORDER — VALACYCLOVIR HCL 500 MG PO TABS: 500.0000 mg | ORAL_TABLET | Freq: Every day | ORAL | 1 refills | Status: DC | PRN

## 2023-09-08 NOTE — Progress Notes (Signed)
Refilled valtrex

## 2023-09-09 ENCOUNTER — Other Ambulatory Visit (HOSPITAL_COMMUNITY): Payer: Self-pay

## 2023-09-09 NOTE — Telephone Encounter (Signed)
Pt stated that she is needed a PA on  her Harrington Challenger  have you received a PA for this

## 2023-09-09 NOTE — Telephone Encounter (Signed)
Specialty PA

## 2023-09-11 ENCOUNTER — Telehealth: Payer: Self-pay | Admitting: Adult Health

## 2023-09-11 NOTE — Telephone Encounter (Signed)
Called to clarify how taking valtrex

## 2023-09-12 ENCOUNTER — Telehealth: Payer: Self-pay

## 2023-09-12 NOTE — Telephone Encounter (Signed)
Submitted an URGENT Prior Authorization request to [facility] for [facility] via CoverMyMeds. Will update once we receive a response.  Key: [id number]

## 2023-09-12 NOTE — Telephone Encounter (Signed)
Hey the patient reach out this morning regarding her PA  wanting to know the status. Any updates

## 2023-09-12 NOTE — Telephone Encounter (Signed)
Received a fax regarding Prior Authorization from [facility] for [facility]. Authorization has been DENIED because: 1. Submission of medical records (e.g., chart notes, lab values) confirming asthma is an eosinophilic phenotype as defined by a baseline (pre-treatment) peripheral blood eosinophil level greater than or equal to 150 cells per microliter 2. Paid claims or submission of medical records (e.g., chart notes) confirming patient is currently being treated, for at least 3 months, with one of the following unless there is a contraindication (you can't tolerate) or intolerance to these medications: a. High-dose inhaled corticosteroid (ICS) [e.g., greater than 500 mcg fluticasone propionate equivalent/day] b. One maximally-dosed combination ICS/LABA product (e.g., Advair [fluticasone propionate 538mcg/ salmeterol 14mcg], Symbicort [budesonide 164mcg/ formoterol 4.15mcg], Breo Ellipta [fluticasone 262mcg/ vilanterol 84mcg])  Urgent e-appeal submittted via CMM today  Chesley Mires, PharmD, MPH, BCPS, CPP Clinical Pharmacist (Rheumatology and Pulmonology)

## 2023-09-15 NOTE — Telephone Encounter (Signed)
Please advise on this PA. Patient is unsure where it stands.

## 2023-09-16 ENCOUNTER — Other Ambulatory Visit (HOSPITAL_COMMUNITY): Payer: Self-pay

## 2023-09-16 NOTE — Telephone Encounter (Signed)
Called [employer] for Harrington Challenger appeal status. Per rep, denial was UPHELD for same reasoning: Your provider has prescribed Harrington Challenger to treat your asthma. As indicated in the guidelines, your request was previously denied because there must be paid claims or submission of medical records (e.g., chart notes) confirming you are currently being treated, for at least 3 months, with one of the following unless there is a medical reason to avoid or intolerance to these medications: 1) high-dose inhaled corticosteroid (ICS) and an additional asthma controller medication; or 2) one maximally-dosed combination ICS/LABA product (e.g., Advair [fluticasone propionate 551mcg/ salmeterol 23mcg], Symbicort [budesonide 1107mcg/ formoterol 4.63mcg], Breo Ellipta [fluticasone 226mcg/ vilanterol 3mcg]).  After reviewing the information submitted by your provider, we have determined that you do not meet the criteria listed above, and an exception cannot be made in your case since the medical literature supports the guideline criteria. Therefore, we cannot approve your request for Harrington Challenger, as requested.  Harrington Challenger is only approved as an ADD-ON to maintenance inhaler regimen. Breo dispense history as below:  MyChart message sent to pt requesting proof if she has of filling Breo consistently over past year.  Phone: [phone]  Will need to work on second appeal  Chesley Mires, PharmD, MPH, BCPS, CPP Clinical Pharmacist (Rheumatology and Pulmonology)

## 2023-10-07 ENCOUNTER — Other Ambulatory Visit: Payer: Self-pay | Admitting: Adult Health

## 2023-10-25 ENCOUNTER — Other Ambulatory Visit: Payer: Self-pay | Admitting: Internal Medicine

## 2023-11-08 ENCOUNTER — Other Ambulatory Visit: Payer: Self-pay | Admitting: Adult Health

## 2024-01-01 ENCOUNTER — Other Ambulatory Visit: Payer: Self-pay | Admitting: Pulmonary Disease

## 2024-01-19 ENCOUNTER — Encounter: Payer: Self-pay | Admitting: Internal Medicine

## 2024-01-20 NOTE — Telephone Encounter (Signed)
 Ok to add on at end of day today or tomorrow but but needs to be sure to bring all active meds/ inhalers / solutions

## 2024-01-20 NOTE — Telephone Encounter (Signed)
 Called and spoke with patient  offer her an appt for this after at 28 as work in pt  stated that she was  feeling  better. She thinks that she could wait until her appt on the 13th . Also to here that we could possible work here in on 01/21/24 at 115pm if she goes though the day start having SOB she can call us back to be worked in on that day and time.Patient stated that she will let us know.

## 2024-02-03 NOTE — Progress Notes (Unsigned)
 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 Status >mech vent age 58 and some better but still requiring abx/ pred 3-4 times per year since then rx [facility]/ [facility] dx dust/ mildew / animals on shots since around 2013 but referred by Dr Willa Rough 08/02/2013 to pulmonary clinic for refractory sob and abn ct chest c/w bronchiectasis.   08/02/2013 1st [facility] office visit/ [place] [facility]oste cc  2  mo  recurrrent resting paroxyms of sob somewhat different than before esp when speaking and no better with abx or prednisone. Assoc with  Unusual  Dry cough esp when lie down, no better with albuterol but maintaining on qvar and dulera. Had been on dexilant until 2-3 m  prior to OV (for atypical cp which was better since feb 2014)  Sp NF > 10 y  prior to OV  -  C/o Doe x 100 ft - on ppi with bfast and hs.  Not aware of a problem while sleeping or in am's just immediately on lying down rec Take prilosec 30- 60 min before your first and last meals of the day (or one dexilant 60 before bfast)  and pepcid ac 20 mg at bedtime GERD  .  Take delsym two tsp every 12 hours and supplement if needed with  tramadol 50 mg up to 2 every 4 hours to suppress the urge to cough. Once you have eliminated the cough for 3 straight days try reducing the tramadol first    08/30/2013 f/u ov/Heidi Lemay re: cough ? Asthma vs uacs Chief Complaint  Patient presents with   Follow-up    Pt states that her breathing is much improved since the last visit. Her cough had resolved, but had to recently start back on delsym and tramadol since weather change.  on dulera and qvar not limited by sob and no saba need at all Rec Try off dulera - if symptoms worsen resume dulera immediately  Work on Musician technique:   Ok to use  delsym as much as you want but if you need tramadol regularly we will need to see you back here for further evaluation as the delsym should be adequate   02/04/2024  f/u ov/[place] [facility]/Mclain Freer re: ? Cough variant asthma  maint on BREO  200/ singulair / prednisone up to 3 course per year  Chief Complaint  Patient presents with   Follow-up    New patient former patient of Dr Craige Cotta   Dyspnea:  mainly walking x 15 min / taking care of her mother   Cough: not much  Sleeping: bed is flat/ one pillow s    resp cc  SABA use: 2-3 x per week  hfa/ rarely neb  02: none    No obvious day to day or daytime variability or assoc excess/ purulent sputum or mucus plugs or hemoptysis or cp or chest tightness, subjective wheeze or overt sinus or hb symptoms.    Also denies any obvious fluctuation of symptoms with weather or environmental changes or other aggravating or alleviating factors except as outlined above   No unusual exposure hx or h/o childhood pna or h/o premature birth  Current Allergies, Complete Past Medical History, Past Surgical History, Family History, and Social History  were reviewed in [facility] electronic medical record.   ROS  The following are not active complaints unless bolded Hoarseness, sore throat, dysphagia, dental problems, itching, sneezing,  nasal congestion or discharge of excess mucus or purulent secretions, ear ache,   fever, chills, sweats, unintended wt loss or wt gain, classically pleuritic or exertional cp,  orthopnea pnd or arm/hand swelling  or leg swelling, presyncope, palpitations, abdominal pain, anorexia, nausea, vomiting, diarrhea  or change in bowel habits or change in bladder habits, change in stools or change in urine, dysuria, hematuria,  rash, arthralgias, visual complaints, headache, numbness, weakness or ataxia or problems with walking or coordination,  change in mood or  memory.        Current Meds  Medication Sig   albuterol (PROVENTIL) (2.5  MG/3ML) 0.083% nebulizer solution Take 3 mLs (2.5 mg total) by nebulization every 6 (six) hours as needed for wheezing or shortness of breath.   albuterol (VENTOLIN HFA) 108 (90 Base) MCG/ACT inhaler Inhale 2 puffs into the lungs every 6 (six) hours as needed. For shortness of breath   buPROPion (WELLBUTRIN XL) 150 MG 24 hr tablet Take 450 mg by mouth daily.   Calcium Carb-Cholecalciferol (CALCIUM 600 + D PO) Take 1 tablet by mouth daily.   cetirizine (ZYRTEC) 10 MG tablet Take 10 mg by mouth daily.   dexlansoprazole (DEXILANT) 60 MG capsule Take 1 capsule (60 mg total) by mouth daily.   fluconazole (DIFLUCAN) 150 MG tablet TAKE ONE TABLET BY MOUTH AS A ONE-TIME DOSE MAY REPEAT IN 3 DAYS IF SYMPTOMS PERSIST.   fluticasone (FLONASE) 50 MCG/ACT nasal spray Place 1 spray into the nose daily.    fluticasone furoate-vilanterol (BREO ELLIPTA) 200-25 MCG/ACT AEPB USE 1 INHALATION BY MOUTH DAILY   losartan (COZAAR) 50 MG tablet Take 50 mg by mouth daily.   methocarbamol (ROBAXIN) 500 MG tablet Take 500 mg by mouth every 8 (eight) hours as needed for muscle spasms.   montelukast (SINGULAIR) 10 MG tablet Take 10 mg by mouth at bedtime.   pantoprazole (PROTONIX) 40 MG tablet Take 40 mg by mouth daily.   rizatriptan (MAXALT-MLT) 10 MG disintegrating tablet Take 10 mg by mouth as needed for migraine.   valACYclovir (VALTREX) 500 MG tablet Take 1 tablet (500 mg total) by mouth daily as needed (outbreaks).   XIIDRA 5 % SOLN Place 1 drop into both eyes in the morning and at bedtime.   [DISCONTINUED] fluticasone furoate-vilanterol (BREO ELLIPTA) 100-25 MCG/ACT AEPB Inhale 1 puff into the lungs daily.                 Objective:   Physical Exam  Wt   02/04/2024       164   08/30/2013       169     08/02/13 177 lb 6.4 oz (80.468 kg)  06/17/13 174 lb (78.926 kg)  03/30/13 172 lb (78.019 kg)    Vital signs reviewed  02/04/2024  - Note at rest 02 sats  96% on RA   General appearance:    amb slt  cushingnoid appearing wf nad    HEENT : Oropharynx  clear      Nasal turbinates nl    NECK :  without  apparent JVD/ palpable Nodes/TM    LUNGS: no acc muscle use,  Nl contour chest which is clear to A and P bilaterally without cough on insp or exp maneuvers   CV:  RRR  no s3 or murmur or increase in  P2, and no edema   ABD:  soft and nontender   MS:  Gait nl   ext warm without deformities Or obvious joint restrictions  calf tenderness, cyanosis or clubbing    SKIN: warm and dry without lesions    NEURO:  alert, approp, nl sensorium with  no motor or cerebellar deficits apparent.       CT chest  07/22/13  1. No acute cardiopulmonary abnormalities.  2. Bilateral areas of parenchymal scarring and mild bronchiectasis  identified. Findings are likely the sequelae of chronic  inflammation or infection.    Assessment & Plan:

## 2024-02-04 ENCOUNTER — Ambulatory Visit: Payer: BC Managed Care – PPO | Admitting: Internal Medicine

## 2024-02-04 ENCOUNTER — Encounter: Payer: Self-pay | Admitting: Internal Medicine

## 2024-02-04 VITALS — BP 124/79 | HR 87 | Ht 63.0 in | Wt 164.8 lb

## 2024-02-04 DIAGNOSIS — J45909 Unspecified asthma, uncomplicated: Secondary | ICD-10-CM

## 2024-02-04 DIAGNOSIS — Z87891 Personal history of nicotine dependence: Secondary | ICD-10-CM | POA: Diagnosis not present

## 2024-02-04 NOTE — Patient Instructions (Addendum)
 Please remember to go to the lab department   for your tests - we will call you with the results when they are available.  No change in medications   Also  Ok to try albuterol 15 min before an activity (on alternating days)  that you know would usually make you short of breath and see if it makes any difference and if makes none then don't take albuterol after activity unless you can't catch your breath as this means it's the resting that helps, not the albuterol.  Please schedule a follow up visit in 6 months but call sooner if needed

## 2024-02-04 NOTE — Assessment & Plan Note (Signed)
 Onset in childhood/ failed to improve on allergy shots [facility])  -PFT 05/29/21 >> FEV1 2.11 (80%),  ratio 0.78, TLC 7.04 (139%), DLCO 77%  ERV 22% @ wt 178 and very minimal curvature - started benralizumab (fasenra) 07/26/21  x one year but then denied by insurance as stopped Breo  10/2023  - Allergy screen 02/04/2024 >  Eos 0. /  IgE  pending   So far her prednisone need has not increased off biologics but her ex tolerance is down a bit and she's maint on breo 200/ singulair daily with less confidence her saba s helping her sob   Re SABA :  I spent extra time with pt today reviewing appropriate use of albuterol for prn use on exertion with the following points: 1) saba is for relief of sob that does not improve by walking a slower pace or resting but rather if the pt does not improve after trying this first. 2) If the pt is convinced, as many are, that saba helps recover from activity faster then it's easy to tell if this is the case by re-challenging : ie stop, take the inhaler, then p 5 minutes try the exact same activity (intensity of workload) that just caused the symptoms and see if they are substantially diminished or not after saba 3) if there is an activity that reproducibly causes the symptoms, try the saba 15 min before the activity on alternate days   If in fact the saba really does help, then fine to continue to use it prn but advised may need to look closer at the maintenance regimen(for now breeo/ singulair)  being used to achieve better control of airways disease with exertion.   Discussed in detail all the  indications, usual  risks and alternatives  relative to the benefits with patient who agrees to proceed with Rx as outlined.       F/u q 67m, sooner prn     Each maintenance medication was reviewed in detail including emphasizing most importantly the difference between maintenance and prns and under what circumstances the prns are to be triggered using an action plan format  where appropriate.  Total time for H and P, chart review, counseling, reviewing hfa/dpi/neb  device(s) and generating customized AVS unique to this office visit / same day charting = 42 min for pt not seen in over 10 years

## 2024-02-08 LAB — CBC WITH DIFFERENTIAL/PLATELET
Basophils Absolute: 0.2 10*3/uL (ref 0.0–0.2)
Basos: 2 %
EOS (ABSOLUTE): 0.5 10*3/uL — ABNORMAL HIGH (ref 0.0–0.4)
Eos: 5 %
Hematocrit: 43.4 % (ref 34.0–46.6)
Hemoglobin: 14.6 g/dL (ref 11.1–15.9)
Immature Grans (Abs): 0.1 10*3/uL (ref 0.0–0.1)
Immature Granulocytes: 1 %
Lymphocytes Absolute: 3.4 10*3/uL — ABNORMAL HIGH (ref 0.7–3.1)
Lymphs: 34 %
MCH: 29.9 pg (ref 26.6–33.0)
MCHC: 33.6 g/dL (ref 31.5–35.7)
MCV: 89 fL (ref 79–97)
Monocytes Absolute: 0.9 10*3/uL (ref 0.1–0.9)
Monocytes: 9 %
Neutrophils Absolute: 4.9 10*3/uL (ref 1.4–7.0)
Neutrophils: 49 %
Platelets: 342 10*3/uL (ref 150–450)
RBC: 4.89 x10E6/uL (ref 3.77–5.28)
RDW: 13.9 % (ref 11.7–15.4)
WBC: 9.8 10*3/uL (ref 3.4–10.8)

## 2024-02-08 LAB — IGE: IgE (Immunoglobulin E), Serum: 98 [IU]/mL (ref 6–495)

## 2024-02-09 ENCOUNTER — Other Ambulatory Visit: Payer: Self-pay

## 2024-02-09 DIAGNOSIS — J4550 Severe persistent asthma, uncomplicated: Secondary | ICD-10-CM

## 2024-02-09 DIAGNOSIS — J45909 Unspecified asthma, uncomplicated: Secondary | ICD-10-CM

## 2024-03-14 ENCOUNTER — Other Ambulatory Visit: Payer: Self-pay | Admitting: Adult Health

## 2024-03-15 ENCOUNTER — Ambulatory Visit: Payer: Self-pay | Admitting: Internal Medicine

## 2024-03-15 ENCOUNTER — Other Ambulatory Visit: Payer: Self-pay

## 2024-03-15 ENCOUNTER — Encounter: Payer: Self-pay | Admitting: Internal Medicine

## 2024-03-15 VITALS — BP 106/80 | HR 99 | Temp 98.7°F | Ht 63.39 in | Wt 165.4 lb

## 2024-03-15 DIAGNOSIS — J4540 Moderate persistent asthma, uncomplicated: Secondary | ICD-10-CM

## 2024-03-15 DIAGNOSIS — J310 Chronic rhinitis: Secondary | ICD-10-CM

## 2024-03-15 MED ORDER — CETIRIZINE HCL 10 MG PO TABS: 10.0000 mg | ORAL_TABLET | Freq: Every day | ORAL | 1 refills | Status: DC

## 2024-03-15 MED ORDER — AZELASTINE HCL 0.1 % NA SOLN: 2.0000 | Freq: Two times a day (BID) | NASAL | 5 refills | Status: DC | PRN

## 2024-03-15 MED ORDER — ALBUTEROL SULFATE HFA 108 (90 BASE) MCG/ACT IN AERS
2.0000 | INHALATION_SPRAY | Freq: Four times a day (QID) | RESPIRATORY_TRACT | 1 refills | Status: DC | PRN
Start: 2024-03-15 — End: 2024-08-02

## 2024-03-15 MED ORDER — MONTELUKAST SODIUM 10 MG PO TABS: 10.0000 mg | ORAL_TABLET | Freq: Every day | ORAL | 1 refills | Status: DC

## 2024-03-15 MED ORDER — FLUTICASONE PROPIONATE 50 MCG/ACT NA SUSP: 2.0000 | Freq: Every day | NASAL | 5 refills | Status: DC

## 2024-03-15 NOTE — Patient Instructions (Addendum)
 Moderate Persistent Asthma: - Maintenance inhaler: continue Breo 200-25mcg 1 puff daily.   - Rescue inhaler: Albuterol  2 puffs via spacer or 1 vial via nebulizer every 4-6 hours as needed for respiratory symptoms of cough, shortness of breath, or wheezing Asthma control goals:  Full participation in all desired activities (may need albuterol  before activity) Albuterol  use two times or less a week on average (not counting use with activity) Cough interfering with sleep two times or less a month Oral steroids no more than once a year No hospitalizations   Chronic Rhinitis: - If symptoms worsen, consider allergy testing.   - Use nasal saline rinses before nose sprays such as with Neilmed Sinus Rinse.  Use distilled water .   - Use Flonase  2 sprays each nostril daily. Aim upward and outward. - Use Azelastine  2 sprays each nostril twice daily as needed for runny nose, drainage, sneezing, congestion. Aim upward and outward. - Use Zyrtec  10 mg daily.  - Use Singulair  10mg  daily.  Stop if there are any mood/behavioral changes.

## 2024-03-15 NOTE — Progress Notes (Signed)
 NEW PATIENT  Date of Service/Encounter:  03/15/24  Consult requested by: Artemisa Bile, MD   Subjective:   [name] (DOB: 1966-02-27) is a 58 y.o. female who presents to the clinic on 03/15/2024 with a chief complaint of Asthma and Allergies .    History obtained from: chart review and patient.   Asthma:  Diagnosed at a very young age around 1.  Recalls having freqeunt ER/urgent care visits when young and being hospitalized; never intubated.  Does now note some dyspnea with activity.  Uses Breo daily.  Used to be on Fasenra ; does not think she is any worse since being off of it.  Some weeks does not need Albuterol  and other weeks 2-3x depending on activity.  Using rescue inhaler: few times a month  Limitations to daily activity: mild 0 ED visits/UC visits and 3 oral steroids in the past year Multiple number of lifetime hospitalizations, 0 number of lifetime intubations.  Identified Triggers: exercise and respiratory illness Prior PFTs or spirometry: 05/29/2021: self interpreted spirometry without obstruction, no BD reversibility.   Current regimen:  Maintenance: Breo 200-25mcg 1 puff daily, Singulair   Rescue: Albuterol  2 puffs q4-6 hrs PRN  Rhinitis:  Started since she was young.  Symptoms include: nasal congestion, rhinorrhea, and post nasal drainage  Occurs seasonally-Spring/Summer  Potential triggers: not sure  Treatments tried:  Was on AIT in the past but did not help  Azelastine /Flonase  PRN Zyrtec  daily   Previous allergy testing: yes; long time ago History of sinus surgery: septoplasty in 1990s  Nonallergic triggers: none      Reviewed:  05/29/2021: self interpreted spirometry without obstruction, no BD reversibility.   02/04/2024: seen by Pulm Dr Waymond Hailey for moderate persistent asthma, on Breo.  Previously on Fasenra  that was discontinued by insurance due to not filling Breo on time. Referred to Allergy.   01/2024: AEC 500  Past Medical History: Past Medical  History:  Diagnosis Date   Allergy    Asthma    dxed at age 31   Cataracts, bilateral    Constipation 10/29/2013   Cystocele 10/29/2013   Depression    GERD (gastroesophageal reflux disease)    Hemorrhoid 01/12/2015   Herpes simplex virus (HSV) infection    Migraine    last migraine:01/06/13   Migraines 03/30/2013   Rectocele 10/29/2013   Restless leg syndrome    Shortness of breath    SOB with exertion   SUI (stress urinary incontinence, female)    Vaginal dryness 04/05/2016   Vaginal pain 04/05/2016   Varicose veins      Past Surgical History: Past Surgical History:  Procedure Laterality Date   ABDOMINAL HYSTERECTOMY     BACK SURGERY     C5-6 decompression and fusion   bilateral oopherectomy     with posterior repair; perineoplasty   CATARACT EXTRACTION     CATARACT EXTRACTION W/PHACO  07/13/2012   Procedure: CATARACT EXTRACTION PHACO AND INTRAOCULAR LENS PLACEMENT (IOC);  Surgeon: Clay Cummins, MD;  Location: [facility];  Service: Ophthalmology;  Laterality: Right;  CDE:1.33   CATARACT EXTRACTION W/PHACO Left 01/11/2013   Procedure: CATARACT EXTRACTION PHACO AND INTRAOCULAR LENS PLACEMENT (IOC);  Surgeon: Clay Cummins, MD;  Location: [facility];  Service: Ophthalmology;  Laterality: Left;  CDE 0.00   CESAREAN SECTION     COLONOSCOPY  03/12/2012   Procedure: COLONOSCOPY;  Surgeon: Ruby Corporal, MD;  Location: [facility];  Service: Endoscopy;  Laterality: N/A;  1200   COLONOSCOPY WITH  PROPOFOL  N/A 04/18/2022   Procedure: COLONOSCOPY WITH PROPOFOL ;  Surgeon: Ruby Corporal, MD;  Location: [facility];  Service: Endoscopy;  Laterality: N/A;  730   ENDOVENOUS ABLATION SAPHENOUS VEIN W/ LASER  12/12/2011   left greater saphenous vein      ESOPHAGOGASTRODUODENOSCOPY N/A 06/01/2015   Procedure: ESOPHAGOGASTRODUODENOSCOPY (EGD);  Surgeon: Ruby Corporal, MD;  Location: [facility];  Service: Endoscopy;  Laterality: N/A;  255   NISSEN FUNDOPLICATION     REPLACEMENT TOTAL  KNEE BILATERAL     SEPTOPLASTY     SINOSCOPY     TONSILLECTOMY     TUBAL LIGATION      Family History: Family History  Problem Relation Age of Onset   Allergic rhinitis Mother    Diabetes Mother    Depression Mother    Diabetes type II Mother    Allergic rhinitis Father    Hypertension Father    Heart failure Father    Allergic rhinitis Sister    Migraines Sister    Other Sister        migranes   Depression Sister    Fibromyalgia Sister    Hypertension Sister    Allergic rhinitis Brother    Hypertension Brother    Asthma Maternal Grandmother    Cancer Paternal Grandmother    Allergic rhinitis Daughter    Allergic rhinitis Son    Asthma Child     Social History:  Flooring in bedroom: carpet Pets: dog Tobacco use/exposure: [phone]; 1/2 ppd or less  Job: [employer] nurse for [employer]  Medication List:  Allergies as of 03/15/2024       Reactions   Hydrocodone Itching   Oxycodone -Itching,    Aminophylline Rash   Bactrim [sulfamethoxazole-trimethoprim] Rash   Pt has taken before but DS causes rash. Single strength does not   Nystatin Rash   Penicillins Rash        Medication List        Accurate as of March 15, 2024  4:45 PM. If you have any questions, ask your nurse or doctor.          albuterol  (2.5 MG/3ML) 0.083% nebulizer solution Commonly known as: PROVENTIL  Take 3 mLs (2.5 mg total) by nebulization every 6 (six) hours as needed for wheezing or shortness of breath.   albuterol  108 (90 Base) MCG/ACT inhaler Commonly known as: VENTOLIN  HFA Inhale 2 puffs into the lungs every 6 (six) hours as needed. For shortness of breath   Breo Ellipta  200-25 MCG/ACT Aepb Generic drug: fluticasone  furoate-vilanterol USE 1 INHALATION BY MOUTH DAILY   buPROPion  150 MG 24 hr tablet Commonly known as: WELLBUTRIN  XL Take 450 mg by mouth daily.   CALCIUM 600 + D PO Take 1 tablet by mouth daily.   cetirizine  10 MG tablet Commonly known as: ZYRTEC  Take 10 mg by mouth  daily.   dexlansoprazole  60 MG capsule Commonly known as: DEXILANT  Take 1 capsule (60 mg total) by mouth daily.   fluconazole  150 MG tablet Commonly known as: DIFLUCAN  TAKE ONE TABLET BY MOUTH AS A ONE-TIME DOSE MAY REPEAT IN 3 DAYS IF SYMPTOMS PERSIST.   fluticasone  50 MCG/ACT nasal spray Commonly known as: FLONASE  Place 1 spray into the nose daily.   losartan 50 MG tablet Commonly known as: COZAAR Take 50 mg by mouth daily.   methocarbamol  500 MG tablet Commonly known as: ROBAXIN  Take 500 mg by mouth every 8 (eight) hours as needed for muscle spasms.   montelukast  10 MG  tablet Commonly known as: SINGULAIR  Take 10 mg by mouth at bedtime.   pantoprazole  40 MG tablet Commonly known as: PROTONIX  Take 40 mg by mouth daily.   rizatriptan 10 MG disintegrating tablet Commonly known as: MAXALT-MLT Take 10 mg by mouth as needed for migraine.   valACYclovir  500 MG tablet Commonly known as: VALTREX  TAKE 1 TABLET BY MOUTH DAILY FOR SUPPRESSION   Xiidra 5 % Soln Generic drug: Lifitegrast Place 1 drop into both eyes in the morning and at bedtime.         REVIEW OF SYSTEMS: Pertinent positives and negatives discussed in HPI.   Objective:   Physical Exam: BP 106/80   Pulse 99   Temp 98.7 F (37.1 C)   Ht 5' 3.39" (1.61 m)   Wt 165 lb 6.4 oz (75 kg)   SpO2 95%   BMI 28.94 kg/m  Body mass index is 28.94 kg/m. GEN: alert, well developed HEENT: clear conjunctiva,  nose with + mild inferior turbinate hypertrophy, pink nasal mucosa, slight clear rhinorrhea, no cobblestoning HEART: regular rate and rhythm, no murmur LUNGS: clear to auscultation bilaterally, no coughing, unlabored respiration ABDOMEN: soft, non distended  SKIN: no rashes or lesions  Spirometry:  Tracings reviewed. Her effort: Good reproducible efforts. FVC: 2.33L, 76% predicted FEV1: 1.81L, 75% predicted FEV1/FVC ratio: 78% Interpretation: Spirometry consistent with normal pattern.  Please see  scanned spirometry results for details.  Assessment:   1. Chronic rhinitis   2. Moderate persistent asthma without complication     Plan/Recommendations:  Moderate Persistent Asthma: - MDI technique discussed. Spirometry today was normal.  Reports feeling no worse without Fasenra  and doing okay on Breo/Singulair , will continue.  Can consider biologic if symptoms worsen.  Also discussed I do not give prednisone  to keep on hand.   - Maintenance inhaler: continue Breo 200-25mcg 1 puff daily and Singulair  10mg  daily.  - Rescue inhaler: Albuterol  2 puffs via spacer or 1 vial via nebulizer every 4-6 hours as needed for respiratory symptoms of cough, shortness of breath, or wheezing Asthma control goals:  Full participation in all desired activities (may need albuterol  before activity) Albuterol  use two times or less a week on average (not counting use with activity) Cough interfering with sleep two times or less a month Oral steroids no more than once a year No hospitalizations   Chronic Rhinitis: - Symptoms stable, not interested in retesting or AIT.  If symptoms worsen, consider allergy testing.   - Use nasal saline rinses before nose sprays such as with Neilmed Sinus Rinse.  Use distilled water .   - Use Flonase  2 sprays each nostril daily. Aim upward and outward. - Use Azelastine  2 sprays each nostril twice daily as needed for runny nose, drainage, sneezing, congestion. Aim upward and outward. - Use Zyrtec  10 mg daily.  - Use Singulair  10mg  daily.  Stop if there are any mood/behavioral changes.     Return in about 4 months (around 07/15/2024).  Kristen Petri, MD [facility] 

## 2024-03-16 NOTE — Addendum Note (Signed)
 Addended by: Corinda Dibbles on: 03/16/2024 12:21 PM   Modules accepted: Orders

## 2024-03-17 ENCOUNTER — Encounter: Payer: Self-pay | Admitting: Internal Medicine

## 2024-03-28 ENCOUNTER — Encounter: Payer: Self-pay | Admitting: Internal Medicine

## 2024-03-29 MED ORDER — PREDNISONE 10 MG PO TABS: ORAL_TABLET | ORAL | 0 refills | Status: DC

## 2024-03-29 NOTE — Telephone Encounter (Signed)
 Prednisone  10 mg take  4 each am x 2 days,   2 each am x 2 days,  1 each am x 2 days and stop   Have a safe trip!

## 2024-04-02 ENCOUNTER — Other Ambulatory Visit (HOSPITAL_COMMUNITY): Payer: Self-pay | Admitting: Internal Medicine

## 2024-04-02 DIAGNOSIS — Z1231 Encounter for screening mammogram for malignant neoplasm of breast: Secondary | ICD-10-CM

## 2024-04-22 ENCOUNTER — Encounter: Payer: Self-pay | Admitting: Internal Medicine

## 2024-04-22 ENCOUNTER — Telehealth: Payer: Self-pay | Admitting: Orthopedic Surgery

## 2024-04-22 NOTE — Telephone Encounter (Signed)
 DR. Phyllis Breeze   Patient called and request a refill for clindamycin  150 mg she has a dentist appt in a couple of weeks and if she does not need to take them please call her either way and let her know  [phone]   Pharmacy [facility]

## 2024-04-23 MED ORDER — CLINDAMYCIN HCL 150 MG PO CAPS: 600.0000 mg | ORAL_CAPSULE | Freq: Once | ORAL | 2 refills | Status: AC

## 2024-04-23 NOTE — Telephone Encounter (Signed)
 Yes she has bilateral knee replacements She said he is the only one doing this after 20 years. I advised her he can discuss with her in the office if she wants to stop taking the antibiotics offered appointment she declined.

## 2024-04-29 ENCOUNTER — Ambulatory Visit (HOSPITAL_COMMUNITY)

## 2024-05-03 ENCOUNTER — Ambulatory Visit (HOSPITAL_COMMUNITY)
Admission: RE | Admit: 2024-05-03 | Discharge: 2024-05-03 | Disposition: A | Discharge status: Home / Self Care | Source: Ambulatory Visit | Attending: Internal Medicine | Admitting: Internal Medicine

## 2024-05-03 DIAGNOSIS — Z1231 Encounter for screening mammogram for malignant neoplasm of breast: Secondary | ICD-10-CM | POA: Diagnosis not present

## 2024-05-04 ENCOUNTER — Other Ambulatory Visit: Payer: Self-pay | Admitting: Adult Health

## 2024-06-03 ENCOUNTER — Telehealth: Payer: Self-pay | Admitting: Adult Health

## 2024-06-03 NOTE — Telephone Encounter (Signed)
 Pt is having urinary symptoms. It's been over 3 years since pt was seen so she would have to see a provider. Pt is at work and don't get off until 5:00 pm. Advised can go to Urgent Care or call PCP. Pt states she will call PCP. JSY

## 2024-06-03 NOTE — Telephone Encounter (Signed)
 Patient calling stating that she is not able to send mychart msg but is wanting to know if you would send something to [facility] for a UTI states you can call or send mychart msg

## 2024-07-05 ENCOUNTER — Other Ambulatory Visit: Payer: Self-pay | Admitting: Pulmonary Disease

## 2024-07-06 NOTE — Telephone Encounter (Signed)
 Sorry, me and heather couldn't see anything out of [place] office for whatever reason that's why we question it.

## 2024-08-01 NOTE — Progress Notes (Unsigned)
 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 Status >mech vent age 69 and some better but still requiring abx/ pred 3-4 times per year since then rx [facility]/ [facility] dx dust/ mildew / animals on shots since around 2013 but referred by Dr Vinie 08/02/2013 to pulmonary clinic for refractory sob and abn ct chest c/w bronchiectasis.   08/02/2013 1st [name] visit/ [name] cc  2  mo  recurrrent resting paroxyms of sob somewhat different than before esp when speaking and no better with abx or prednisone . Assoc with  Unusual  Dry cough esp when lie down, no better with albuterol  but maintaining on qvar  and dulera. 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  Not aware of a problem while sleeping or in am's just immediately on lying down rec Take prilosec 30- 60 min before your first and last meals of the day (or one dexilant  60 before bfast)  and pepcid ac 20 mg at bedtime GERD  .  Take delsym two tsp every 12 hours and supplement if needed with  tramadol  50 mg up to 2 every 4 hours to suppress the urge to cough. Once you have eliminated the cough for 3 straight days try reducing the tramadol  first    08/30/2013 f/u ov/[name] re: cough ? Asthma vs uacs Chief Complaint  Patient presents with   Follow-up    Pt states that her breathing is much improved since the last visit. Her cough had resolved, but had to recently start back on delsym and tramadol  since weather change.  on dulera and qvar  not limited by sob and no saba need at all Rec Try off dulera - if symptoms worsen resume dulera immediately  Work on Musician technique:   Ok to use  delsym as much as you want but if you need tramadol  regularly we will need to see you back here for further evaluation as the delsym should be adequate   02/04/2024  f/u ov/[name]/[name] re: ? Cough variant asthma  maint on BREO  200/ singulair  / prednisone  up to 3 course per year  Chief Complaint  Patient presents with   Follow-up    New patient former patient of Dr Shellia   Dyspnea:  mainly walking x 15 min / taking care of her mother   Cough: not much  Sleeping: bed is flat/ one pillow s resp cc  SABA use: 2-3 x per week  hfa/ rarely neb  02: none  Rec No change in medications  Also  Ok to try albuterol  15 min before an activity (on alternating days)  that you know would usually make you short of breath  Please schedule a follow up visit in 6 months but call sooner if needed   Allergy screen 02/04/2024 >  Eos 0.5 /  IgE  98   08/03/2024  f/u ov/[name]/[name] re: ? Cough variant asthma  maint on singulair  and BREO 200  bid due to flare x several weeks with cough and  chest congestion  Chief Complaint  Patient presents with   Asthma    Shob worse - cough   Dyspnea:  walks 15-20 min more short of breath since weather 2-3 x weeks prior  Cough: minimal mucoid production but freq throat clearing always needs prednsione  Sleeping:  up to 30 degrees one pillow  s resp cc noct  SABA use: could not tell any difference  02: none     No obvious day to day or daytime variability or assoc excess/ purulent sputum or mucus plugs or hemoptysis or cp or chest tightness, subjective wheeze or overt sinus or hb symptoms.    Also denies any obvious fluctuation of symptoms with weather or environmental changes or other aggravating or alleviating factors except as outlined above   No unusual exposure hx or h/o childhood pna  or knowledge of premature birth.  Current Allergies, Complete Past Medical History, Past Surgical History, Family History, and Social History were reviewed in  [name] record.  ROS  The following are not active complaints unless bolded Hoarseness, sore throat, dysphagia, dental problems, itching, sneezing,  nasal congestion or discharge of excess mucus or purulent secretions, ear ache,   fever, chills, sweats, unintended wt loss or wt gain, classically pleuritic or exertional cp,  orthopnea pnd or arm/hand swelling  or leg swelling, presyncope, palpitations, abdominal pain, anorexia, nausea, vomiting, diarrhea  or change in bowel habits or change in bladder habits, change in stools or change in urine, dysuria, hematuria,  rash, arthralgias, visual complaints, headache, numbness, weakness or ataxia or problems with walking or coordination,  change in mood or  memory.        Current Meds  Medication Sig   albuterol  (PROVENTIL ) (2.5 MG/3ML) 0.083% nebulizer solution Take 3 mLs (2.5 mg total) by nebulization every 6 (six) hours as needed for wheezing or shortness of breath.   albuterol  (VENTOLIN  HFA) 108 (90 Base) MCG/ACT inhaler Inhale 2 puffs into the lungs every 6 (six) hours as needed for shortness of breath or wheezing. For shortness of breath   azelastine  (ASTELIN ) 0.1 % nasal spray Place 2 sprays into both nostrils 2 (two) times daily as needed for allergies or rhinitis. Use in each nostril as directed   buPROPion  (WELLBUTRIN  XL) 150 MG 24 hr tablet Take 450 mg by mouth daily.   Calcium Carb-Cholecalciferol (CALCIUM 600 + D PO) Take 1 tablet by mouth daily.   cetirizine  (ZYRTEC ) 10 MG tablet Take 1 tablet (10 mg total) by mouth daily.   dexlansoprazole  (DEXILANT ) 60 MG capsule Take 1 capsule (60 mg total) by mouth daily.   fluconazole  (DIFLUCAN ) 150 MG tablet TAKE 1 TABLET BY MOUTH AS A ONE-TIME DOSE MAY REPEAT IN 3 DAYS IF SYMPTOMS PERSIST.   fluticasone  (FLONASE ) 50 MCG/ACT nasal spray Place 2 sprays into both nostrils daily.   fluticasone  furoate-vilanterol (BREO ELLIPTA ) 200-25 MCG/ACT AEPB Inhale 1 puff into the lungs  daily.   losartan (COZAAR) 50 MG tablet Take 50 mg by mouth daily.   methocarbamol  (ROBAXIN ) 500 MG tablet Take 500 mg by mouth every 8 (eight) hours as needed for muscle spasms.   montelukast  (SINGULAIR ) 10 MG tablet Take 1 tablet (10 mg total) by mouth at bedtime.   MOUNJARO 10 MG/0.5ML Pen Inject 10 mg into the skin once a week.   pantoprazole  (PROTONIX ) 40 MG tablet Take 40 mg by mouth daily.   predniSONE  (DELTASONE ) 10 MG tablet TAKE 4 TABS X2 DAYS; TAKE 2 TABS X2 DAYS;  TAKE 1 TAB X2 DAYS; STOP   rizatriptan (MAXALT-MLT) 10 MG disintegrating tablet Take 10 mg by mouth as needed for migraine.   valACYclovir  (VALTREX ) 500 MG tablet TAKE 1 TABLET BY MOUTH DAILY FOR SUPPRESSION   XIIDRA 5 % SOLN Place 1 drop into both eyes in the morning and at bedtime.                  Objective:   Physical Exam  Wt   08/03/2024         156  02/04/2024       164   08/30/2013       169     08/02/13 177 lb 6.4 oz (80.468 kg)  06/17/13 174 lb (78.926 kg)  03/30/13 172 lb (78.019 kg)    Vital signs reviewed  08/03/2024  - Note at rest 02 sats  98% on RA   General appearance:    amb wf occ throat clearing    HEENT : Oropharynx  clear       NECK :  without  apparent JVD/ palpable Nodes/TM    LUNGS: no acc muscle use,  Nl contour chest  with distant exp wheeze bilaterally    CV:  RRR  no s3 or murmur or increase in P2, and no edema   ABD:  soft and nontender   MS:  Gait nl   ext warm without deformities Or obvious joint restrictions  calf tenderness, cyanosis or clubbing    SKIN: warm and dry without lesions    NEURO:  alert, approp, nl sensorium with  no motor or cerebellar deficits apparent.     Assessment & Plan:   Assessment & Plan Moderate asthma, unspecified whether complicated, unspecified whether persistent Onset in childhood/ failed to improve on allergy shots Dirk Gin)  -PFT 05/29/21 >> FEV1 2.11 (80%),  ratio 0.78, TLC 7.04 (139%), DLCO 77%  ERV 22% @ wt 178 and very  minimal curvature - started benralizumab  (fasenra ) 07/26/21  x one year but then denied by insurance as stopped Breo  10/2023  - Allergy screen 02/04/2024 >  Eos 0.5 /  IgE  98 - Spirometry 08/02/24 FEV1 80% with ratio 0.61 and classic concavity on f/v exp loop  - air supra rx 08/03/2024 >>>   She says always needs prednisone  to break these spells but has never tried air Supra and my concern with just doubing the BREO 200 in increased irritability of the upper airway so rec  Max gerd rx with ppi ac and pepcid q pm to reduce risk of cyclical throat clearing Airsupra  up to 12 puffs in 24 hours  Prednisone   x 6 days if air supra not effective or well tol in max doses    F/u 3 months - call sooner if needed           Each maintenance medication was reviewed in detail including emphasizing most importantly the difference between maintenance and prns and under what circumstances the prns are to be triggered using an action plan format where appropriate.  Total time for H and P, chart review, counseling, reviewing dpi/elipta/hfa /neb device(s) and generating customized AVS unique to this [name] visit / same day charting = 32 min          AVS  Patient Instructions  Until better >>> pepcid 20 mg after summer   Stop albuterol    Start Air supra  2 puffs every few hours as needed until get relief but no more than  12 puffs x 24 hours   If no better :  Prednisone  10 mg take  4 each am x 2 days,   2 each am x 2 days,  1 each am x 2 days and stop    Please schedule a follow up visit in 3 months but call sooner if needed         Ozell America, MD 08/03/2024

## 2024-08-02 ENCOUNTER — Encounter: Payer: Self-pay | Admitting: Internal Medicine

## 2024-08-02 ENCOUNTER — Other Ambulatory Visit: Payer: Self-pay

## 2024-08-02 ENCOUNTER — Ambulatory Visit (INDEPENDENT_AMBULATORY_CARE_PROVIDER_SITE_OTHER): Admitting: Internal Medicine

## 2024-08-02 VITALS — BP 118/68 | HR 106 | Temp 97.7°F | Resp 18 | Ht 65.35 in | Wt 154.6 lb

## 2024-08-02 DIAGNOSIS — J310 Chronic rhinitis: Secondary | ICD-10-CM | POA: Diagnosis not present

## 2024-08-02 DIAGNOSIS — J4540 Moderate persistent asthma, uncomplicated: Secondary | ICD-10-CM | POA: Diagnosis not present

## 2024-08-02 MED ORDER — FLUTICASONE PROPIONATE 50 MCG/ACT NA SUSP: 2.0000 | Freq: Every day | NASAL | 5 refills | Status: AC

## 2024-08-02 MED ORDER — ALBUTEROL SULFATE (2.5 MG/3ML) 0.083% IN NEBU: 2.5000 mg | INHALATION_SOLUTION | Freq: Four times a day (QID) | RESPIRATORY_TRACT | 5 refills | Status: AC | PRN

## 2024-08-02 MED ORDER — ALBUTEROL SULFATE HFA 108 (90 BASE) MCG/ACT IN AERS: 2.0000 | INHALATION_SPRAY | Freq: Four times a day (QID) | RESPIRATORY_TRACT | 1 refills | Status: DC | PRN

## 2024-08-02 MED ORDER — MONTELUKAST SODIUM 10 MG PO TABS: 10.0000 mg | ORAL_TABLET | Freq: Every day | ORAL | 1 refills | Status: AC

## 2024-08-02 MED ORDER — AZELASTINE HCL 0.1 % NA SOLN: 2.0000 | Freq: Two times a day (BID) | NASAL | 5 refills | Status: AC | PRN

## 2024-08-02 MED ORDER — FLUTICASONE FUROATE-VILANTEROL 200-25 MCG/ACT IN AEPB: 1.0000 | INHALATION_SPRAY | Freq: Every day | RESPIRATORY_TRACT | 3 refills | Status: AC

## 2024-08-02 MED ORDER — CETIRIZINE HCL 10 MG PO TABS: 10.0000 mg | ORAL_TABLET | Freq: Every day | ORAL | 1 refills | Status: AC

## 2024-08-02 NOTE — Addendum Note (Signed)
 Addended by: MENDEZ-MUNGARAY, Yehudis Monceaux M on: 08/02/2024 05:50 PM   Modules accepted: Orders

## 2024-08-02 NOTE — Progress Notes (Signed)
 FOLLOW UP Date of Service/Encounter:  08/02/24   Subjective:  [name] (DOB: 1966/11/19) is a 58 y.o. female [facility] for follow up for asthma and chronic rhinitis.    History obtained from: chart review and patient. Last visit was with me on 03/15/2024 and discussed continuation of Breo/Singulair  for asthma; previously was on Fasenra  but since stopping it, has noted no difference/worsening.  Also using Flonase /Azelastine /Zyrtec /Singulair  and done AIT multiple times without improvement.  Reports asthma was doing well until this week, has noted some increased chest tightness but no dyspnea/wheezing/cough.  No illness.  Does have twin grandbabies.  In June, reports getting sick with an URI and was started on abx and steroids.  Taking Breo daily, Albuterol  infrequently for rescue only.  Does note on and off congestion/drainage but nothing too bothersome.  Using Flonase /Azelastine  and Zyrtec  daily.  She is not interested in retesting or AIT at this time.   Past Medical History: Past Medical History:  Diagnosis Date   Allergy    Asthma    dxed at age 20   Cataracts, bilateral    Constipation 10/29/2013   Cystocele 10/29/2013   Depression    GERD (gastroesophageal reflux disease)    Hemorrhoid 01/12/2015   Herpes simplex virus (HSV) infection    Migraine    last migraine:01/06/13   Migraines 03/30/2013   Rectocele 10/29/2013   Restless leg syndrome    Shortness of breath    SOB with exertion   SUI (stress urinary incontinence, female)    Vaginal dryness 04/05/2016   Vaginal pain 04/05/2016   Varicose veins     Objective:  BP 118/68 (BP Location: Left Arm, Patient Position: Sitting, Cuff Size: Normal)   Pulse (!) 106   Temp 97.7 F (36.5 C) (Temporal)   Resp 18   Ht 5' 5.35 (1.66 m)   Wt 154 lb 9.6 oz (70.1 kg)   SpO2 96%   BMI 25.45 kg/m  Body mass index is 25.45 kg/m. Physical Exam: GEN: alert, well developed HEENT:  clear conjunctiva, nose with mild inferior turbinate hypertrophy, pink nasal mucosa, slight clear rhinorrhea HEART: regular rate and rhythm, no murmur LUNGS: clear to auscultation bilaterally, no coughing, unlabored respiration SKIN: no rashes or lesions  Spirometry:  Tracings reviewed. Her effort: It was hard to get consistent efforts and there is a question as to whether this reflects a maximal maneuver. FVC: 3.14L, 103% predicted  FEV1: 1.91L, 79% predicted FEV1/FVC ratio: 61% Interpretation: Spirometry consistent with mild obstructive disease.  Please see scanned spirometry results for details.  Assessment:   1. Chronic rhinitis   2. Moderate persistent asthma without complication     Plan/Recommendations:  Moderate Persistent Asthma: - Discussed keeping track of symptoms and if persistent chest tightness, will escalate therapy to triple inhaler to see if this helps.  Spirometry today with some obstruction.   - Maintenance inhaler: continue Breo 200-25mcg 1 puff daily and Singulair  10mg  daily.  - With respiratory illness or flare ups, start Breo 200-25mcg 1 puff twice daily for 1-2 weeks.  - Rescue inhaler: Albuterol  2 puffs via spacer or 1 vial via nebulizer every 4-6 hours as needed for respiratory symptoms of cough, shortness of breath, or wheezing Asthma control goals:  Full participation in all desired activities (may need albuterol  before activity) Albuterol  use two times or less a week on average (not counting use with activity) Cough interfering with sleep two times or less a  month Oral steroids no more than once a year No hospitalizations   Chronic Rhinitis: - Controlled. If symptoms worsen, consider allergy testing.   - Use nasal saline rinses before nose sprays such as with Neilmed Sinus Rinse.  Use distilled water .   - Use Flonase  2 sprays each nostril daily. Aim upward and outward. - Use Azelastine  2 sprays each nostril twice daily as needed for runny nose,  drainage, sneezing, congestion. Aim upward and outward. - Use Zyrtec  10 mg daily.  - Use Singulair  10mg  daily.  Stop if there are any mood/behavioral changes.    Return in about 4 months (around 12/02/2024).  Arleta Blanch, MD [facility] 

## 2024-08-02 NOTE — Patient Instructions (Addendum)
 Moderate Persistent Asthma: - Maintenance inhaler: continue Breo 200-25mcg 1 puff daily and Singulair  10mg  daily.  - With respiratory illness or flare ups, start Breo 200-25mcg 1 puff twice daily for 1-2 weeks.  - Rescue inhaler: Albuterol  2 puffs via spacer or 1 vial via nebulizer every 4-6 hours as needed for respiratory symptoms of cough, shortness of breath, or wheezing Asthma control goals:  Full participation in all desired activities (may need albuterol  before activity) Albuterol  use two times or less a week on average (not counting use with activity) Cough interfering with sleep two times or less a month Oral steroids no more than once a year No hospitalizations   Chronic Rhinitis: - If symptoms worsen, consider allergy testing.   - Use nasal saline rinses before nose sprays such as with Neilmed Sinus Rinse.  Use distilled water .   - Use Flonase  2 sprays each nostril daily. Aim upward and outward. - Use Azelastine  2 sprays each nostril twice daily as needed for runny nose, drainage, sneezing, congestion. Aim upward and outward. - Use Zyrtec  10 mg daily.  - Use Singulair  10mg  daily.  Stop if there are any mood/behavioral changes.

## 2024-08-03 ENCOUNTER — Encounter: Payer: Self-pay | Admitting: Internal Medicine

## 2024-08-03 ENCOUNTER — Ambulatory Visit: Admitting: Internal Medicine

## 2024-08-03 VITALS — BP 113/78 | HR 90 | Ht 65.0 in | Wt 156.4 lb

## 2024-08-03 DIAGNOSIS — F17210 Nicotine dependence, cigarettes, uncomplicated: Secondary | ICD-10-CM

## 2024-08-03 DIAGNOSIS — J4540 Moderate persistent asthma, uncomplicated: Secondary | ICD-10-CM | POA: Diagnosis not present

## 2024-08-03 DIAGNOSIS — J45909 Unspecified asthma, uncomplicated: Secondary | ICD-10-CM

## 2024-08-03 MED ORDER — PREDNISONE 10 MG PO TABS: ORAL_TABLET | ORAL | 0 refills | Status: DC

## 2024-08-03 MED ORDER — AIRSUPRA 90-80 MCG/ACT IN AERO
2.0000 | INHALATION_SPRAY | RESPIRATORY_TRACT | 11 refills | Status: AC | PRN
Start: 2024-08-03 — End: ?

## 2024-08-03 NOTE — Patient Instructions (Addendum)
 Until better >>> pepcid 20 mg after summer   Stop albuterol    Start Air supra  2 puffs every few hours as needed until get relief but no more than 12 puffs x 24 hours   If no better :  Prednisone  10 mg take  4 each am x 2 days,   2 each am x 2 days,  1 each am x 2 days and stop    Please schedule a follow up visit in 3 months but call sooner if needed

## 2024-08-03 NOTE — Assessment & Plan Note (Addendum)
 Onset in childhood/ failed to improve on allergy shots Dirk Gin)  -PFT 05/29/21 >> FEV1 2.11 (80%),  ratio 0.78, TLC 7.04 (139%), DLCO 77%  ERV 22% @ wt 178 and very minimal curvature - started benralizumab  (fasenra ) 07/26/21  x one year but then denied by insurance as stopped Breo  10/2023  - Allergy screen 02/04/2024 >  Eos 0.5 /  IgE  98 - Spirometry 08/02/24 FEV1 80% with ratio 0.61 and classic concavity on f/v exp loop  - air supra rx 08/03/2024 >>>   She says always needs prednisone  to break these spells but has never tried air Supra and my concern with just doubing the BREO 200 in increased irritability of the upper airway so rec  Max gerd rx with ppi ac and pepcid q pm to reduce risk of cyclical throat clearing Airsupra  up to 12 puffs in 24 hours  Prednisone   x 6 days if air supra not effective or well tol in max doses    F/u 3 months - call sooner if needed           Each maintenance medication was reviewed in detail including emphasizing most importantly the difference between maintenance and prns and under what circumstances the prns are to be triggered using an action plan format where appropriate.  Total time for H and P, chart review, counseling, reviewing dpi/elipta/hfa /neb device(s) and generating customized AVS unique to this office visit / same day charting = 32 min

## 2024-08-16 ENCOUNTER — Encounter: Admitting: Adult Health

## 2024-08-26 ENCOUNTER — Encounter: Payer: Self-pay | Admitting: Adult Health

## 2024-08-26 ENCOUNTER — Ambulatory Visit (INDEPENDENT_AMBULATORY_CARE_PROVIDER_SITE_OTHER): Admitting: Adult Health

## 2024-08-26 VITALS — BP 124/81 | HR 101 | Ht 63.5 in | Wt 153.5 lb

## 2024-08-26 DIAGNOSIS — Z90710 Acquired absence of both cervix and uterus: Secondary | ICD-10-CM

## 2024-08-26 DIAGNOSIS — Z1331 Encounter for screening for depression: Secondary | ICD-10-CM

## 2024-08-26 DIAGNOSIS — Z01419 Encounter for gynecological examination (general) (routine) without abnormal findings: Secondary | ICD-10-CM

## 2024-08-26 MED ORDER — FLUCONAZOLE 150 MG PO TABS: ORAL_TABLET | ORAL | 2 refills | Status: DC

## 2024-08-26 NOTE — Progress Notes (Signed)
 Patient ID: [name], female   DOB: 20-Jul-1966, 58 y.o.   MRN: [id number] History of Present Illness: [name] is a 58 year old white female, married, sp hysterectomy in for a well woman gyn exam. She is retiring from [employer] 09/03/24. She has new twin grandsons, by daughter and another boy on the way with her son.  She requests refill on diflucan .  PCP is Dr Sheryle    Current Medications, Allergies, Past Medical History, Past Surgical History, Family History and Social History were reviewed in [employer] electronic medical record.     Review of Systems: Patient denies any headaches, hearing loss, fatigue, blurred vision, shortness of breath, chest pain, abdominal pain, problems with bowel movements, urination, or intercourse. No joint pain or mood swings.  She has lost about 33 lbs, 20 with mounjaro, started when A1c hit 6.5   Physical Exam:BP 124/81 (BP Location: Left Arm, Patient Position: Sitting, Cuff Size: Normal)   Pulse (!) 101   Ht 5' 3.5 (1.613 m)   Wt 153 lb 8 oz (69.6 kg)   BMI 26.76 kg/m   General:  Well developed, well nourished, no acute distress Skin:  Warm and dry Neck:  Midline trachea, normal thyroid , good ROM, no lymphadenopathy, no carotid bruits heard Lungs; Clear to auscultation bilaterally Breast:  No dominant palpable mass, retraction, or nipple discharge Cardiovascular: Regular rate and rhythm Abdomen:  Soft, non tender, no hepatosplenomegaly Pelvic:  External genitalia is normal in appearance, no lesions.  The vagina is pale. Urethra has no lesions or masses. The cervix and uterus are absent. No adnexal masses or tenderness noted.Bladder is non tender, no masses felt. Rectal:Deferred Extremities/musculoskeletal:  No swelling or varicosities noted, no clubbing or cyanosis Psych:  No mood changes, alert and cooperative,seems happy AA is 1 Fall risk is low    08/26/2024    2:36 PM 10/30/2020    3:47 PM 09/14/2019    8:12 AM  Depression screen PHQ 2/9   Decreased Interest 0 0 0  Down, Depressed, Hopeless 0 0 0  PHQ - 2 Score 0 0 0  Altered sleeping 1    Tired, decreased energy 0    Change in appetite 0    Feeling bad or failure about yourself  0    Trouble concentrating 0    Moving slowly or fidgety/restless 0    Suicidal thoughts 0    PHQ-9 Score 1         08/26/2024    2:36 PM  GAD 7 : Generalized Anxiety Score  Nervous, Anxious, on Edge 0  Control/stop worrying 0  Worry too much - different things 0  Trouble relaxing 0  Restless 0  Easily annoyed or irritable 0  Afraid - awful might happen 0  Total GAD 7 Score 0      Upstream - 08/26/24 1432       Pregnancy Intention Screening   Does the patient want to become pregnant in the next year? N/A    Does the patient's partner want to become pregnant in the next year? N/A    Would the patient like to discuss contraceptive options today? N/A      Contraception Wrap Up   Current Method Female Sterilization   hyst   End Method Female Sterilization   hyst   Contraception Counseling Provided No         Examination chaperoned by Bethany Salt LPN  Impression and plan: 1. Encounter for well woman exam with routine gynecological  exam (Primary) Physical in 1 year Labs with PCP Mammogram was negative 05/03/24 Colonoscopy per GI Refilled diflucan  at her request   Meds ordered this encounter  Medications   fluconazole  (DIFLUCAN ) 150 MG tablet    Sig: TAKE 1 TABLET BY MOUTH AS A ONE-TIME DOSE MAY REPEAT IN 3 DAYS IF SYMPTOMS PERSIST.    Dispense:  2 tablet    Refill:  2    Supervising Provider:   JAYNE MINDER H [[id number]]     2. S/P hysterectomy

## 2024-11-01 ENCOUNTER — Ambulatory Visit: Admitting: Internal Medicine

## 2024-11-01 ENCOUNTER — Encounter: Payer: Self-pay | Admitting: Internal Medicine

## 2024-11-01 VITALS — BP 118/79 | HR 79 | Ht 63.5 in | Wt 144.0 lb

## 2024-11-01 DIAGNOSIS — J310 Chronic rhinitis: Secondary | ICD-10-CM | POA: Insufficient documentation

## 2024-11-01 DIAGNOSIS — J45909 Unspecified asthma, uncomplicated: Secondary | ICD-10-CM | POA: Diagnosis not present

## 2024-11-01 MED ORDER — PREDNISONE 10 MG PO TABS: ORAL_TABLET | ORAL | 5 refills | Status: AC

## 2024-11-01 NOTE — Progress Notes (Signed)
 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 Status >mech vent age 58 and some better but still requiring abx/ pred 3-4 times per year since then rx [facility]/ [facility] dx dust/ mildew / animals on shots since around 2013 but referred by Dr Vinie 08/02/2013 to pulmonary clinic for refractory sob and abn ct chest c/w bronchiectasis.   08/02/2013 1st [facility] office visit/ Lynelle Weiler cc  2  mo  recurrrent resting paroxyms of sob somewhat different than before esp when speaking and no better with abx or prednisone . Assoc with  Unusual  Dry cough esp when lie down, no better with albuterol  but maintaining on qvar  and dulera. 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  Not aware of a problem while sleeping or in am's just immediately on lying down rec Take prilosec 30- 60 min before your first and last meals of the day (or one dexilant  60 before bfast)  and pepcid ac 20 mg at bedtime GERD  .  Take delsym two tsp every 12 hours and supplement if needed with  tramadol  50 mg up to 2 every 4 hours to suppress the urge to cough. Once you have eliminated the cough for 3 straight days try reducing the tramadol  first    08/30/2013 f/u ov/Wylee Dorantes re: cough ? Asthma vs uacs Chief Complaint  Patient presents with   Follow-up    Pt states that her breathing is much improved since the last visit. Her cough had resolved, but had to recently start back on delsym and tramadol  since weather change.  on dulera and qvar  not limited by sob and no saba need at all Rec Try off dulera - if symptoms worsen resume dulera immediately  Work on musician technique:   Ok to use  delsym as much as you want but if you need tramadol  regularly we will need to see you back here for further evaluation as the delsym should be adequate   02/04/2024  f/u ov/[place] office/Bhavin Monjaraz re: ? Cough variant asthma  maint on BREO  200/ singulair  / prednisone  up to 3 course per year  Chief Complaint  Patient presents with   Follow-up    New patient former patient of Dr Shellia   Dyspnea:  mainly walking x 15 min / taking care of her mother   Cough: not much  Sleeping: bed is flat/ one pillow s resp cc  SABA use: 2-3 x per week  hfa/ rarely neb  02: none  Rec No change in medications  Also  Ok to try albuterol  15 min before an activity (on alternating days)  that you know would usually make you short of breath  Please schedule a follow up visit in 6 months but call sooner if needed   Allergy screen 02/04/2024 >  Eos 0.5 /  IgE  98   08/03/2024  f/u ov/[place] office/Coree Brame re: ? Cough variant asthma  maint on singulair  and BREO 200  bid due to flare x several weeks with cough and  chest congestion  Chief Complaint  Patient presents with   Asthma    Shob worse - cough   Dyspnea:  walks 15-20 min more short of breath since weather 2-3 x weeks prior  Cough: minimal mucoid production but freq throat clearing always needs prednsione  Sleeping:  up to 30 degrees one pillow  s resp cc noct  SABA use: could not tell any difference  02: none    Patient Instructions  Until better >>> pepcid 20 mg after summer  Stop albuterol   Start Air supra  2 puffs every few hours as needed until get relief but no more than 12 puffs x 24 hours  If no better :  Prednisone  10 mg take  4 each am x 2 days,   2 each am x 2 days,  1 each am x 2 days and stop      11/01/2024  f/u ov/[place] office/Cameren Earnest re: asthma  maint on BREO singulair  and airsupra    - no longer has any prednisone  for prn use Chief Complaint  Patient presents with   Asthma    F/u   Dyspnea:   Not limited by breathing from desired  activities   Cough: none / some sneezing  Sleeping: able to sleep flat one pillow s    resp cc  SABA use: minimal  air supra no neb  02: none    No obvious day to day or daytime variability or assoc excess/ purulent sputum or mucus plugs or hemoptysis or cp or chest tightness, subjective wheeze or overt  hb symptoms.    Also denies any obvious fluctuation of symptoms with weather or environmental changes or other aggravating or alleviating factors except as outlined above   No unusual exposure hx or h/o childhood pna/ asthma or knowledge of premature birth.  Current Allergies, Complete Past Medical History, Past Surgical History, Family History, and Social History were reviewed in [name] record.  ROS  The following are not active complaints unless bolded Hoarseness, sore throat, dysphagia, dental problems, itching, sneezing,  nasal congestion or discharge of excess mucus or purulent secretions, ear ache,   fever, chills, sweats, unintended wt loss or wt gain, classically pleuritic or exertional cp,  orthopnea pnd or arm/hand swelling  or leg swelling, presyncope, palpitations, abdominal pain, anorexia, nausea, vomiting, diarrhea  or change in bowel habits or change in bladder habits, change in stools or change in urine, dysuria, hematuria,  rash, arthralgias, visual complaints, headache, numbness, weakness or ataxia or problems with walking or coordination,  change in mood or  memory.        Current Meds  Medication Sig   albuterol  (PROVENTIL ) (2.5 MG/3ML) 0.083% nebulizer solution Take 3 mLs (2.5 mg total) by nebulization every 6 (six) hours as needed for wheezing or shortness of breath.   Albuterol -Budesonide (AIRSUPRA ) 90-80 MCG/ACT AERO Inhale 2 puffs into the lungs every 4 (four) hours as needed.   azelastine  (ASTELIN ) 0.1 % nasal spray Place 2 sprays into both nostrils 2 (two) times daily as needed for allergies or rhinitis. Use in each nostril as directed    buPROPion  (WELLBUTRIN  XL) 150 MG 24 hr tablet Take 450 mg by mouth daily. (Patient taking differently: Take 150 mg by mouth in the morning and at bedtime.)   cetirizine  (ZYRTEC ) 10 MG tablet Take 1 tablet (10 mg total) by mouth daily.   fluconazole  (DIFLUCAN ) 150 MG tablet TAKE 1 TABLET BY MOUTH AS A ONE-TIME DOSE MAY REPEAT IN 3  DAYS IF SYMPTOMS PERSIST.   fluticasone  (FLONASE ) 50 MCG/ACT nasal spray Place 2 sprays into both nostrils daily.   fluticasone  furoate-vilanterol (BREO ELLIPTA ) 200-25 MCG/ACT AEPB Inhale 1 puff into the lungs daily.   losartan (COZAAR) 50 MG tablet Take 50 mg by mouth daily.   methocarbamol  (ROBAXIN ) 500 MG tablet Take 500 mg by mouth every 8 (eight) hours as needed for muscle spasms.   montelukast  (SINGULAIR ) 10 MG tablet Take 1 tablet (10 mg total) by mouth at bedtime.   MOUNJARO 10 MG/0.5ML Pen Inject 10 mg into the skin once a week.   pantoprazole  (PROTONIX ) 40 MG tablet Take 40 mg by mouth daily.   rizatriptan (MAXALT-MLT) 10 MG disintegrating tablet Take 10 mg by mouth as needed for migraine.   valACYclovir  (VALTREX ) 500 MG tablet TAKE 1 TABLET BY MOUTH DAILY FOR SUPPRESSION   XIIDRA 5 % SOLN Place 1 drop into both eyes in the morning and at bedtime.         Past Medical History:  Diagnosis Date   Allergy    Asthma    dxed at age 66   Cataracts, bilateral    Constipation 10/29/2013   Cystocele 10/29/2013   Depression    Diabetes mellitus without complication (HCC)    GERD (gastroesophageal reflux disease)    Hemorrhoid 01/12/2015   Herpes simplex virus (HSV) infection    Migraine    last migraine:01/06/13   Migraines 03/30/2013   Rectocele 10/29/2013   Restless leg syndrome    Shortness of breath    SOB with exertion   SUI (stress urinary incontinence, female)    Vaginal dryness 04/05/2016   Vaginal pain 04/05/2016   Varicose veins              Objective:   Physical Exam  Wt   11/01/2024       144  08/03/2024         156  02/04/2024        164   08/30/2013       169     08/02/13 177 lb 6.4 oz (80.468 kg)  06/17/13 174 lb (78.926 kg)  03/30/13 172 lb (78.019 kg)     Vital signs reviewed  11/01/2024  - Note at rest 02 sats  98% on RA    General appearance:    pleasant amb wf nad     HEENT : Oropharynx  clear      Nasal turbinates mod edema/ no def purulence or polyps but some crusting    NECK :  without  apparent JVD/ palpable Nodes/TM    LUNGS: no acc muscle use,  Nl contour chest which is clear to A and P bilaterally without cough on insp or exp maneuvers   CV:  RRR  no s3 or murmur or increase in P2, and no edema   ABD:  soft and nontender   MS:  Gait nl   ext warm without deformities Or obvious joint restrictions  calf tenderness, cyanosis or clubbing    SKIN: warm and dry without lesions    NEURO:  alert, approp, nl sensorium with  no motor or cerebellar deficits apparent.      Assessment & Plan:   Assessment & Plan Moderate asthma, unspecified whether complicated, unspecified whether persistent Onset in childhood/ failed to improve on allergy shots Dirk Gin)  -PFT 05/29/21 >> FEV1 2.11 (80%),  ratio 0.78, TLC 7.04 (139%), DLCO 77%  ERV 22% @ wt 178 and very minimal  curvature - started benralizumab  (fasenra ) 07/26/21  x one year but then denied by insurance as stopped Breo  10/2023  - Allergy screen 02/04/2024 >  Eos 0.5 /  IgE  98 - Spirometry 08/02/24 FEV1 80% with ratio 0.61 and classic concavity on f/v exp loop  - air supra rx 08/03/2024 >>>  - 11/01/2024  After extensive coaching inhaler device,  effectiveness =    95%  hfa  and dpi > f/u can be yearly   All goals of chronic asthma control met including optimal function and elimination of symptoms with minimal need for rescue therapy.  Contingencies discussed in full including usie of 6d prednisone   refillable if increase need for neb over baseline which is presently @  zero.   >>> f/u can be yearly and allergy f/u for this problem is no longer  needed/ advised.   Rhinitis, chronic Symptom improve while on prednisone  but flonase  tending to cause nasal irritation so rec  >>> NS prn and try off flonase  short term to see if symptoms are better / worse  >>>  continue zyrtec  and astelin  and singulair    >>> f/u allergy prn poorly controlled rhinitis      Each maintenance medication was reviewed in detail including emphasizing most importantly the difference between maintenance and prns and under what circumstances the prns are to be triggered using an action plan format where appropriate.  Total time for H and P, chart review, counseling, reviewing hfa/ dpi/nasal sprary  device(s) and generating customized AVS unique to this office visit / same day charting = 31  min summary f/u ov          AVS  Patient Instructions  Prednisone  is refillable x 6 days if needing your nebulizer   Please schedule a follow up visit in 12  months but call sooner if needed    Ozell America, MD 11/01/2024

## 2024-11-01 NOTE — Assessment & Plan Note (Addendum)
 Onset in childhood/ failed to improve on allergy shots Dirk Gin)  -PFT 05/29/21 >> FEV1 2.11 (80%),  ratio 0.78, TLC 7.04 (139%), DLCO 77%  ERV 22% @ wt 178 and very minimal curvature - started benralizumab  (fasenra ) 07/26/21  x one year but then denied by insurance as stopped Breo  10/2023  - Allergy screen 02/04/2024 >  Eos 0.5 /  IgE  98 - Spirometry 08/02/24 FEV1 80% with ratio 0.61 and classic concavity on f/v exp loop  - air supra rx 08/03/2024 >>>  - 11/01/2024  After extensive coaching inhaler device,  effectiveness =    95%  hfa  and dpi > f/u can be yearly   All goals of chronic asthma control met including optimal function and elimination of symptoms with minimal need for rescue therapy.  Contingencies discussed in full including usie of 6d prednisone   refillable if increase need for neb over baseline which is presently @  zero.   >>> f/u can be yearly and allergy f/u for this problem is no longer needed/ advised.

## 2024-11-01 NOTE — Assessment & Plan Note (Addendum)
 Symptom improve while on prednisone  but flonase  tending to cause nasal irritation so rec  >>> NS prn and try off flonase  short term to see if symptoms are better / worse  >>>  continue zyrtec  and astelin  and singulair    >>> f/u allergy prn poorly controlled rhinitis      Each maintenance medication was reviewed in detail including emphasizing most importantly the difference between maintenance and prns and under what circumstances the prns are to be triggered using an action plan format where appropriate.  Total time for H and P, chart review, counseling, reviewing hfa/ dpi/nasal sprary  device(s) and generating customized AVS unique to this office visit / same day charting = 31  min summary f/u ov

## 2024-11-01 NOTE — Patient Instructions (Addendum)
 Prednisone  is refillable x 6 days if needing your nebulizer   Please schedule a follow up visit in 12  months but call sooner if needed

## 2024-11-21 ENCOUNTER — Other Ambulatory Visit: Payer: Self-pay | Admitting: Adult Health

## 2024-11-29 ENCOUNTER — Ambulatory Visit: Admitting: Internal Medicine
